# Patient Record
Sex: Female | Born: 1940 | Race: White | Hispanic: No | Smoking: Former smoker
Health system: Southern US, Community
[De-identification: ages and names within clinical notes are randomized; demographics above are authoritative.]

## PROBLEM LIST (undated history)

## (undated) DIAGNOSIS — I341 Nonrheumatic mitral (valve) prolapse: Secondary | ICD-10-CM

## (undated) DIAGNOSIS — E7800 Pure hypercholesterolemia, unspecified: Secondary | ICD-10-CM

## (undated) DIAGNOSIS — K5790 Diverticulosis of intestine, part unspecified, without perforation or abscess without bleeding: Secondary | ICD-10-CM

## (undated) DIAGNOSIS — C189 Malignant neoplasm of colon, unspecified: Secondary | ICD-10-CM

## (undated) DIAGNOSIS — Z836 Family history of other diseases of the respiratory system: Secondary | ICD-10-CM

## (undated) DIAGNOSIS — Z800 Family history of malignant neoplasm of digestive organs: Secondary | ICD-10-CM

## (undated) DIAGNOSIS — H269 Unspecified cataract: Secondary | ICD-10-CM

## (undated) DIAGNOSIS — E119 Type 2 diabetes mellitus without complications: Secondary | ICD-10-CM

## (undated) DIAGNOSIS — D649 Anemia, unspecified: Secondary | ICD-10-CM

## (undated) DIAGNOSIS — C180 Malignant neoplasm of cecum: Secondary | ICD-10-CM

## (undated) DIAGNOSIS — R011 Cardiac murmur, unspecified: Secondary | ICD-10-CM

## (undated) DIAGNOSIS — Z803 Family history of malignant neoplasm of breast: Secondary | ICD-10-CM

## (undated) DIAGNOSIS — M1990 Unspecified osteoarthritis, unspecified site: Secondary | ICD-10-CM

## (undated) DIAGNOSIS — K439 Ventral hernia without obstruction or gangrene: Secondary | ICD-10-CM

## (undated) DIAGNOSIS — I10 Essential (primary) hypertension: Secondary | ICD-10-CM

## (undated) DIAGNOSIS — K769 Liver disease, unspecified: Secondary | ICD-10-CM

## (undated) DIAGNOSIS — E559 Vitamin D deficiency, unspecified: Secondary | ICD-10-CM

## (undated) DIAGNOSIS — Z86010 Personal history of colonic polyps: Secondary | ICD-10-CM

## (undated) DIAGNOSIS — K648 Other hemorrhoids: Secondary | ICD-10-CM

## (undated) DIAGNOSIS — K219 Gastro-esophageal reflux disease without esophagitis: Secondary | ICD-10-CM

## (undated) DIAGNOSIS — G4730 Sleep apnea, unspecified: Secondary | ICD-10-CM

## (undated) HISTORY — DX: Unspecified cataract: H26.9

## (undated) HISTORY — DX: Type 2 diabetes mellitus without complications: E11.9

## (undated) HISTORY — DX: Family history of malignant neoplasm of digestive organs: Z80.0

## (undated) HISTORY — DX: Family history of other diseases of the respiratory system: Z83.6

## (undated) HISTORY — PX: MOUTH SURGERY: SHX715

## (undated) HISTORY — DX: Malignant neoplasm of colon, unspecified: C18.9

## (undated) HISTORY — DX: Sleep apnea, unspecified: G47.30

## (undated) HISTORY — DX: Other hemorrhoids: K64.8

## (undated) HISTORY — PX: VAGINAL HYSTERECTOMY: SUR661

## (undated) HISTORY — DX: Pure hypercholesterolemia, unspecified: E78.00

## (undated) HISTORY — DX: Family history of malignant neoplasm of breast: Z80.3

## (undated) HISTORY — DX: Vitamin D deficiency, unspecified: E55.9

## (undated) HISTORY — DX: Cardiac murmur, unspecified: R01.1

## (undated) HISTORY — PX: COLON SURGERY: SHX602

## (undated) HISTORY — DX: Liver disease, unspecified: K76.9

## (undated) HISTORY — DX: Anemia, unspecified: D64.9

## (undated) HISTORY — PX: COLONOSCOPY: SHX174

## (undated) HISTORY — DX: Nonrheumatic mitral (valve) prolapse: I34.1

## (undated) HISTORY — DX: Ventral hernia without obstruction or gangrene: K43.9

## (undated) HISTORY — DX: Essential (primary) hypertension: I10

## (undated) HISTORY — DX: Gastro-esophageal reflux disease without esophagitis: K21.9

---

## 1993-12-03 HISTORY — PX: DENTAL SURGERY: SHX609

## 1998-03-24 ENCOUNTER — Ambulatory Visit (HOSPITAL_COMMUNITY): Admission: RE | Admit: 1998-03-24 | Discharge: 1998-03-24 | Payer: Self-pay | Admitting: Gastroenterology

## 1998-07-25 ENCOUNTER — Other Ambulatory Visit: Admission: RE | Admit: 1998-07-25 | Discharge: 1998-07-25 | Payer: Self-pay | Admitting: *Deleted

## 1999-06-23 ENCOUNTER — Other Ambulatory Visit: Admission: RE | Admit: 1999-06-23 | Discharge: 1999-06-23 | Payer: Self-pay | Admitting: *Deleted

## 1999-09-29 ENCOUNTER — Encounter: Admission: RE | Admit: 1999-09-29 | Discharge: 1999-09-29 | Payer: Self-pay | Admitting: Family Medicine

## 1999-09-29 ENCOUNTER — Encounter: Payer: Self-pay | Admitting: Family Medicine

## 2000-08-12 ENCOUNTER — Other Ambulatory Visit: Admission: RE | Admit: 2000-08-12 | Discharge: 2000-08-12 | Payer: Self-pay | Admitting: *Deleted

## 2002-01-12 ENCOUNTER — Ambulatory Visit (HOSPITAL_COMMUNITY): Admission: RE | Admit: 2002-01-12 | Discharge: 2002-01-12 | Payer: Self-pay | Admitting: Internal Medicine

## 2002-01-12 ENCOUNTER — Encounter (INDEPENDENT_AMBULATORY_CARE_PROVIDER_SITE_OTHER): Payer: Self-pay

## 2002-06-10 ENCOUNTER — Other Ambulatory Visit: Admission: RE | Admit: 2002-06-10 | Discharge: 2002-06-10 | Payer: Self-pay | Admitting: Radiology

## 2002-06-10 ENCOUNTER — Encounter: Admission: RE | Admit: 2002-06-10 | Discharge: 2002-06-10 | Payer: Self-pay | Admitting: Family Medicine

## 2002-06-10 ENCOUNTER — Encounter: Payer: Self-pay | Admitting: Family Medicine

## 2002-06-10 ENCOUNTER — Encounter (INDEPENDENT_AMBULATORY_CARE_PROVIDER_SITE_OTHER): Payer: Self-pay | Admitting: Specialist

## 2002-06-26 ENCOUNTER — Encounter: Payer: Self-pay | Admitting: Family Medicine

## 2002-06-26 ENCOUNTER — Encounter: Admission: RE | Admit: 2002-06-26 | Discharge: 2002-06-26 | Payer: Self-pay | Admitting: Family Medicine

## 2002-12-23 ENCOUNTER — Encounter: Payer: Self-pay | Admitting: Family Medicine

## 2002-12-23 ENCOUNTER — Encounter: Admission: RE | Admit: 2002-12-23 | Discharge: 2002-12-23 | Payer: Self-pay | Admitting: Family Medicine

## 2003-08-24 ENCOUNTER — Encounter: Payer: Self-pay | Admitting: Family Medicine

## 2003-08-24 ENCOUNTER — Encounter: Admission: RE | Admit: 2003-08-24 | Discharge: 2003-08-24 | Payer: Self-pay | Admitting: Family Medicine

## 2004-10-11 ENCOUNTER — Encounter: Admission: RE | Admit: 2004-10-11 | Discharge: 2004-10-11 | Payer: Self-pay | Admitting: Family Medicine

## 2005-11-06 ENCOUNTER — Encounter: Admission: RE | Admit: 2005-11-06 | Discharge: 2005-11-06 | Payer: Self-pay | Admitting: Family Medicine

## 2006-11-07 ENCOUNTER — Encounter: Admission: RE | Admit: 2006-11-07 | Discharge: 2006-11-07 | Payer: Self-pay | Admitting: Family Medicine

## 2007-04-11 ENCOUNTER — Ambulatory Visit: Payer: Self-pay | Admitting: Internal Medicine

## 2007-04-15 ENCOUNTER — Ambulatory Visit: Payer: Self-pay | Admitting: Internal Medicine

## 2007-04-15 ENCOUNTER — Encounter: Payer: Self-pay | Admitting: Internal Medicine

## 2007-05-14 ENCOUNTER — Ambulatory Visit: Payer: Self-pay | Admitting: Internal Medicine

## 2007-05-14 LAB — CONVERTED CEMR LAB
Fecal Occult Blood: NEGATIVE
OCCULT 1: NEGATIVE
OCCULT 2: NEGATIVE
OCCULT 3: NEGATIVE
OCCULT 4: NEGATIVE
OCCULT 5: NEGATIVE

## 2007-07-25 ENCOUNTER — Ambulatory Visit: Payer: Self-pay | Admitting: Internal Medicine

## 2007-07-25 LAB — CONVERTED CEMR LAB
Basophils Absolute: 0 10*3/uL (ref 0.0–0.1)
Basophils Relative: 0.4 % (ref 0.0–1.0)
Eosinophils Absolute: 0.1 10*3/uL (ref 0.0–0.6)
Eosinophils Relative: 2 % (ref 0.0–5.0)
HCT: 40.8 % (ref 36.0–46.0)
Hemoglobin: 14.2 g/dL (ref 12.0–15.0)
Lymphocytes Relative: 35.2 % (ref 12.0–46.0)
MCHC: 34.8 g/dL (ref 30.0–36.0)
MCV: 91.2 fL (ref 78.0–100.0)
Monocytes Absolute: 0.5 10*3/uL (ref 0.2–0.7)
Monocytes Relative: 8.2 % (ref 3.0–11.0)
Neutro Abs: 3.2 10*3/uL (ref 1.4–7.7)
Neutrophils Relative %: 54.2 % (ref 43.0–77.0)
Platelets: 328 10*3/uL (ref 150–400)
RBC: 4.47 M/uL (ref 3.87–5.11)
RDW: 12.8 % (ref 11.5–14.6)
WBC: 5.9 10*3/uL (ref 4.5–10.5)

## 2007-12-09 ENCOUNTER — Ambulatory Visit (HOSPITAL_COMMUNITY): Admission: RE | Admit: 2007-12-09 | Discharge: 2007-12-09 | Payer: Self-pay | Admitting: Family Medicine

## 2008-12-28 ENCOUNTER — Encounter: Admission: RE | Admit: 2008-12-28 | Discharge: 2008-12-28 | Payer: Self-pay | Admitting: Family Medicine

## 2009-10-07 ENCOUNTER — Encounter: Admission: RE | Admit: 2009-10-07 | Discharge: 2009-10-07 | Payer: Self-pay | Admitting: Family Medicine

## 2010-01-03 ENCOUNTER — Encounter: Admission: RE | Admit: 2010-01-03 | Discharge: 2010-01-03 | Payer: Self-pay | Admitting: Family Medicine

## 2010-11-20 ENCOUNTER — Encounter
Admission: RE | Admit: 2010-11-20 | Discharge: 2010-11-20 | Payer: Self-pay | Source: Home / Self Care | Attending: Family Medicine | Admitting: Family Medicine

## 2010-12-23 ENCOUNTER — Other Ambulatory Visit: Payer: Self-pay | Admitting: Family Medicine

## 2010-12-23 DIAGNOSIS — Z9289 Personal history of other medical treatment: Secondary | ICD-10-CM

## 2010-12-23 DIAGNOSIS — Z1231 Encounter for screening mammogram for malignant neoplasm of breast: Secondary | ICD-10-CM

## 2011-01-12 ENCOUNTER — Ambulatory Visit
Admission: RE | Admit: 2011-01-12 | Discharge: 2011-01-12 | Disposition: A | Discharge status: Home / Self Care | Payer: BC Managed Care – PPO | Source: Ambulatory Visit | Attending: Family Medicine | Admitting: Family Medicine

## 2011-01-12 DIAGNOSIS — Z1231 Encounter for screening mammogram for malignant neoplasm of breast: Secondary | ICD-10-CM

## 2011-04-20 NOTE — Procedures (Signed)
[facility]  Patient:    Linda Grant, Linda Grant Visit Number: [id number] MRN: [id number]          Service Type: END Location: ENDO Attending Physician:  Mervin Hack Dictated by:   Hedwig Morton. Juanda Chance, M.D. [facility] Admit Date:  01/12/2002   CC:         Duncan Dull, M.D.   Procedure Report  PROCEDURE:  Colonoscopy.  INDICATIONS:  This 70 year old white female has a positive family history of colon cancer in her sister who has done well after resection of her cancer. The patient herself had a colonoscopy in 1992, with findings of polyp. Subsequent colonoscopy in 1999, also with polyps.  She has been treated for symptomatic gastroesophageal reflux.  She has also a history of hemorrhoids. This is her third colonoscopy for follow-up of polyps and neoplastic screening.  INSTRUMENT:  Olympus single-channel videoendoscope.  PREMEDICATION:  Versed 7 mg IV, Demerol 70 mg IV.  DESCRIPTION OF PROCEDURE:  The Olympus single-channel videoscope was passed routinely from the rectum into the sigmoid colon.  The patient was monitored by pulse oximetry.  Her oxygen saturations were satisfactory.  Her prep was excellent.  Anal canal and rectal ampulla were unremarkable.  Retroflexion of endoscope in the rectum did not reveal any significant hemorrhoids.  There were three polyps between 15-20 cm from the rectum measuring between 5-7 mm. They were all sessile and were ablated with hot biopsies.  There were numerous diverticula of the sigmoid colon with large hypertrophied haustral folds. Descending colon was normal.  Colonoscope passed easily through the splenic flexure, transverse colon, hepatic flexure, ascending colon to the cecum. Cecal pouch, ileocecal valve were normal.  Colonoscope was then retracted, colon decompressed.  Postpolypectomy sites were inspected and appeared not to be bleeding.  The patient tolerated the procedure well.  IMPRESSION: 1. Left  colon polyp, status post multiple polypectomies x3. 2. Diverticulosis of the left colon.  PLAN: 1. Postpolypectomy instructions which will include holding her aspirin for    two weeks. 2. High-fiber diet. 3. Recall colonoscopy depending on the polyp histology, probably within the    next three years. 4. Yearly Hemoccult cards. Dictated by:   Hedwig Morton. Juanda Chance, M.D. [facility] Attending Physician:  Mervin Hack DD:  01/12/02 TD:  01/12/02 Job: [id number]

## 2011-12-10 ENCOUNTER — Other Ambulatory Visit: Payer: Self-pay | Admitting: Family Medicine

## 2011-12-10 DIAGNOSIS — Z1231 Encounter for screening mammogram for malignant neoplasm of breast: Secondary | ICD-10-CM

## 2012-01-15 ENCOUNTER — Ambulatory Visit
Admission: RE | Admit: 2012-01-15 | Discharge: 2012-01-15 | Disposition: A | Discharge status: Home / Self Care | Payer: Medicare Other | Source: Ambulatory Visit | Attending: Family Medicine | Admitting: Family Medicine

## 2012-01-15 DIAGNOSIS — Z1231 Encounter for screening mammogram for malignant neoplasm of breast: Secondary | ICD-10-CM

## 2012-06-30 ENCOUNTER — Ambulatory Visit (INDEPENDENT_AMBULATORY_CARE_PROVIDER_SITE_OTHER): Payer: Medicare Other | Admitting: Gastroenterology

## 2012-06-30 ENCOUNTER — Encounter: Payer: Self-pay | Admitting: Gastroenterology

## 2012-06-30 VITALS — BP 146/76 | HR 100 | Ht 60.0 in | Wt 153.4 lb

## 2012-06-30 DIAGNOSIS — Z800 Family history of malignant neoplasm of digestive organs: Secondary | ICD-10-CM

## 2012-06-30 DIAGNOSIS — E119 Type 2 diabetes mellitus without complications: Secondary | ICD-10-CM

## 2012-06-30 DIAGNOSIS — K625 Hemorrhage of anus and rectum: Secondary | ICD-10-CM

## 2012-06-30 MED ORDER — MOVIPREP 100 G PO SOLR: 1.0000 | Freq: Once | ORAL | Status: DC

## 2012-06-30 NOTE — Assessment & Plan Note (Signed)
Continue colonoscopy surveillance every 5 years

## 2012-06-30 NOTE — Progress Notes (Signed)
History of Present Illness: Linda Grant 71 year old white female seen as an emergency work in for rectal bleeding. She has a history of diverticulosis and was last examined 5 years ago. Small non-adenomatous polyps were removed.  Yesterday she developed rectal bleeding consisting of blood mixed with her poorly formed stools. She had 2 bowel movements yesterday. Today she has solid bowel movement with a small amount of blood coating the stool. There's been no prior change in bowel habits. She has minimal lower abdominal discomfort.  She's on no gastric irritants including nonsteroidals. She denies weakness or dizziness.  Family history is pertinent for a sister who had colon cancer age 76.    Past Medical History  Diagnosis Date  . Diabetes mellitus     type 2  . Elevated cholesterol   . Hypertension   . GERD (gastroesophageal reflux disease)   . Vitamin d deficiency   . History of colon polyps     hyperplastic   Past Surgical History  Procedure Date  . Vaginal hysterectomy   . Mouth surgery     dental implants   family history includes Colon cancer (age of onset:37) in her sister; Diabetes in her brother; Diabetes (age of onset:73) in her brother; Heart disease in her brother; and Kidney disease in her brother. Current Outpatient Prescriptions  Medication Sig Dispense Refill  . aspirin 81 MG tablet Take 81 mg by mouth daily.      . Cholecalciferol (VITAMIN D) 2000 UNITS CAPS Take by mouth 1 day or 1 dose.      . ferrous sulfate 325 (65 FE) MG tablet Take 325 mg by mouth daily with breakfast.      . folic acid (FOLVITE) 1 MG tablet Take 1 mg by mouth daily.      Marland Kitchen glucosamine-chondroitin 500-400 MG tablet Take 1 tablet by mouth 3 (three) times daily.      Marland Kitchen lisinopril (PRINIVIL,ZESTRIL) 20 MG tablet Take 20 mg by mouth daily.      Marland Kitchen lovastatin (MEVACOR) 40 MG tablet Take 40 mg by mouth at bedtime.      . Multiple Vitamin (MULTIVITAMIN) tablet Take 1 tablet by mouth daily.      Marland Kitchen  omeprazole (PRILOSEC) 20 MG capsule Take 20 mg by mouth daily.      . polycarbophil (FIBERCON) 625 MG tablet Take 625 mg by mouth daily.       Allergies as of 06/30/2012 - Review Complete 06/30/2012  Allergen Reaction Noted  . Penicillins Anaphylaxis 06/30/2012    reports that she has quit smoking. She has never used smokeless tobacco. She reports that she drinks alcohol. She reports that she does not use illicit drugs.     Review of Systems: Pertinent positive and negative review of systems were noted in the above HPI section. All other review of systems were otherwise negative.  Vital signs were reviewed in today's medical record Physical Exam: General: Well developed , well nourished, no acute distress Head: Normocephalic and atraumatic Eyes:  sclerae anicteric, EOMI Ears: Normal auditory acuity Mouth: No deformity or lesions Neck: Supple, no masses or thyromegaly Lungs: Clear throughout to auscultation Heart: Regular rate and rhythm; no murmurs, rubs or bruits Abdomen: Soft, non tender and non distended. No masses, hepatosplenomegaly or hernias noted. Normal Bowel sounds Rectal: There are no masses. Stool is dark colored and Hemoccult positive Musculoskeletal: Symmetrical with no gross deformities  Skin: No lesions on visible extremities Pulses:  Normal pulses noted Extremities: No clubbing, cyanosis, edema or deformities  noted Neurological: Alert oriented x 4, grossly nonfocal Cervical Nodes:  No significant cervical adenopathy Inguinal Nodes: No significant inguinal adenopathy Psychological:  Alert and cooperative. Normal mood and affect

## 2012-06-30 NOTE — Patient Instructions (Addendum)
Colonoscopy A colonoscopy is an exam to evaluate your entire colon. In this exam, your colon is cleansed. A long fiberoptic tube is inserted through your rectum and into your colon. The fiberoptic scope (endoscope) is a long bundle of enclosed and very flexible fibers. These fibers transmit light to the area examined and send images from that area to your caregiver. Discomfort is usually minimal. You may be given a drug to help you sleep (sedative) during or prior to the procedure. This exam helps to detect lumps (tumors), polyps, inflammation, and areas of bleeding. Your caregiver may also take a small piece of tissue (biopsy) that will be examined under a microscope. LET YOUR CAREGIVER KNOW ABOUT:   Allergies to food or medicine.   Medicines taken, including vitamins, herbs, eyedrops, over-the-counter medicines, and creams.   Use of steroids (by mouth or creams).   Previous problems with anesthetics or numbing medicines.   History of bleeding problems or blood clots.   Previous surgery.   Other health problems, including diabetes and kidney problems.   Possibility of pregnancy, if this applies.  BEFORE THE PROCEDURE   A clear liquid diet may be required for 2 days before the exam.   Ask your caregiver about changing or stopping your regular medications.   Liquid injections (enemas) or laxatives may be required.   A large amount of electrolyte solution may be given to you to drink over a short period of time. This solution is used to clean out your colon.   You should be present 60 minutes prior to your procedure or as directed by your caregiver.  AFTER THE PROCEDURE   If you received a sedative or pain relieving medication, you will need to arrange for someone to drive you home.   Occasionally, there is a little blood passed with the first bowel movement. Do not be concerned.  FINDING OUT THE RESULTS OF YOUR TEST Not all test results are available during your visit. If your test  results are not back during the visit, make an appointment with your caregiver to find out the results. Do not assume everything is normal if you have not heard from your caregiver or the medical facility. It is important for you to follow up on all of your test results. HOME CARE INSTRUCTIONS   It is not unusual to pass moderate amounts of gas and experience mild abdominal cramping following the procedure. This is due to air being used to inflate your colon during the exam. Walking or a warm pack on your belly (abdomen) may help.   You may resume all normal meals and activities after sedatives and medicines have worn off.   Only take over-the-counter or prescription medicines for pain, discomfort, or fever as directed by your caregiver. Do not use aspirin or blood thinners if a biopsy was taken. Consult your caregiver for medicine usage if biopsies were taken.  SEEK IMMEDIATE MEDICAL CARE IF:   You have a fever.   You pass large blood clots or fill a toilet with blood following the procedure. This may also occur 10 to 14 days following the procedure. This is more likely if a biopsy was taken.   You develop abdominal pain that keeps getting worse and cannot be relieved with medicine.  Document Released: 11/16/2000 Document Revised: 11/08/2011 Document Reviewed: 07/01/2008 Exit[employer] Patient Information 2012 [employer]. 

## 2012-06-30 NOTE — Assessment & Plan Note (Addendum)
The patient has had what sounds like a limited lower GI bleeding which appears to be subsiding. This may represent a diverticular bleed. Bleeding from a neoplasm, polyps or hemorrhoids are also possibilities.  Recommendations #1 check CBC-this has  been drawn in Dr. Kevan Ny office #2 colonoscopy. This will be scheduled in the next 2 days.

## 2012-07-01 ENCOUNTER — Encounter: Payer: Self-pay | Admitting: Gastroenterology

## 2012-07-02 ENCOUNTER — Ambulatory Visit (AMBULATORY_SURGERY_CENTER): Payer: Medicare Other | Admitting: Internal Medicine

## 2012-07-02 ENCOUNTER — Encounter: Payer: Self-pay | Admitting: Internal Medicine

## 2012-07-02 ENCOUNTER — Other Ambulatory Visit (INDEPENDENT_AMBULATORY_CARE_PROVIDER_SITE_OTHER): Payer: Medicare Other

## 2012-07-02 ENCOUNTER — Other Ambulatory Visit: Payer: Self-pay | Admitting: *Deleted

## 2012-07-02 VITALS — BP 127/60 | HR 85 | Temp 97.9°F | Resp 12 | Ht 60.0 in | Wt 153.0 lb

## 2012-07-02 DIAGNOSIS — C189 Malignant neoplasm of colon, unspecified: Secondary | ICD-10-CM

## 2012-07-02 DIAGNOSIS — K6389 Other specified diseases of intestine: Secondary | ICD-10-CM

## 2012-07-02 DIAGNOSIS — K625 Hemorrhage of anus and rectum: Secondary | ICD-10-CM

## 2012-07-02 DIAGNOSIS — Z86010 Personal history of colonic polyps: Secondary | ICD-10-CM

## 2012-07-02 DIAGNOSIS — C801 Malignant (primary) neoplasm, unspecified: Secondary | ICD-10-CM

## 2012-07-02 DIAGNOSIS — D490 Neoplasm of unspecified behavior of digestive system: Secondary | ICD-10-CM

## 2012-07-02 DIAGNOSIS — C180 Malignant neoplasm of cecum: Secondary | ICD-10-CM

## 2012-07-02 DIAGNOSIS — Z800 Family history of malignant neoplasm of digestive organs: Secondary | ICD-10-CM

## 2012-07-02 DIAGNOSIS — Z860101 Personal history of adenomatous and serrated colon polyps: Secondary | ICD-10-CM

## 2012-07-02 DIAGNOSIS — D126 Benign neoplasm of colon, unspecified: Secondary | ICD-10-CM

## 2012-07-02 HISTORY — DX: Personal history of adenomatous and serrated colon polyps: Z86.0101

## 2012-07-02 HISTORY — DX: Malignant neoplasm of cecum: C18.0

## 2012-07-02 HISTORY — DX: Personal history of colonic polyps: Z86.010

## 2012-07-02 LAB — COMPREHENSIVE METABOLIC PANEL
AST: 30 U/L (ref 0–37)
Albumin: 4 g/dL (ref 3.5–5.2)
BUN: 8 mg/dL (ref 6–23)
CO2: 26 mEq/L (ref 19–32)
Calcium: 8.9 mg/dL (ref 8.4–10.5)
Chloride: 106 mEq/L (ref 96–112)
Creatinine, Ser: 0.6 mg/dL (ref 0.4–1.2)
GFR: 101.42 mL/min (ref >60.00)
Glucose, Bld: 113 mg/dL — ABNORMAL HIGH (ref 70–99)
Potassium: 4 mEq/L (ref 3.5–5.1)

## 2012-07-02 LAB — CBC WITH DIFFERENTIAL/PLATELET
Basophils Relative: 0.5 % (ref 0.0–3.0)
Eosinophils Relative: 2.2 % (ref 0.0–5.0)
HCT: 33.3 % — ABNORMAL LOW (ref 36.0–46.0)
Hemoglobin: 10.7 g/dL — ABNORMAL LOW (ref 12.0–15.0)
Lymphocytes Relative: 20.5 % (ref 12.0–46.0)
Lymphs Abs: 1.2 10*3/uL (ref 0.7–4.0)
Monocytes Relative: 6 % (ref 3.0–12.0)
Neutro Abs: 4.1 10*3/uL (ref 1.4–7.7)
RBC: 3.88 Mil/uL (ref 3.87–5.11)
RDW: 15.9 % — ABNORMAL HIGH (ref 11.5–14.6)
WBC: 5.9 10*3/uL (ref 4.5–10.5)

## 2012-07-02 LAB — PROTIME-INR: INR: 1.1 ratio — ABNORMAL HIGH (ref 0.8–1.0)

## 2012-07-02 MED ORDER — SODIUM CHLORIDE 0.9 % IV SOLN: 500.0000 mL | INTRAVENOUS | Status: DC

## 2012-07-02 NOTE — Op Note (Signed)
[facility] [address] [place], [place]  [address]  COLONOSCOPY PROCEDURE REPORT  PATIENT:  Linda Grant, Linda Grant  MR#:  [id number] BIRTHDATE:  [birth year], 71 yrs. old  GENDER:  female ENDOSCOPIST:  Hedwig Morton. Juanda Chance, MD REF. BY:  Shaune Pollack, M.D. PROCEDURE DATE:  07/02/2012 PROCEDURE:  Colonoscopy with biopsy ASA CLASS:  Class II INDICATIONS:  family history of colon cancer, history of polyps sister with colon cancercolon polyps [id number],, last colon 2003  MEDICATIONS:   MAC sedation, administered by CRNA, propofol (Diprivan) 100 mg  DESCRIPTION OF PROCEDURE:   After the risks and benefits and of the procedure were explained, informed consent was obtained. Digital rectal exam was performed and revealed no rectal masses. The LB [id number]-[id number] endoscope was introduced through the anus and advanced to the cecum, which was identified by both the appendix and ileocecal valve.  The quality of the prep was good, using MoviPrep.  The instrument was then slowly withdrawn as the colon was fully examined. <<PROCEDUREIMAGES>>  FINDINGS:  A mass was found in the cecum. distal to the ileocecal valve, circumferential, non obstructing mass,friable, firm, With standard forceps, biopsy was obtained and sent to pathology (see image12, image11, image10, image9, image8, image7, and image6).  A sessile polyp was found in the cecum. The polyp was removed using cold biopsy forceps (see image3).  Moderate diverticulosis was found in the sigmoid colon (see image13, image1, and image2). This was otherwise a normal examination of the colon (see image5, image4, and image14).   Retroflexed views in the rectum revealed no abnormalities.    The scope was then withdrawn from the patient and the procedure completed.  COMPLICATIONS:  None ENDOSCOPIC IMPRESSION: 1) Mass in the cecum 2) Sessile polyp in the cecum 3) Moderate diverticulosis in the sigmoid colon 4) Otherwise normal  examination cecal mass c/w carcinoma, s/p biopsies RECOMMENDATIONS: 1) Await pathology results CT scan of the abdomen and pelvis with IV and oral contrast CBC,CEA,C-met, Protime today OVisit 2 days  REPEAT EXAM:  In 1 year(s) for.  ______________________________ Hedwig Morton. Juanda Chance, MD  CC:  n. eSIGNED:   Hedwig Morton. Quantina Dershem at 07/02/2012 11:20 AM  Chinita Greenland, [phone]

## 2012-07-02 NOTE — Progress Notes (Signed)
Patient did not experience any of the following events: a burn prior to discharge; a fall within the facility; wrong site/side/patient/procedure/implant event; or a hospital transfer or hospital admission upon discharge from the facility. (G8907) Patient did not have preoperative order for IV antibiotic SSI prophylaxis. (G8918)  

## 2012-07-02 NOTE — Patient Instructions (Addendum)
YOU HAD AN ENDOSCOPIC PROCEDURE TODAY AT THE [facility]: Refer to the procedure report that was given to you for any specific questions about what was found during the examination.  If the procedure report does not answer your questions, please call your gastroenterologist to clarify.  If you requested that your care partner not be given the details of your procedure findings, then the procedure report has been included in a sealed envelope for you to review at your convenience later.  YOU SHOULD EXPECT: Some feelings of bloating in the abdomen. Passage of more gas than usual.  Walking can help get rid of the air that was put into your GI tract during the procedure and reduce the bloating. If you had a lower endoscopy (such as a colonoscopy or flexible sigmoidoscopy) you may notice spotting of blood in your stool or on the toilet paper. If you underwent a bowel prep for your procedure, then you may not have a normal bowel movement for a few days.  DIET: Your first meal following the procedure should be a light meal and then it is ok to progress to your normal diet.  A half-sandwich or bowl of soup is an example of a good first meal.  Heavy or fried foods are harder to digest and may make you feel nauseous or bloated.  Likewise meals heavy in dairy and vegetables can cause extra gas to form and this can also increase the bloating.  Drink plenty of fluids but you should avoid alcoholic beverages for 24 hours.  ACTIVITY: Your care partner should take you home directly after the procedure.  You should plan to take it easy, moving slowly for the rest of the day.  You can resume normal activity the day after the procedure however you should NOT DRIVE or use heavy machinery for 24 hours (because of the sedation medicines used during the test).    SYMPTOMS TO REPORT IMMEDIATELY: A gastroenterologist can be reached at any hour.  During normal business hours, 8:30 AM to 5:00 PM Monday through Friday,  call [phone].  After hours and on weekends, please call the GI answering service at [phone] who will take a message and have the physician on call contact you.   Following lower endoscopy (colonoscopy or flexible sigmoidoscopy):  Excessive amounts of blood in the stool  Significant tenderness or worsening of abdominal pains  Swelling of the abdomen that is new, acute  Fever of 100F or higher   FOLLOW UP: If any biopsies were taken you will be contacted by phone or by letter within the next 1-3 weeks.  Call your gastroenterologist if you have not heard about the biopsies in 3 weeks.  Our staff will call the home number listed on your records the next business day following your procedure to check on you and address any questions or concerns that you may have at that time regarding the information given to you following your procedure. This is a courtesy call and so if there is no answer at the home number and we have not heard from you through the emergency physician on call, we will assume that you have returned to your regular daily activities without incident.  SIGNATURES/CONFIDENTIALITY: You and/or your care partner have signed paperwork which will be entered into your electronic medical record.  These signatures attest to the fact that that the information above on your After Visit Summary has been reviewed and is understood.  Full responsibility of the confidentiality of   this discharge information lies with you and/or your care-partner.   Resume your normal medications 

## 2012-07-03 ENCOUNTER — Telehealth: Payer: Self-pay | Admitting: Gastroenterology

## 2012-07-03 ENCOUNTER — Telehealth: Payer: Self-pay

## 2012-07-03 ENCOUNTER — Ambulatory Visit (INDEPENDENT_AMBULATORY_CARE_PROVIDER_SITE_OTHER)
Admission: RE | Admit: 2012-07-03 | Discharge: 2012-07-03 | Disposition: A | Discharge status: Home / Self Care | Payer: Medicare Other | Source: Ambulatory Visit | Attending: Internal Medicine | Admitting: Internal Medicine

## 2012-07-03 ENCOUNTER — Encounter: Payer: Self-pay | Admitting: *Deleted

## 2012-07-03 DIAGNOSIS — K625 Hemorrhage of anus and rectum: Secondary | ICD-10-CM

## 2012-07-03 DIAGNOSIS — K6389 Other specified diseases of intestine: Secondary | ICD-10-CM

## 2012-07-03 HISTORY — DX: Malignant neoplasm of cecum: C18.0

## 2012-07-03 HISTORY — DX: Personal history of colonic polyps: Z86.010

## 2012-07-03 HISTORY — DX: Diverticulosis of intestine, part unspecified, without perforation or abscess without bleeding: K57.90

## 2012-07-03 MED ORDER — IOHEXOL 300 MG/ML  SOLN
100.0000 mL | Freq: Once | INTRAMUSCULAR | Status: AC | PRN
  Administered 2012-07-03: 100 mL via INTRAVENOUS

## 2012-07-03 NOTE — Telephone Encounter (Signed)
Forward  5 pages from [facility] to Dr. Melvia Heaps for review on 07-03-12 ym

## 2012-07-03 NOTE — Telephone Encounter (Signed)
  Follow up Call-  Call back number 07/02/2012  Post procedure Call Back phone  # [phone]  Permission to leave phone message Yes     Patient questions:  Do you have a fever, pain , or abdominal swelling? no Pain Score  0 *  Have you tolerated food without any problems? yes  Have you been able to return to your normal activities? yes  Do you have any questions about your discharge instructions: Diet   no Medications  no Follow up visit  no  Do you have questions or concerns about your Care? no  Actions: * If pain score is 4 or above: No action needed, pain <4.

## 2012-07-04 ENCOUNTER — Encounter: Payer: Self-pay | Admitting: Internal Medicine

## 2012-07-04 ENCOUNTER — Ambulatory Visit (INDEPENDENT_AMBULATORY_CARE_PROVIDER_SITE_OTHER): Payer: Medicare Other | Admitting: Internal Medicine

## 2012-07-04 ENCOUNTER — Other Ambulatory Visit: Payer: Medicare Other

## 2012-07-04 VITALS — BP 120/68 | HR 107 | Ht 60.0 in | Wt 152.4 lb

## 2012-07-04 DIAGNOSIS — D509 Iron deficiency anemia, unspecified: Secondary | ICD-10-CM

## 2012-07-04 DIAGNOSIS — C180 Malignant neoplasm of cecum: Secondary | ICD-10-CM

## 2012-07-04 NOTE — Progress Notes (Signed)
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 The lesion was circumferential but not obstructing. A CT scan of the abdomen shows thickening of the cecal wall and one 9 mm lymph node but no evidence of metastatic disease. Her CEA level is 1.0. Her hemoglobin is low at  10 g. We have arranged for surgical evaluation for her next week. She has a strong family history of colon cancer in her sister who had disease at age 37 and is alive at age 69. Patient had prior colonoscopies in 1992, 2003 and May 2008. She had iron deficiency in April 2008.   Past Medical History  Diagnosis Date  . Diabetes mellitus     type 2  . Elevated cholesterol   . Hypertension   . GERD (gastroesophageal reflux disease)   . Vitamin d deficiency   . Hyperplastic colon polyp   . Diverticulosis   . 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 Hx of adenomatous colonic polyps 07/02/12   Past Surgical History  Procedure Date  . Vaginal hysterectomy   . Mouth surgery     dental implants    reports that she has never smoked. She has never used smokeless tobacco. She reports that she drinks alcohol. She reports that she does not use illicit drugs. family history includes Colon cancer (age of onset:37) in her sister; Diabetes in her brother; Diabetes (age of onset:73) in her brother; Heart disease in her brother; and Kidney disease in her brother. Allergies  Allergen Reactions  . Penicillins Anaphylaxis        Review of Systems: Denies upper GI symptoms of heartburn chest pain  The remainder of the 10 point ROS is negative except as outlined in H&P   Physical Exam: General appearance  Well developed, in no distress.  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 Patient is anemic but is otherwise asymptomatic. She has a referral to a surgeon for surgical resection next week. It will be up to the surgeon to decide if laparoscopically assisted or open exploration is indicated. She will start taking iron supplements right away. Her appointment at [facility] is on August 8.   07/04/2012 [name]

## 2012-07-04 NOTE — Patient Instructions (Addendum)
You have been scheduled for an appointment with Dr Mignon Pine at [facility]. Your appointment is on 07/10/12 at 2:20 pm. Please arrive at 1:50 pm for registration. Make certain to bring a list of current medications, including any over the counter medications or vitamins. Also bring your co-pay if you have one as well as your insurance cards. [facility] is located at [address]. Should you need to reschedule your appointment, please contact them at [phone]. CC: Dr Mignon Pine, Dr Shaune Pollack

## 2012-07-05 ENCOUNTER — Encounter: Payer: Self-pay | Admitting: Internal Medicine

## 2012-07-10 ENCOUNTER — Ambulatory Visit (INDEPENDENT_AMBULATORY_CARE_PROVIDER_SITE_OTHER): Payer: Medicare Other | Admitting: Surgery

## 2012-07-10 ENCOUNTER — Encounter (INDEPENDENT_AMBULATORY_CARE_PROVIDER_SITE_OTHER): Payer: Self-pay | Admitting: Surgery

## 2012-07-10 VITALS — BP 132/74 | HR 70 | Temp 97.8°F | Resp 14 | Ht 60.0 in | Wt 153.1 lb

## 2012-07-10 DIAGNOSIS — C189 Malignant neoplasm of colon, unspecified: Secondary | ICD-10-CM | POA: Insufficient documentation

## 2012-07-10 NOTE — Progress Notes (Signed)
Patient ID: Linda Grant, female   DOB: 31-Dec-1940, 71 y.o.   MRN: [id number]  Chief Complaint  Patient presents with  . New Evaluation    Adenocarcinoma of cecum    HPI NAEEMA Grant is a 71 y.o. female.  Patient is sent at the request of Dr. Dickie La to to a mass in her colon located at the cecum. She underwent colonoscopy which revealed a mass in the cecum biopsy-proven to be adenocarcinoma. She is with chronic anemia with a hemoglobin of 10 and complains of fatigue. Denies any significant abdominal pain, nausea or vomiting. No blood in her stool or change in her dietary habits. She has no bloating. She has been under stress due to family issues. HPI  Past Medical History  Diagnosis Date  . Elevated cholesterol   . Hypertension   . GERD (gastroesophageal reflux disease)   . Vitamin d deficiency   . Hyperplastic colon polyp   . Diverticulosis   . Hx of adenomatous colonic polyps 07/02/12  . Anemia   . 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 Diabetes mellitus     type 2 - controlled  . Heart murmur   . Mitral valve prolapse   . Rectal bleeding   . Blood in stool     Past Surgical History  Procedure Date  . Vaginal hysterectomy   . Mouth surgery     dental implants    Family History  Problem Relation Age of Onset  . Colon cancer Sister 62  . Cancer Sister     breast  . Diabetes Brother     deceased  . Diabetes Brother 23  . Heart disease Brother   . Kidney disease Brother     deceased  . Heart disease Father   . Cancer Sister     lung    Social History History  Substance Use Topics  . Smoking status: Former Smoker    Quit date: 07/10/1982  . Smokeless tobacco: Never Used  . Alcohol Use: Yes     1 to 2 per day    Allergies  Allergen Reactions  . Penicillins Anaphylaxis    Current Outpatient Prescriptions  Medication Sig Dispense Refill  . aspirin 81 MG tablet Take 81 mg by mouth daily.      . Cholecalciferol (VITAMIN D) 2000 UNITS CAPS Take by mouth  1 day or 1 dose.      . ferrous sulfate 325 (65 FE) MG tablet Take 325 mg by mouth daily with breakfast.      . folic acid (FOLVITE) 1 MG tablet Take 1 mg by mouth daily.      Marland Kitchen lisinopril (PRINIVIL,ZESTRIL) 20 MG tablet Take 20 mg by mouth daily.      Marland Kitchen lovastatin (MEVACOR) 40 MG tablet Take 40 mg by mouth at bedtime.      Marland Kitchen omeprazole (PRILOSEC) 20 MG capsule Take 20 mg by mouth daily.        Review of Systems Review of Systems  Constitutional: Positive for fatigue. Negative for fever, chills and unexpected weight change.  HENT: Negative for hearing loss, congestion, sore throat, trouble swallowing and voice change.   Eyes: Negative for visual disturbance.  Respiratory: Negative for cough and wheezing.   Cardiovascular: Negative for chest pain, palpitations and leg swelling.  Gastrointestinal: Negative for nausea, vomiting, abdominal pain, diarrhea, constipation, blood in stool, abdominal distention and anal bleeding.  Genitourinary: Negative for hematuria, vaginal bleeding and difficulty urinating.  Musculoskeletal: Negative for arthralgias.  Skin: Negative for rash and wound.  Neurological: Negative for seizures, syncope and headaches.  Hematological: Negative for adenopathy. Does not bruise/bleed easily.  Psychiatric/Behavioral: Positive for dysphoric mood. Negative for confusion.    Blood pressure 132/74, pulse 70, temperature 97.8 F (36.6 C), temperature source Temporal, resp. rate 14, height 5' (1.524 m), weight 153 lb 2 oz (69.457 kg).  Physical Exam Physical Exam  Constitutional: She is oriented to person, place, and time. She appears well-developed and well-nourished.  HENT:  Head: Normocephalic and atraumatic.  Eyes: EOM are normal. Pupils are equal, round, and reactive to light.  Neck: Normal range of motion. Neck supple.  Cardiovascular: Normal rate and regular rhythm.   Pulmonary/Chest: Effort normal and breath sounds normal.  Abdominal: Soft. Bowel sounds are  normal. She exhibits no distension and no mass. There is no tenderness. There is no rebound and no guarding.  Musculoskeletal: Normal range of motion.  Neurological: She is alert and oriented to person, place, and time.  Skin: Skin is warm and dry.  Psychiatric: She has a normal mood and affect. Her behavior is normal. Judgment and thought content normal.    Data Reviewed Mild eccentric wall thickening in the cecum, likely corresponding  to the biopsy proven colonic adenocarcinoma.  Surrounding pericolonic stranding/inflammatory changes. Associated  9 mm short axis ileocolic lymph node.  No evidence of distant metastasis in the abdomen/pelvis. COLONOSCOPY  CECAL MASS BX PROVEN ADENOCARCINOMA  CEA NORMAL HGB 10  Assessment    Carcinoma of cecum    Plan    Laparoscopic assisted partial colectomy.The procedure was discussed with the patient.  Laparoscopic partial colectomy discussed with the patient as well as non operative treatments. The risks of operative management include bleeding,  Infection,  Leak of anastamosis,  Ostomy formation, open procedure,  Sepsis,  Abcess,  Hernia,  DVT,  Pulmonary complications,  Cardiovascular  complications,  Injury to ureter,  Bladder,kidney,and anesthesia risks,  And death. The patient understands.  Questions answered.   The success of the procedure is 50-100 % for treating the patients symptoms. They agree to proceed.       Linda Grant A. 07/10/2012, 3:21 PM

## 2012-07-10 NOTE — Patient Instructions (Signed)
Laparoscopic Colon Resection Laparoscopic colon resection is a relatively new procedure and is not performed in all centers. It may be done to remove a piece of the colon (large intestine) that may be sore and reddened (inflamed). It may be done to remove a portion of bowel that is blocked. The intestine may be blocked because of colon cancer. It is sometimes used to treat diseases of the bowel in which there are multiple small outgrowths from the bowel wall (polyps), which may predispose a person to cancer. LET YOUR CAREGIVER KNOW ABOUT:  Allergies.   Medications taken including herbs, eye drops, over the counter medications, and creams.   Use of steroids (by mouth or creams).   Previous problems with anesthetics or novocaine.   Possibility of pregnancy, if this applies.   History of blood clots (thrombophlebitis).   History of bleeding or blood problems.   Previous surgery.   Other health problems.  RISKS AND COMPLICATIONS Some problems, which occur following this procedure, include:  Infection: A germ starts growing in the wound. This can usually be treated with medicine that kills germs (antibiotics).   Bleeding following surgery may be a complication of almost all surgeries. Your surgeon takes every precaution to keep this from happening.   Damage to other organs may occur. If damage to other organs or excessive bleeding should occur it may be necessary to convert the laparoscopic procedure into an open abdominal (belly) procedure. This means the surgery is performed by opening the abdomen and performing the surgery under direct vision. Scarring from previous surgeries or disease may also be a cause to change this procedure to an open abdominal operation.   Sometimes a leak can occur in the line where the bowel was sewn together after the portion of bowel was removed.   It is possible for the bowel to become obstructed in the area where it was sewn together. When this happens, it  is sometimes necessary to operate again to repair this. This may be accomplished using the laparoscope or opening the abdomen and operating in the usual manner without the laparoscope.  BEFORE THE PROCEDURE You should be present 2 hours prior to your procedure or as instructed.  PROCEDURE  Laparoscopic means a laparoscope (a small pencil sized telescope) is used. You are made to sleep with medicine (anesthetized). Your surgeon inflates your belly (abdomen) with a needle like device (trocar and cannula). The inflation is done with a harmless gas (carbon dioxide). This makes your organs easier to see. The laparoscope is inserted into your abdomen through a small slit (incision) that allows your surgeon to see into the abdomen. Other small instruments, such as probes and operating instruments, are inserted into the abdomen through other small openings (ports). These ports allow the surgeon to perform the operation. Often surgeons attach a video camera to the laparoscope to enlarge the view. During the procedure the portion of bowel to be removed is taken out through one of the ports. A port may have to be enlarged if the bowel is too large to be removed. In this case a small incision will be made and some times the bowel is reconnected (anastamosis) outside the abdomen. After the procedure, the gas is released, and your incisions are closed with stitches (sutures). Because these incisions are small (usually less than one-half inch), there is usually minimal discomfort following the procedure. AFTER THE PROCEDURE The recovery time, if there are no problems, is shortened compared to regular surgery. You will rest in   a recovery room until you are stable and doing well. Following this, barring other problems you will be allowed to return to your room. Recovery times vary depending on what is found at surgery, the age of the patient, general health, etc. SEEK IMMEDIATE MEDICAL CARE IF:   There is redness, swelling,  or increasing pain in the wound area.   Pus is coming from the wound.   An unexplained oral temperature above 102 F (38.9 C) develops or as directed.   You notice a foul smell coming from the wound or dressing.   There is a breaking open of a wound (edges not staying together) after sutures have been removed.   You develop increasing abdominal pain.  Document Released: 02/09/2003 Document Revised: 11/08/2011 Document Reviewed: 12/19/2007 [employer] Patient Information 2012 [employer]. 

## 2012-07-11 ENCOUNTER — Encounter (HOSPITAL_COMMUNITY): Payer: Self-pay | Admitting: Pharmacy Technician

## 2012-07-21 ENCOUNTER — Encounter: Payer: Self-pay | Admitting: *Deleted

## 2012-07-22 NOTE — Pre-Procedure Instructions (Signed)
20 Linda Grant  07/22/2012   Your procedure is scheduled on:  Mon, Aug 26 @ 11:30 AM  Report to [facility] at 9:30 AM.  Call this number if you have problems the morning of surgery: [phone]   Remember:   Do not eat food:After Midnight.  Take these medicines the morning of surgery with A SIP OF WATER: Omeprazole(Prilosec)   Do not wear jewelry, make-up or nail polish.  Do not wear lotions, powders, or perfumes.   Do not shave 48 hours prior to surgery.   Do not bring valuables to the hospital.  Contacts, dentures or bridgework may not be worn into surgery.  Leave suitcase in the car. After surgery it may be brought to your room.  For patients admitted to the hospital, checkout time is 11:00 AM the day of discharge.   Patients discharged the day of surgery will not be allowed to drive home.  Special Instructions: CHG Shower Use Special Wash: 1/2 bottle night before surgery and 1/2 bottle morning of surgery.   Please read over the following fact sheets that you were given:

## 2012-07-23 ENCOUNTER — Encounter (HOSPITAL_COMMUNITY)
Admission: RE | Admit: 2012-07-23 | Discharge: 2012-07-23 | Disposition: A | Discharge status: Home / Self Care | Payer: Medicare Other | Source: Ambulatory Visit | Attending: Surgery | Admitting: Surgery

## 2012-07-23 ENCOUNTER — Encounter (HOSPITAL_COMMUNITY): Payer: Self-pay

## 2012-07-23 HISTORY — DX: Unspecified osteoarthritis, unspecified site: M19.90

## 2012-07-23 LAB — COMPREHENSIVE METABOLIC PANEL
ALT: 14 U/L (ref 0–35)
AST: 14 U/L (ref 0–37)
Albumin: 3.5 g/dL (ref 3.5–5.2)
CO2: 27 mEq/L (ref 19–32)
Calcium: 9.3 mg/dL (ref 8.4–10.5)
Chloride: 104 mEq/L (ref 96–112)
Creatinine, Ser: 0.64 mg/dL (ref 0.50–1.10)
GFR calc non Af Amer: 89 mL/min — ABNORMAL LOW (ref >90)
Sodium: 140 mEq/L (ref 135–145)

## 2012-07-23 LAB — CBC WITH DIFFERENTIAL/PLATELET
Basophils Absolute: 0 10*3/uL (ref 0.0–0.1)
Basophils Relative: 1 % (ref 0–1)
Lymphocytes Relative: 21 % (ref 12–46)
MCHC: 30.6 g/dL (ref 30.0–36.0)
Monocytes Absolute: 0.5 10*3/uL (ref 0.1–1.0)
Neutro Abs: 4.1 10*3/uL (ref 1.7–7.7)
Platelets: 397 10*3/uL (ref 150–400)
RDW: 14.4 % (ref 11.5–15.5)
WBC: 6.1 10*3/uL (ref 4.0–10.5)

## 2012-07-23 LAB — TYPE AND SCREEN: Antibody Screen: NEGATIVE

## 2012-07-23 LAB — SURGICAL PCR SCREEN
MRSA, PCR: NEGATIVE
Staphylococcus aureus: POSITIVE — AB

## 2012-07-23 NOTE — Progress Notes (Signed)
Primary Care Physician: Dr. Shaune Pollack, [facility].

## 2012-07-27 MED ORDER — METRONIDAZOLE IN NACL 5-0.79 MG/ML-% IV SOLN
500.0000 mg | Freq: Once | INTRAVENOUS | Status: AC
  Administered 2012-07-28: 500 mg via INTRAVENOUS
  Filled 2012-07-27: qty 100

## 2012-07-27 MED ORDER — CIPROFLOXACIN IN D5W 400 MG/200ML IV SOLN
400.0000 mg | INTRAVENOUS | Status: AC
  Administered 2012-07-28: 400 mg via INTRAVENOUS
  Filled 2012-07-27: qty 200

## 2012-07-28 ENCOUNTER — Encounter (HOSPITAL_COMMUNITY)
Admission: RE | Disposition: A | Discharge status: Home / Self Care | Payer: Self-pay | Source: Ambulatory Visit | Attending: Surgery

## 2012-07-28 ENCOUNTER — Inpatient Hospital Stay (HOSPITAL_COMMUNITY)
Admission: RE | Admit: 2012-07-28 | Discharge: 2012-08-08 | DRG: 330 | Disposition: A | Discharge status: Home / Self Care | Payer: Medicare Other | Source: Ambulatory Visit | Attending: Surgery | Admitting: Surgery

## 2012-07-28 ENCOUNTER — Encounter (HOSPITAL_COMMUNITY): Payer: Self-pay | Admitting: *Deleted

## 2012-07-28 ENCOUNTER — Encounter (HOSPITAL_COMMUNITY): Payer: Self-pay | Admitting: Anesthesiology

## 2012-07-28 ENCOUNTER — Inpatient Hospital Stay (HOSPITAL_COMMUNITY): Payer: Medicare Other | Admitting: Anesthesiology

## 2012-07-28 DIAGNOSIS — E441 Mild protein-calorie malnutrition: Secondary | ICD-10-CM | POA: Diagnosis not present

## 2012-07-28 DIAGNOSIS — K625 Hemorrhage of anus and rectum: Secondary | ICD-10-CM

## 2012-07-28 DIAGNOSIS — E119 Type 2 diabetes mellitus without complications: Secondary | ICD-10-CM | POA: Diagnosis present

## 2012-07-28 DIAGNOSIS — K560 Paralytic ileus: Secondary | ICD-10-CM | POA: Diagnosis not present

## 2012-07-28 DIAGNOSIS — C189 Malignant neoplasm of colon, unspecified: Secondary | ICD-10-CM

## 2012-07-28 DIAGNOSIS — D649 Anemia, unspecified: Secondary | ICD-10-CM | POA: Diagnosis present

## 2012-07-28 DIAGNOSIS — C180 Malignant neoplasm of cecum: Principal | ICD-10-CM | POA: Diagnosis present

## 2012-07-28 DIAGNOSIS — I10 Essential (primary) hypertension: Secondary | ICD-10-CM | POA: Diagnosis present

## 2012-07-28 DIAGNOSIS — Z800 Family history of malignant neoplasm of digestive organs: Secondary | ICD-10-CM

## 2012-07-28 DIAGNOSIS — K219 Gastro-esophageal reflux disease without esophagitis: Secondary | ICD-10-CM | POA: Diagnosis present

## 2012-07-28 DIAGNOSIS — Z87891 Personal history of nicotine dependence: Secondary | ICD-10-CM

## 2012-07-28 HISTORY — PX: PARTIAL COLECTOMY: SHX5273

## 2012-07-28 LAB — CREATININE, SERUM
Creatinine, Ser: 0.72 mg/dL (ref 0.50–1.10)
GFR calc non Af Amer: 86 mL/min — ABNORMAL LOW (ref >90)

## 2012-07-28 LAB — CBC
Hemoglobin: 9.8 g/dL — ABNORMAL LOW (ref 12.0–15.0)
Platelets: 298 10*3/uL (ref 150–400)
RBC: 3.73 MIL/uL — ABNORMAL LOW (ref 3.87–5.11)
WBC: 9.2 10*3/uL (ref 4.0–10.5)

## 2012-07-28 LAB — GLUCOSE, CAPILLARY
Glucose-Capillary: 103 mg/dL — ABNORMAL HIGH (ref 70–99)
Glucose-Capillary: 168 mg/dL — ABNORMAL HIGH (ref 70–99)

## 2012-07-28 SURGERY — LAPAROSCOPIC PARTIAL COLECTOMY
Anesthesia: General | Wound class: Clean Contaminated

## 2012-07-28 MED ORDER — ONDANSETRON HCL 4 MG/2ML IJ SOLN
4.0000 mg | Freq: Once | INTRAMUSCULAR | Status: AC | PRN
  Administered 2012-07-28: 4 mg via INTRAVENOUS

## 2012-07-28 MED ORDER — VECURONIUM BROMIDE 10 MG IV SOLR
INTRAVENOUS | Status: DC | PRN
  Administered 2012-07-28: 1 mg via INTRAVENOUS
  Administered 2012-07-28: 2 mg via INTRAVENOUS

## 2012-07-28 MED ORDER — KETOROLAC TROMETHAMINE 15 MG/ML IJ SOLN
15.0000 mg | Freq: Four times a day (QID) | INTRAMUSCULAR | Status: AC | PRN
  Administered 2012-07-28 – 2012-08-01 (×11): 15 mg via INTRAVENOUS
  Filled 2012-07-28 (×11): qty 1

## 2012-07-28 MED ORDER — DEXAMETHASONE SODIUM PHOSPHATE 4 MG/ML IJ SOLN
INTRAMUSCULAR | Status: DC | PRN
  Administered 2012-07-28: 4 mg via INTRAVENOUS

## 2012-07-28 MED ORDER — FENTANYL CITRATE 0.05 MG/ML IJ SOLN
INTRAMUSCULAR | Status: DC | PRN
  Administered 2012-07-28 (×2): 100 ug via INTRAVENOUS
  Administered 2012-07-28 (×3): 50 ug via INTRAVENOUS

## 2012-07-28 MED ORDER — MIDAZOLAM HCL 5 MG/5ML IJ SOLN
INTRAMUSCULAR | Status: DC | PRN
  Administered 2012-07-28: 2 mg via INTRAVENOUS

## 2012-07-28 MED ORDER — PANTOPRAZOLE SODIUM 40 MG PO TBEC
40.0000 mg | DELAYED_RELEASE_TABLET | Freq: Every day | ORAL | Status: DC
  Administered 2012-07-29 – 2012-08-01 (×4): 40 mg via ORAL
  Filled 2012-07-28 (×3): qty 1

## 2012-07-28 MED ORDER — PROPOFOL 10 MG/ML IV EMUL
INTRAVENOUS | Status: DC | PRN
  Administered 2012-07-28: 150 mg via INTRAVENOUS

## 2012-07-28 MED ORDER — ALVIMOPAN 12 MG PO CAPS
12.0000 mg | ORAL_CAPSULE | Freq: Two times a day (BID) | ORAL | Status: DC
  Administered 2012-07-29 (×2): 12 mg via ORAL
  Filled 2012-07-28 (×4): qty 1

## 2012-07-28 MED ORDER — NEOSTIGMINE METHYLSULFATE 1 MG/ML IJ SOLN
INTRAMUSCULAR | Status: DC | PRN
  Administered 2012-07-28: 3.5 mg via INTRAVENOUS

## 2012-07-28 MED ORDER — MORPHINE SULFATE 2 MG/ML IJ SOLN
2.0000 mg | INTRAMUSCULAR | Status: DC | PRN
  Administered 2012-07-28 – 2012-08-02 (×16): 2 mg via INTRAVENOUS
  Filled 2012-07-28 (×16): qty 1

## 2012-07-28 MED ORDER — ONDANSETRON HCL 4 MG/2ML IJ SOLN
4.0000 mg | Freq: Four times a day (QID) | INTRAMUSCULAR | Status: DC | PRN
  Administered 2012-07-29 – 2012-08-05 (×11): 4 mg via INTRAVENOUS
  Filled 2012-07-28 (×12): qty 2

## 2012-07-28 MED ORDER — LISINOPRIL 20 MG PO TABS
20.0000 mg | ORAL_TABLET | Freq: Every day | ORAL | Status: DC
  Administered 2012-07-28 – 2012-08-08 (×12): 20 mg via ORAL
  Filled 2012-07-28 (×12): qty 1

## 2012-07-28 MED ORDER — BUPIVACAINE-EPINEPHRINE PF 0.25-1:200000 % IJ SOLN
INTRAMUSCULAR | Status: AC
  Filled 2012-07-28: qty 30

## 2012-07-28 MED ORDER — LACTATED RINGERS IV SOLN
INTRAVENOUS | Status: DC | PRN
  Administered 2012-07-28 (×3): via INTRAVENOUS

## 2012-07-28 MED ORDER — ALVIMOPAN 12 MG PO CAPS
12.0000 mg | ORAL_CAPSULE | Freq: Once | ORAL | Status: AC
  Administered 2012-07-28: 12 mg via ORAL
  Filled 2012-07-28: qty 1

## 2012-07-28 MED ORDER — CHLORHEXIDINE GLUCONATE 0.12 % MT SOLN
15.0000 mL | Freq: Two times a day (BID) | OROMUCOSAL | Status: DC
  Administered 2012-07-28 – 2012-08-04 (×13): 15 mL via OROMUCOSAL
  Administered 2012-08-04: 30 mL via OROMUCOSAL
  Administered 2012-08-05 – 2012-08-07 (×4): 15 mL via OROMUCOSAL
  Filled 2012-07-28 (×17): qty 15

## 2012-07-28 MED ORDER — PHENYLEPHRINE HCL 10 MG/ML IJ SOLN
INTRAMUSCULAR | Status: DC | PRN
  Administered 2012-07-28: 40 ug via INTRAVENOUS

## 2012-07-28 MED ORDER — SIMVASTATIN 20 MG PO TABS
20.0000 mg | ORAL_TABLET | Freq: Every day | ORAL | Status: DC
  Administered 2012-07-28 – 2012-07-31 (×4): 20 mg via ORAL
  Filled 2012-07-28 (×6): qty 1

## 2012-07-28 MED ORDER — GLYCOPYRROLATE 0.2 MG/ML IJ SOLN
INTRAMUSCULAR | Status: DC | PRN
  Administered 2012-07-28: .4 mg via INTRAVENOUS

## 2012-07-28 MED ORDER — LACTATED RINGERS IV SOLN
INTRAVENOUS | Status: DC
  Administered 2012-07-28: 11:00:00 via INTRAVENOUS

## 2012-07-28 MED ORDER — ROCURONIUM BROMIDE 100 MG/10ML IV SOLN
INTRAVENOUS | Status: DC | PRN
  Administered 2012-07-28: 50 mg via INTRAVENOUS

## 2012-07-28 MED ORDER — CHLORHEXIDINE GLUCONATE 4 % EX LIQD: 1.0000 "application " | Freq: Once | CUTANEOUS | Status: DC

## 2012-07-28 MED ORDER — BIOTENE DRY MOUTH MT LIQD
15.0000 mL | Freq: Two times a day (BID) | OROMUCOSAL | Status: DC
  Administered 2012-07-29 – 2012-08-07 (×17): 15 mL via OROMUCOSAL

## 2012-07-28 MED ORDER — BUPIVACAINE-EPINEPHRINE (PF) 0.25% -1:200000 IJ SOLN
INTRAMUSCULAR | Status: DC | PRN
  Administered 2012-07-28: 30 mL

## 2012-07-28 MED ORDER — ONDANSETRON HCL 4 MG PO TABS: 4.0000 mg | ORAL_TABLET | Freq: Four times a day (QID) | ORAL | Status: DC | PRN

## 2012-07-28 MED ORDER — LIDOCAINE HCL 1 % IJ SOLN
INTRAMUSCULAR | Status: DC | PRN
  Administered 2012-07-28: 100 mg via INTRADERMAL

## 2012-07-28 MED ORDER — SODIUM CHLORIDE 0.9 % IR SOLN
Status: DC | PRN
  Administered 2012-07-28: 1

## 2012-07-28 MED ORDER — ONDANSETRON HCL 4 MG/2ML IJ SOLN
INTRAMUSCULAR | Status: AC
  Administered 2012-07-28: 15:00:00
  Filled 2012-07-28: qty 2

## 2012-07-28 MED ORDER — ENOXAPARIN SODIUM 40 MG/0.4ML ~~LOC~~ SOLN
40.0000 mg | SUBCUTANEOUS | Status: DC
  Administered 2012-07-29 – 2012-08-07 (×10): 40 mg via SUBCUTANEOUS
  Filled 2012-07-28 (×12): qty 0.4

## 2012-07-28 MED ORDER — HYDROMORPHONE HCL PF 1 MG/ML IJ SOLN
0.2500 mg | INTRAMUSCULAR | Status: DC | PRN
  Administered 2012-07-28 (×2): 0.5 mg via INTRAVENOUS

## 2012-07-28 MED ORDER — HYDROMORPHONE HCL PF 1 MG/ML IJ SOLN
INTRAMUSCULAR | Status: AC
  Administered 2012-07-28: 0.5 mg via INTRAVENOUS
  Filled 2012-07-28: qty 1

## 2012-07-28 MED ORDER — KCL IN DEXTROSE-NACL 20-5-0.45 MEQ/L-%-% IV SOLN
INTRAVENOUS | Status: DC
  Administered 2012-07-28 – 2012-08-03 (×14): via INTRAVENOUS
  Filled 2012-07-28 (×17): qty 1000

## 2012-07-28 SURGICAL SUPPLY — 74 items
APPLIER CLIP ROT 10 11.4 M/L (STAPLE)
APR CLP MED LRG 11.4X10 (STAPLE)
BLADE SURG 10 STRL SS (BLADE) ×2 IMPLANT
BLADE SURG ROTATE 9660 (MISCELLANEOUS) IMPLANT
CANISTER SUCTION 2500CC (MISCELLANEOUS) ×2 IMPLANT
CELLS DAT CNTRL 66122 CELL SVR (MISCELLANEOUS) IMPLANT
CHLORAPREP W/TINT 26ML (MISCELLANEOUS) ×2 IMPLANT
CLIP APPLIE ROT 10 11.4 M/L (STAPLE) IMPLANT
CLOTH BEACON ORANGE TIMEOUT ST (SAFETY) ×2 IMPLANT
COVER SURGICAL LIGHT HANDLE (MISCELLANEOUS) ×2 IMPLANT
DECANTER SPIKE VIAL GLASS SM (MISCELLANEOUS) ×2 IMPLANT
DRAPE PROXIMA HALF (DRAPES) IMPLANT
DRAPE UTILITY 15X26 W/TAPE STR (DRAPE) ×4 IMPLANT
DRAPE WARM FLUID 44X44 (DRAPE) ×4 IMPLANT
DRSG COVADERM 4X6 (GAUZE/BANDAGES/DRESSINGS) ×1 IMPLANT
ELECT CAUTERY BLADE 6.4 (BLADE) ×3 IMPLANT
ELECT REM PT RETURN 9FT ADLT (ELECTROSURGICAL) ×2
ELECTRODE REM PT RTRN 9FT ADLT (ELECTROSURGICAL) ×1 IMPLANT
GAUZE SPONGE 2X2 8PLY STRL LF (GAUZE/BANDAGES/DRESSINGS) IMPLANT
GEL ULTRASOUND 20GR AQUASONIC (MISCELLANEOUS) IMPLANT
GLOVE BIO SURGEON STRL SZ7.5 (GLOVE) ×2 IMPLANT
GLOVE BIO SURGEON STRL SZ8 (GLOVE) ×5 IMPLANT
GLOVE BIOGEL PI IND STRL 7.0 (GLOVE) IMPLANT
GLOVE BIOGEL PI IND STRL 7.5 (GLOVE) ×2 IMPLANT
GLOVE BIOGEL PI IND STRL 8 (GLOVE) ×1 IMPLANT
GLOVE BIOGEL PI INDICATOR 7.0 (GLOVE) ×2
GLOVE BIOGEL PI INDICATOR 7.5 (GLOVE) ×2
GLOVE BIOGEL PI INDICATOR 8 (GLOVE) ×1
GOWN STRL NON-REIN LRG LVL3 (GOWN DISPOSABLE) ×9 IMPLANT
KIT BASIN OR (CUSTOM PROCEDURE TRAY) ×2 IMPLANT
KIT ROOM TURNOVER OR (KITS) ×2 IMPLANT
LEGGING LITHOTOMY PAIR STRL (DRAPES) IMPLANT
LIGASURE IMPACT 36 18CM CVD LR (INSTRUMENTS) ×2 IMPLANT
NS IRRIG 1000ML POUR BTL (IV SOLUTION) ×4 IMPLANT
PAD ARMBOARD 7.5X6 YLW CONV (MISCELLANEOUS) ×4 IMPLANT
PENCIL BUTTON HOLSTER BLD 10FT (ELECTRODE) ×2 IMPLANT
RTRCTR WOUND ALEXIS 18CM MED (MISCELLANEOUS)
SCALPEL HARMONIC ACE (MISCELLANEOUS) ×1 IMPLANT
SCISSORS LAP 5X35 DISP (ENDOMECHANICALS) IMPLANT
SET IRRIG TUBING LAPAROSCOPIC (IRRIGATION / IRRIGATOR) IMPLANT
SLEEVE ENDOPATH XCEL 5M (ENDOMECHANICALS) ×2 IMPLANT
SPECIMEN JAR LARGE (MISCELLANEOUS) IMPLANT
SPONGE GAUZE 2X2 STER 10/PKG (GAUZE/BANDAGES/DRESSINGS) ×1
SPONGE GAUZE 4X4 12PLY (GAUZE/BANDAGES/DRESSINGS) ×2 IMPLANT
STAPLER VISISTAT 35W (STAPLE) ×2 IMPLANT
SURGILUBE 2OZ TUBE FLIPTOP (MISCELLANEOUS) IMPLANT
SUT PDS AB 1 CT  36 (SUTURE) ×2
SUT PDS AB 1 CT 36 (SUTURE) IMPLANT
SUT PROLENE 2 0 CT2 30 (SUTURE) IMPLANT
SUT PROLENE 2 0 KS (SUTURE) IMPLANT
SUT SILK 2 0 (SUTURE) ×2
SUT SILK 2-0 18XBRD TIE 12 (SUTURE) ×1 IMPLANT
SUT SILK 3 0 (SUTURE) ×2
SUT SILK 3-0 18XBRD TIE 12 (SUTURE) ×1 IMPLANT
SUT VIC AB 2-0 SH 18 (SUTURE) ×1 IMPLANT
SUT VIC AB 3-0 54X BRD REEL (SUTURE) IMPLANT
SUT VIC AB 3-0 BRD 54 (SUTURE) ×2
SUT VIC AB 3-0 SH 18 (SUTURE) ×2 IMPLANT
SUT VICRYL AB 2 0 TIES (SUTURE) ×2 IMPLANT
SYS LAPSCP GELPORT 120MM (MISCELLANEOUS) ×2
SYSTEM LAPSCP GELPORT 120MM (MISCELLANEOUS) IMPLANT
TAPE CLOTH SURG 4X10 WHT LF (GAUZE/BANDAGES/DRESSINGS) ×1 IMPLANT
TOWEL OR 17X24 6PK STRL BLUE (TOWEL DISPOSABLE) ×2 IMPLANT
TOWEL OR 17X26 10 PK STRL BLUE (TOWEL DISPOSABLE) ×2 IMPLANT
TRAY FOLEY CATH 14FRSI W/METER (CATHETERS) ×2 IMPLANT
TRAY LAPAROSCOPIC (CUSTOM PROCEDURE TRAY) ×2 IMPLANT
TRAY PROCTOSCOPIC FIBER OPTIC (SET/KITS/TRAYS/PACK) IMPLANT
TROCAR XCEL BLUNT TIP 100MML (ENDOMECHANICALS) IMPLANT
TROCAR XCEL NON-BLD 11X100MML (ENDOMECHANICALS) ×2 IMPLANT
TROCAR XCEL NON-BLD 5MMX100MML (ENDOMECHANICALS) ×2 IMPLANT
TUBE CONNECTING 12X1/4 (SUCTIONS) ×2 IMPLANT
TUBING FILTER THERMOFLATOR (ELECTROSURGICAL) ×2 IMPLANT
WATER STERILE IRR 1000ML POUR (IV SOLUTION) IMPLANT
YANKAUER SUCT BULB TIP NO VENT (SUCTIONS) ×2 IMPLANT

## 2012-07-28 NOTE — Op Note (Signed)
Laparoscopic Partial Colectomy Procedure Note  Indications: This patient presents for a laparoscopic partial  hemicolectomy for adenocarcinoma of the right  colon. The patient underwent a complete mechanical and antibiotic bowel prep prior to her operation.The procedure was discussed with the patient.  Laparoscopic partial colectomy discussed with the patient as well as non operative treatments. The risks of operative management include bleeding,  Infection,  Leak of anastamosis,  Ostomy formation, open procedure,  Sepsis,  Abcess,  Hernia,  DVT,  Pulmonary complications,  Cardiovascular  complications,  Injury to ureter,  Bladder,kidney,and anesthesia risks,  And death. The patient understands.  Questions answered.   The success of the procedure is 50-100 % for treating the patients symptoms. They agree to proceed.  Pre-operative Diagnosis: adenocarcinoma of right colon  Post-operative Diagnosis: adenocarcinoma of right colon  Surgeon: Yoland Scherr A.   Assistants: Dr Corliss Skains  Anesthesia: General endotracheal anesthesia  ASA Class: 2  Procedure Details  The patient was seen in the Holding Room. The risks, benefits, complications, treatment options, and expected outcomes were discussed with the patient. The possibilities of reaction to medication, pulmonary aspiration, perforation of viscus, bleeding, recurrent infection, finding a normal colon, the need for additional procedures, failure to diagnose a condition, and creating a complication requiring transfusion or operation were discussed with the patient. The patient concurred with the proposed plan, giving informed consent.   The patient was taken to Operating Room # 9, identified as Nicoya Friel and the procedure verified as partial colectomy. A Time Out was held and the above information confirmed.  The patient was brought to the operating room and placed supine. After induction of a general anesthetic, a Foley catheter was  inserted and the abdomen was prepped and draped in standard fashion. Quarter  percent Marcaine with epinephrine was used to anesthetize the skin around the umbilicus. 5 mm Optiview port was placed the left midabdomen under direct vision. All layers of the abdominal wall were visualized as the trocar was passed into the abdominal cavity without difficulty. Insufflation of the abdominal cavity to 15 mm of mercury pressure was done. No evidence of injury with insertion.   Exploration revealed a normal omentum, colon, small bowel, peritoneum, liver, and stomach. A left lower quadrant and left  lateral 5-mm trocar were then placed after anesthetizing the skin and peritoneum with Marcaine. The ascending  colon and hepatic flexure were then mobilized with gentle retraction of the colon in a medial direction with mobilization of the peritoneal reflection  the harmonic scalpel. Mobilization of this area was complete to expose the retroperitoneum. The ureter was not identified during this mobilization process but no structures were divided during this mobilization. There was no blood loss during this portion of the procedure.      The mobilization continued to include  the hepatic flexure with the harmonic scalpel in a bloodless field. 7 cm incision was made in the midline and GelPort was placed and the colon was delivered through the incision. The colon was resected with a linear stapling device proximal and distal to the area in question in regard to the specimen. The mesenteric vessels were clamped and ligated. LigaSure was used and 2 o Vicryl ties.The specimen was submitted to pathology.      An end-to-end anastomosis was performed through the small anterior incision with the linear stapling device. The mucosa was inspected and found to be hemostatic. Closure was achieved with the linear stapling device consisting of GIA 75 and TA 60. There  is no tension or twisting of the anastomosis. Hemostasis was excellent.. A  2-0 vicryl suture was used to reapproximate the angle of the anastomosis. Hemostasis was confirmed. The bowel anastomosis was returned and the incision was then closed with a running #1 PDS suture. LAPAROSCOPE REPLACED. NO SIGNS OF BLEEDING or twisting the anastomosis.Marland Kitchen PORTS REMOVED. SKIN CLOSED WITH STAPLES.PATIENT EXTUBATED TAKEN TO RECOVERY SATISFACTORY CONDITION. ALL COUNTS FOUND TO BE CORRECT. Instrument, sponge, and needle counts were correct prior to abdominal closure and at the conclusion of the case.   Findings: SEE ABOVE  Estimated Blood Loss: less than 50 mL         Drains: NONE         Total IV Fluids:         Specimens: RIGHT COLON           Complications: None; patient tolerated the procedure well.         Disposition: PACU - hemodynamically stable.         Condition: stable

## 2012-07-28 NOTE — Anesthesia Postprocedure Evaluation (Signed)
  Anesthesia Post-op Note  Patient: Linda Grant  Procedure(s) Performed: Procedure(s) (LRB): LAPAROSCOPIC PARTIAL COLECTOMY (N/A)  Patient Location: PACU  Anesthesia Type: General  Level of Consciousness: awake, alert  and oriented  Airway and Oxygen Therapy: Patient Spontanous Breathing and Patient connected to nasal cannula oxygen  Post-op Pain: mild  Post-op Assessment: Post-op Vital signs reviewed  Post-op Vital Signs: Reviewed  Complications: No apparent anesthesia complications

## 2012-07-28 NOTE — Interval H&P Note (Signed)
History and Physical Interval Note:  07/28/2012 10:38 AM  Linda Grant  has presented today for surgery, with the diagnosis of cecal cancer  The various methods of treatment have been discussed with the patient and family. After consideration of risks, benefits and other options for treatment, the patient has consented to  Procedure(s) (LRB): LAPAROSCOPIC PARTIAL COLECTOMY (N/A) as a surgical intervention .  The patient's history has been reviewed, patient examined, no change in status, stable for surgery.  I have reviewed the patient's chart and labs.  Questions were answered to the patient's satisfaction.     Talvin Christianson A.

## 2012-07-28 NOTE — H&P (View-Only) (Signed)
Patient ID: Linda Grant, female   DOB: 02/17/1941, 71 y.o.   MRN: [id number]  Chief Complaint  Patient presents with  . New Evaluation    Adenocarcinoma of cecum    HPI Linda Grant is a 71 y.o. female.  Patient is sent at the request of Dr. Brody to to a mass in her colon located at the cecum. She underwent colonoscopy which revealed a mass in the cecum biopsy-proven to be adenocarcinoma. She is with chronic anemia with a hemoglobin of 10 and complains of fatigue. Denies any significant abdominal pain, nausea or vomiting. No blood in her stool or change in her dietary habits. She has no bloating. She has been under stress due to family issues. HPI  Past Medical History  Diagnosis Date  . Elevated cholesterol   . Hypertension   . GERD (gastroesophageal reflux disease)   . Vitamin d deficiency   . Hyperplastic colon polyp   . Diverticulosis   . Hx of adenomatous colonic polyps 07/02/12  . Anemia   . 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 Diabetes mellitus     type 2 - controlled  . Heart murmur   . Mitral valve prolapse   . Rectal bleeding   . Blood in stool     Past Surgical History  Procedure Date  . Vaginal hysterectomy   . Mouth surgery     dental implants    Family History  Problem Relation Age of Onset  . Colon cancer Sister 37  . Cancer Sister     breast  . Diabetes Brother     deceased  . Diabetes Brother 73  . Heart disease Brother   . Kidney disease Brother     deceased  . Heart disease Father   . Cancer Sister     lung    Social History History  Substance Use Topics  . Smoking status: Former Smoker    Quit date: 07/10/1982  . Smokeless tobacco: Never Used  . Alcohol Use: Yes     1 to 2 per day    Allergies  Allergen Reactions  . Penicillins Anaphylaxis    Current Outpatient Prescriptions  Medication Sig Dispense Refill  . aspirin 81 MG tablet Take 81 mg by mouth daily.      . Cholecalciferol (VITAMIN D) 2000 UNITS CAPS Take by mouth  1 day or 1 dose.      . ferrous sulfate 325 (65 FE) MG tablet Take 325 mg by mouth daily with breakfast.      . folic acid (FOLVITE) 1 MG tablet Take 1 mg by mouth daily.      . lisinopril (PRINIVIL,ZESTRIL) 20 MG tablet Take 20 mg by mouth daily.      . lovastatin (MEVACOR) 40 MG tablet Take 40 mg by mouth at bedtime.      . omeprazole (PRILOSEC) 20 MG capsule Take 20 mg by mouth daily.        Review of Systems Review of Systems  Constitutional: Positive for fatigue. Negative for fever, chills and unexpected weight change.  HENT: Negative for hearing loss, congestion, sore throat, trouble swallowing and voice change.   Eyes: Negative for visual disturbance.  Respiratory: Negative for cough and wheezing.   Cardiovascular: Negative for chest pain, palpitations and leg swelling.  Gastrointestinal: Negative for nausea, vomiting, abdominal pain, diarrhea, constipation, blood in stool, abdominal distention and anal bleeding.  Genitourinary: Negative for hematuria, vaginal bleeding and difficulty urinating.  Musculoskeletal: Negative for arthralgias.    Skin: Negative for rash and wound.  Neurological: Negative for seizures, syncope and headaches.  Hematological: Negative for adenopathy. Does not bruise/bleed easily.  Psychiatric/Behavioral: Positive for dysphoric mood. Negative for confusion.    Blood pressure 132/74, pulse 70, temperature 97.8 F (36.6 C), temperature source Temporal, resp. rate 14, height 5' (1.524 m), weight 153 lb 2 oz (69.457 kg).  Physical Exam Physical Exam  Constitutional: She is oriented to person, place, and time. She appears well-developed and well-nourished.  HENT:  Head: Normocephalic and atraumatic.  Eyes: EOM are normal. Pupils are equal, round, and reactive to light.  Neck: Normal range of motion. Neck supple.  Cardiovascular: Normal rate and regular rhythm.   Pulmonary/Chest: Effort normal and breath sounds normal.  Abdominal: Soft. Bowel sounds are  normal. She exhibits no distension and no mass. There is no tenderness. There is no rebound and no guarding.  Musculoskeletal: Normal range of motion.  Neurological: She is alert and oriented to person, place, and time.  Skin: Skin is warm and dry.  Psychiatric: She has a normal mood and affect. Her behavior is normal. Judgment and thought content normal.    Data Reviewed Mild eccentric wall thickening in the cecum, likely corresponding  to the biopsy proven colonic adenocarcinoma.  Surrounding pericolonic stranding/inflammatory changes. Associated  9 mm short axis ileocolic lymph node.  No evidence of distant metastasis in the abdomen/pelvis. COLONOSCOPY  CECAL MASS BX PROVEN ADENOCARCINOMA  CEA NORMAL HGB 10  Assessment    Carcinoma of cecum    Plan    Laparoscopic assisted partial colectomy.The procedure was discussed with the patient.  Laparoscopic partial colectomy discussed with the patient as well as non operative treatments. The risks of operative management include bleeding,  Infection,  Leak of anastamosis,  Ostomy formation, open procedure,  Sepsis,  Abcess,  Hernia,  DVT,  Pulmonary complications,  Cardiovascular  complications,  Injury to ureter,  Bladder,kidney,and anesthesia risks,  And death. The patient understands.  Questions answered.   The success of the procedure is 50-100 % for treating the patients symptoms. They agree to proceed.       Kendle Erker A. 07/10/2012, 3:21 PM    

## 2012-07-28 NOTE — Anesthesia Preprocedure Evaluation (Addendum)
Anesthesia Evaluation  Patient identified by MRN, date of birth, ID band Patient awake    Reviewed: Allergy & Precautions, H&P , NPO status , Patient's Chart, lab work & pertinent test results  History of Anesthesia Complications Negative for: history of anesthetic complications  Airway Mallampati: I TM Distance: >3 FB Neck ROM: Full    Dental  (+) Teeth Intact and Dental Advisory Given   Pulmonary former smoker,  breath sounds clear to auscultation        Cardiovascular hypertension, Pt. on medications + Valvular Problems/Murmurs MVP Rhythm:Regular Rate:Normal     Neuro/Psych negative neurological ROS  negative psych ROS   GI/Hepatic Neg liver ROS, Bowel prep,GERD-  Medicated and Controlled,(+)     substance abuse  alcohol use, Adenocarcinoma of cecum    Endo/Other  Well Controlled, Type 2On no meds for "pre-diabetes".  Renal/GU negative Renal ROS  negative genitourinary   Musculoskeletal negative musculoskeletal ROS (+)   Abdominal   Peds  Hematology  (+) Blood dyscrasia, anemia ,   Anesthesia Other Findings   Reproductive/Obstetrics negative OB ROS                        Anesthesia Physical Anesthesia Plan  ASA: II  Anesthesia Plan: General   Post-op Pain Management:    Induction: Intravenous  Airway Management Planned: Oral ETT  Additional Equipment:   Intra-op Plan:   Post-operative Plan: Extubation in OR  Informed Consent: I have reviewed the patients History and Physical, chart, labs and discussed the procedure including the risks, benefits and alternatives for the proposed anesthesia with the patient or authorized representative who has indicated his/her understanding and acceptance.   Dental advisory given  Plan Discussed with: CRNA, Surgeon and Anesthesiologist  Anesthesia Plan Comments:         Anesthesia Quick Evaluation

## 2012-07-28 NOTE — Preoperative (Signed)
Beta Blockers   Reason not to administer Beta Blockers:Not Applicable 

## 2012-07-28 NOTE — Transfer of Care (Signed)
Immediate Anesthesia Transfer of Care Note  Patient: Linda Grant  Procedure(s) Performed: Procedure(s) (LRB): LAPAROSCOPIC PARTIAL COLECTOMY (N/A)  Patient Location: PACU  Anesthesia Type: General  Level of Consciousness: awake, alert  and patient cooperative  Airway & Oxygen Therapy: Patient Spontanous Breathing and Patient connected to face mask oxygen  Post-op Assessment: Report given to PACU RN, Post -op Vital signs reviewed and stable and Patient moving all extremities  Post vital signs: Reviewed and stable  Complications: No apparent anesthesia complications

## 2012-07-29 LAB — CBC
Hemoglobin: 9.1 g/dL — ABNORMAL LOW (ref 12.0–15.0)
MCH: 25.9 pg — ABNORMAL LOW (ref 26.0–34.0)
MCHC: 30.3 g/dL (ref 30.0–36.0)
Platelets: 329 10*3/uL (ref 150–400)
RBC: 3.52 MIL/uL — ABNORMAL LOW (ref 3.87–5.11)

## 2012-07-29 LAB — BASIC METABOLIC PANEL
Calcium: 8.6 mg/dL (ref 8.4–10.5)
GFR calc Af Amer: >90 mL/min (ref >90)
GFR calc non Af Amer: 90 mL/min — ABNORMAL LOW (ref >90)
Glucose, Bld: 198 mg/dL — ABNORMAL HIGH (ref 70–99)
Potassium: 4 mEq/L (ref 3.5–5.1)
Sodium: 141 mEq/L (ref 135–145)

## 2012-07-29 NOTE — Progress Notes (Signed)
1 Day Post-Op  Subjective: Looks well.    Objective: Vital signs in last 24 hours: Temp:  [97 F (36.1 C)-98.2 F (36.8 C)] 98.2 F (36.8 C) (08/27 0537) Pulse Rate:  [80-92] 82  (08/27 0537) Resp:  [14-22] 18  (08/27 0537) BP: (107-152)/(50-87) 119/57 mmHg (08/27 0537) SpO2:  [98 %-100 %] 98 % (08/27 0537) Last BM Date: 07/28/12  Intake/Output from previous day: 08/26 0701 - 08/27 0700 In: 4399.7 [I.V.:4399.7] Out: 1285 [Urine:1285] Intake/Output this shift:    Resp: clear to auscultation bilaterally Cardio: regular rate and rhythm, S1, S2 normal, no murmur, click, rub or gallop Incision/Wound:dry dressing.  Min distension   BS present  Lab Results:   Basename 07/29/12 0540 07/28/12 1548  WBC 7.7 9.2  HGB 9.1* 9.8*  HCT 30.0* 31.9*  PLT 329 298   BMET  Basename 07/29/12 0540 07/28/12 1548  NA 141 --  K 4.0 --  CL 108 --  CO2 24 --  GLUCOSE 198* --  BUN 6 --  CREATININE 0.62 0.72  CALCIUM 8.6 --   PT/INR No results found for this basename: LABPROT:2,INR:2 in the last 72 hours ABG No results found for this basename: PHART:2,PCO2:2,PO2:2,HCO3:2 in the last 72 hours  Studies/Results: No results found.  Anti-infectives: Anti-infectives     Start     Dose/Rate Route Frequency Ordered Stop   07/27/12 1315   metroNIDAZOLE (FLAGYL) IVPB 500 mg     Comments: For OR      500 mg 100 mL/hr over 60 Minutes Intravenous  Once 07/27/12 1306 07/28/12 1054   07/27/12 1306   ciprofloxacin (CIPRO) IVPB 400 mg        400 mg 200 mL/hr over 60 Minutes Intravenous 120 min pre-op 07/27/12 1306 07/28/12 1054          Assessment/Plan: s/p Procedure(s) (LRB): LAPAROSCOPIC PARTIAL COLECTOMY (N/A) d/c foley Advance diet Anemia follow for now  LOS: 1 day    Haile Toppins A. 07/29/2012

## 2012-07-29 NOTE — Progress Notes (Signed)
Pt refused to have foley cath D/c until they had talked to MD this morning.

## 2012-07-29 NOTE — Progress Notes (Signed)
UR complete 

## 2012-07-30 ENCOUNTER — Inpatient Hospital Stay (HOSPITAL_COMMUNITY): Payer: Medicare Other

## 2012-07-30 LAB — CBC
HCT: 33.7 % — ABNORMAL LOW (ref 36.0–46.0)
MCV: 87.8 fL (ref 78.0–100.0)
Platelets: 360 10*3/uL (ref 150–400)
RBC: 3.84 MIL/uL — ABNORMAL LOW (ref 3.87–5.11)
RDW: 14.5 % (ref 11.5–15.5)
WBC: 9.2 10*3/uL (ref 4.0–10.5)

## 2012-07-30 MED ORDER — ALUM & MAG HYDROXIDE-SIMETH 200-200-20 MG/5ML PO SUSP
15.0000 mL | ORAL | Status: DC | PRN
  Administered 2012-07-30 – 2012-08-02 (×4): 15 mL via ORAL
  Filled 2012-07-30 (×4): qty 30

## 2012-07-30 MED ORDER — IOHEXOL 300 MG/ML  SOLN
100.0000 mL | Freq: Once | INTRAMUSCULAR | Status: AC | PRN
  Administered 2012-07-30: 100 mL via INTRAVENOUS

## 2012-07-30 MED ORDER — ONDANSETRON HCL 4 MG/2ML IJ SOLN
4.0000 mg | Freq: Once | INTRAMUSCULAR | Status: AC
  Administered 2012-07-30: 4 mg via INTRAVENOUS

## 2012-07-30 NOTE — Progress Notes (Signed)
2 Days Post-Op  Subjective: Bowels moving 4 times.  crampy abdominal pain with nausea and a little vomiting Objective: Vital signs in last 24 hours: Temp:  [97.3 F (36.3 C)-98 F (36.7 C)] 97.3 F (36.3 C) (08/28 0512) Pulse Rate:  [80-89] 80  (08/28 0512) Resp:  [17-18] 18  (08/28 0512) BP: (103-134)/(52-64) 134/55 mmHg (08/28 0512) SpO2:  [93 %-97 %] 93 % (08/28 0512) Weight:  [152 lb 1.9 oz (69 kg)] 152 lb 1.9 oz (69 kg) (08/28 0300) Last BM Date: 07/28/12  Intake/Output from previous day: 08/27 0701 - 08/28 0700 In: 1465.8 [P.O.:720; I.V.:745.8] Out: 2400 [Urine:2400] Intake/Output this shift:    Incision/Wound:clean dressing silght distension to abdomen but soft  Lab Results:   Basename 07/30/12 0500 07/29/12 0540  WBC 9.2 7.7  HGB 10.0* 9.1*  HCT 33.7* 30.0*  PLT 360 329   BMET  Basename 07/29/12 0540 07/28/12 1548  NA 141 --  K 4.0 --  CL 108 --  CO2 24 --  GLUCOSE 198* --  BUN 6 --  CREATININE 0.62 0.72  CALCIUM 8.6 --   PT/INR No results found for this basename: LABPROT:2,INR:2 in the last 72 hours ABG No results found for this basename: PHART:2,PCO2:2,PO2:2,HCO3:2 in the last 72 hours  Studies/Results: No results found.  Anti-infectives: Anti-infectives     Start     Dose/Rate Route Frequency Ordered Stop   07/27/12 1315   metroNIDAZOLE (FLAGYL) IVPB 500 mg     Comments: For OR      500 mg 100 mL/hr over 60 Minutes Intravenous  Once 07/27/12 1306 07/28/12 1054   07/27/12 1306   ciprofloxacin (CIPRO) IVPB 400 mg        400 mg 200 mL/hr over 60 Minutes Intravenous 120 min pre-op 07/27/12 1306 07/28/12 1054          Assessment/Plan: s/p Procedure(s) (LRB): LAPAROSCOPIC PARTIAL COLECTOMY (N/A) Check KUB KEEP ON CLEARS FOR NOW STOP ENTEREG LABS IN AM  LOS: 2 days    Donnika Kucher A. 07/30/2012

## 2012-07-30 NOTE — Progress Notes (Signed)
KUB shows ileus pattern but with crampy pain and nausea out of proportion.  Will check CT scan today as well.

## 2012-07-30 NOTE — Progress Notes (Signed)
Md made aware patient vomited while drinking oral contrast. CT made aware.

## 2012-07-31 LAB — COMPREHENSIVE METABOLIC PANEL
ALT: 12 U/L (ref 0–35)
AST: 11 U/L (ref 0–37)
Alkaline Phosphatase: 73 U/L (ref 39–117)
CO2: 20 mEq/L (ref 19–32)
GFR calc Af Amer: >90 mL/min (ref >90)
GFR calc non Af Amer: 85 mL/min — ABNORMAL LOW (ref >90)
Glucose, Bld: 156 mg/dL — ABNORMAL HIGH (ref 70–99)
Potassium: 4.6 mEq/L (ref 3.5–5.1)
Sodium: 134 mEq/L — ABNORMAL LOW (ref 135–145)

## 2012-07-31 LAB — CBC
Hemoglobin: 10.8 g/dL — ABNORMAL LOW (ref 12.0–15.0)
Platelets: 358 10*3/uL (ref 150–400)
RBC: 4.1 MIL/uL (ref 3.87–5.11)
WBC: 8.5 10*3/uL (ref 4.0–10.5)

## 2012-07-31 MED ORDER — METOCLOPRAMIDE HCL 5 MG/ML IJ SOLN
10.0000 mg | Freq: Four times a day (QID) | INTRAMUSCULAR | Status: DC | PRN
  Administered 2012-07-31 – 2012-08-03 (×7): 10 mg via INTRAVENOUS
  Filled 2012-07-31 (×4): qty 2

## 2012-07-31 NOTE — Progress Notes (Signed)
3 Days Post-Op  Subjective: Good BM but still with nausea.  CT shows ileus but no obstruction.  Objective: Vital signs in last 24 hours: Temp:  [98 F (36.7 C)-98.3 F (36.8 C)] 98 F (36.7 C) (08/29 0511) Pulse Rate:  [87-90] 87  (08/29 0511) Resp:  [18] 18  (08/29 0511) BP: (106-111)/(55-60) 106/55 mmHg (08/29 0511) SpO2:  [94 %] 94 % (08/29 0511) Last BM Date: 07/28/12  Intake/Output from previous day: 08/28 0701 - 08/29 0700 In: 1200 [I.V.:1200] Out: -  Intake/Output this shift:    Incision/Wound:clean dry intact.  Soft min tender abdomen less distension  Lab Results:   Basename 07/30/12 0500 07/29/12 0540  WBC 9.2 7.7  HGB 10.0* 9.1*  HCT 33.7* 30.0*  PLT 360 329   BMET  Basename 07/29/12 0540 07/28/12 1548  NA 141 --  K 4.0 --  CL 108 --  CO2 24 --  GLUCOSE 198* --  BUN 6 --  CREATININE 0.62 0.72  CALCIUM 8.6 --   PT/INR No results found for this basename: LABPROT:2,INR:2 in the last 72 hours ABG No results found for this basename: PHART:2,PCO2:2,PO2:2,HCO3:2 in the last 72 hours  Studies/Results: Dg Abd 1 View  07/30/2012  *RADIOLOGY REPORT*  Clinical Data: Abdominal pain.  Recent surgery.  ABDOMEN - 1 VIEW  Comparison: CT scan 07/03/2012.  Findings: There are air distended small bowel loops and scattered air in the colon.  Findings are likely due to a postoperative ileus.  No free air.  Surgical changes noted from a recent right hemicolectomy.  IMPRESSION: There are distended small bowel loops and to a lesser extent colon suggesting a diffuse postoperative ileus.   Original Report Authenticated By: P. Loralie Champagne, M.D.    Ct Abdomen Pelvis W Contrast  07/30/2012  *RADIOLOGY REPORT*  Clinical Data: Postoperative abdominal pain and vomiting.  Partial colectomy on 08/26.  CT ABDOMEN AND PELVIS WITH CONTRAST  Technique:  Multidetector CT imaging of the abdomen and pelvis was performed following the standard protocol during bolus administration of  intravenous contrast.  Contrast: OMNIPAQUE IOHEXOL 300 MG/ML  SOLN  Comparison: CT scan 07/03/2012.  Findings: The lung bases demonstrate small bilateral pleural effusions and bibasilar atelectasis.  There is a small hiatal hernia noted.  The solid abdominal organs are unremarkable and stable.  There is a small cyst noted near the gallbladder.  No findings to suggest metastatic disease.  The gallbladder is normal.  The common bile duct is normal in caliber.  There are expected postoperative changes with a small amount of free fluid in the abdomen and pelvis along with free air.  The small bowel is slightly dilated and contains scattered air fluid levels.  Normal mucosal enhancement is demonstrated.  No transition point is identified.  The transverse colon is slightly dilated and contains air and fluid.  The left colon and sigmoid colon are decompressed.  Findings most likely due to a postoperative ileus.  No obvious obstruction or compromised bowel.  The aorta is normal in caliber.  The major branch vessels are patent.  Mild to moderate atherosclerotic calcifications.  IMPRESSION:  1.  Expected postoperative changes with free abdominal and pelvic fluid and free air. 2.  Slightly dilated fluid-filled small bowel loops with air-fluid levels but no definite obstruction or transition point.  The transverse colon is also slightly dilated with fluid and air. Findings most likely due to a postoperative ileus.   Original Report Authenticated By: P. Loralie Champagne, M.D.  Anti-infectives: Anti-infectives     Start     Dose/Rate Route Frequency Ordered Stop   07/27/12 1315   metroNIDAZOLE (FLAGYL) IVPB 500 mg     Comments: For OR      500 mg 100 mL/hr over 60 Minutes Intravenous  Once 07/27/12 1306 07/28/12 1054   07/27/12 1306   ciprofloxacin (CIPRO) IVPB 400 mg        400 mg 200 mL/hr over 60 Minutes Intravenous 120 min pre-op 07/27/12 1306 07/28/12 1054          Assessment/Plan: s/p  Procedure(s) (LRB): LAPAROSCOPIC PARTIAL COLECTOMY (N/A) Clear liquid diet Try reglan for nausea  LOS: 3 days    Linda Grant A. 07/31/2012

## 2012-08-01 NOTE — Progress Notes (Signed)
4 Days Post-Op  Subjective: Looks better. BM last night.  Nausea better.  Objective: Vital signs in last 24 hours: Temp:  [97.3 F (36.3 C)-98.6 F (37 C)] 98.6 F (37 C) (08/30 0627) Pulse Rate:  [85-91] 91  (08/30 0627) Resp:  [18-20] 18  (08/30 0627) BP: (119-132)/(50-62) 132/50 mmHg (08/30 0627) SpO2:  [95 %-98 %] 98 % (08/30 0627) Last BM Date: 07/31/12  Intake/Output from previous day: 08/29 0701 - 08/30 0700 In: 2640 [P.O.:840; I.V.:1800] Out: -  Intake/Output this shift:    Incision/Wound:clean dry intact slight distension  Lab Results:   Basename 07/31/12 0700 07/30/12 0500  WBC 8.5 9.2  HGB 10.8* 10.0*  HCT 35.7* 33.7*  PLT 358 360   BMET  Basename 07/31/12 0700  NA 134*  K 4.6  CL 105  CO2 20  GLUCOSE 156*  BUN 11  CREATININE 0.74  CALCIUM 8.5   PT/INR No results found for this basename: LABPROT:2,INR:2 in the last 72 hours ABG No results found for this basename: PHART:2,PCO2:2,PO2:2,HCO3:2 in the last 72 hours  Studies/Results: Dg Abd 1 View  07/30/2012  *RADIOLOGY REPORT*  Clinical Data: Abdominal pain.  Recent surgery.  ABDOMEN - 1 VIEW  Comparison: CT scan 07/03/2012.  Findings: There are air distended small bowel loops and scattered air in the colon.  Findings are likely due to a postoperative ileus.  No free air.  Surgical changes noted from a recent right hemicolectomy.  IMPRESSION: There are distended small bowel loops and to a lesser extent colon suggesting a diffuse postoperative ileus.   Original Report Authenticated By: P. Loralie Champagne, M.D.    Ct Abdomen Pelvis W Contrast  07/30/2012  *RADIOLOGY REPORT*  Clinical Data: Postoperative abdominal pain and vomiting.  Partial colectomy on 08/26.  CT ABDOMEN AND PELVIS WITH CONTRAST  Technique:  Multidetector CT imaging of the abdomen and pelvis was performed following the standard protocol during bolus administration of intravenous contrast.  Contrast: OMNIPAQUE IOHEXOL 300 MG/ML  SOLN   Comparison: CT scan 07/03/2012.  Findings: The lung bases demonstrate small bilateral pleural effusions and bibasilar atelectasis.  There is a small hiatal hernia noted.  The solid abdominal organs are unremarkable and stable.  There is a small cyst noted near the gallbladder.  No findings to suggest metastatic disease.  The gallbladder is normal.  The common bile duct is normal in caliber.  There are expected postoperative changes with a small amount of free fluid in the abdomen and pelvis along with free air.  The small bowel is slightly dilated and contains scattered air fluid levels.  Normal mucosal enhancement is demonstrated.  No transition point is identified.  The transverse colon is slightly dilated and contains air and fluid.  The left colon and sigmoid colon are decompressed.  Findings most likely due to a postoperative ileus.  No obvious obstruction or compromised bowel.  The aorta is normal in caliber.  The major branch vessels are patent.  Mild to moderate atherosclerotic calcifications.  IMPRESSION:  1.  Expected postoperative changes with free abdominal and pelvic fluid and free air. 2.  Slightly dilated fluid-filled small bowel loops with air-fluid levels but no definite obstruction or transition point.  The transverse colon is also slightly dilated with fluid and air. Findings most likely due to a postoperative ileus.   Original Report Authenticated By: P. Loralie Champagne, M.D.     Anti-infectives: Anti-infectives     Start     Dose/Rate Route Frequency Ordered Stop  07/27/12 1315   metroNIDAZOLE (FLAGYL) IVPB 500 mg     Comments: For OR      500 mg 100 mL/hr over 60 Minutes Intravenous  Once 07/27/12 1306 07/28/12 1054   07/27/12 1306   ciprofloxacin (CIPRO) IVPB 400 mg        400 mg 200 mL/hr over 60 Minutes Intravenous 120 min pre-op 07/27/12 1306 07/28/12 1054          Assessment/Plan: s/p Procedure(s) (LRB): LAPAROSCOPIC PARTIAL COLECTOMY (N/A) Advance diet Path  reviewed [id number].   Hopefully home over weekend  LOS: 4 days    Rayden Dock A. 08/01/2012

## 2012-08-02 ENCOUNTER — Inpatient Hospital Stay (HOSPITAL_COMMUNITY): Payer: Medicare Other

## 2012-08-02 LAB — CLOSTRIDIUM DIFFICILE BY PCR: Toxigenic C. Difficile by PCR: NEGATIVE

## 2012-08-02 LAB — BASIC METABOLIC PANEL
BUN: 11 mg/dL (ref 6–23)
Chloride: 101 mEq/L (ref 96–112)
Creatinine, Ser: 0.67 mg/dL (ref 0.50–1.10)
GFR calc Af Amer: >90 mL/min (ref >90)
GFR calc non Af Amer: 88 mL/min — ABNORMAL LOW (ref >90)

## 2012-08-02 LAB — CBC WITH DIFFERENTIAL/PLATELET
Basophils Absolute: 0 10*3/uL (ref 0.0–0.1)
Basophils Relative: 0 % (ref 0–1)
Eosinophils Absolute: 0.1 10*3/uL (ref 0.0–0.7)
HCT: 34.8 % — ABNORMAL LOW (ref 36.0–46.0)
MCH: 26.5 pg (ref 26.0–34.0)
MCHC: 31.6 g/dL (ref 30.0–36.0)
Monocytes Absolute: 0.4 10*3/uL (ref 0.1–1.0)
Monocytes Relative: 8 % (ref 3–12)
Neutro Abs: 3.3 10*3/uL (ref 1.7–7.7)
Neutrophils Relative %: 73 % (ref 43–77)
RDW: 14.2 % (ref 11.5–15.5)

## 2012-08-02 MED ORDER — PANTOPRAZOLE SODIUM 40 MG IV SOLR
40.0000 mg | INTRAVENOUS | Status: DC
  Administered 2012-08-02 – 2012-08-06 (×5): 40 mg via INTRAVENOUS
  Filled 2012-08-02 (×6): qty 40

## 2012-08-02 MED ORDER — PROMETHAZINE HCL 25 MG/ML IJ SOLN
12.5000 mg | Freq: Four times a day (QID) | INTRAMUSCULAR | Status: DC | PRN
  Administered 2012-08-02 – 2012-08-04 (×2): 12.5 mg via INTRAVENOUS
  Filled 2012-08-02 (×3): qty 1

## 2012-08-02 NOTE — Progress Notes (Signed)
5 Days Post-Op  Subjective: Started vomiting again, liquid diet and bilious. Continues to have loose stools and flatus. Not much pain. No respiratory difficulties.  Objective: Vital signs in last 24 hours: Temp:  [97.3 F (36.3 C)-97.9 F (36.6 C)] 97.3 F (36.3 C) (08/31 0604) Pulse Rate:  [93-99] 93  (08/31 0604) Resp:  [20] 20  (08/31 0604) BP: (126-129)/(60-70) 129/60 mmHg (08/31 0604) SpO2:  [95 %-97 %] 95 % (08/31 0604) Last BM Date: 08/01/12  Intake/Output from previous day: 08/30 0701 - 08/31 0700 In: 1602.9 [P.O.:360; I.V.:1242.9] Out: 102 [Urine:1; Stool:1; Blood:100] Intake/Output this shift:    General appearance: alert. Mental status normal. In no severe distress. GI: abdomen soft. Borderline distended. Minimal tenderness. Hypoactive bowel sounds. Wounds looked fine, intact.  Lab Results:  No results found for this or any previous visit (from the past 24 hour(s)).   Studies/Results: @RISRSLT24 @     . antiseptic oral rinse  15 mL Mouth Rinse q12n4p  . chlorhexidine  15 mL Mouth Rinse BID  . enoxaparin (LOVENOX) injection  40 mg Subcutaneous Q24H  . lisinopril  20 mg Oral Daily  . pantoprazole  40 mg Oral Q1200  . simvastatin  20 mg Oral q1800     Assessment/Plan: s/p Procedure(s): LAPAROSCOPIC PARTIAL COLECTOMY  POD #5. Laparoscopic-assisted right colectomy. Postop ileus with recurrent vomiting. Discontinue narcotics Check abdominal films Recheck labs  Increase IV fluids NPO Check C.Diff(doubt)  Patient Active Hospital Problem List: No active hospital problems.   LOS: 5 days    Linda Grant M 08/02/2012  . .prob

## 2012-08-03 MED ORDER — KETOROLAC TROMETHAMINE 15 MG/ML IJ SOLN: 15.0000 mg | Freq: Four times a day (QID) | INTRAMUSCULAR | Status: AC | PRN

## 2012-08-03 NOTE — Progress Notes (Signed)
6 Days Post-Op  Subjective: Feeling better. Continues to pass good amount of flatus and cervical loose stools. C. Difficile negative.  Nausea occurs, but is infrequent.   No emesis in 24 hours. Minimal pain. We have stopped narcotics.  Abdominal x-rays, however, yesterday looked like a worsening ileus with some stairstepping of small bowel.  Labs OK with WBC 4600, hemoglobin 11.0. Potassium 4.1, BUN 11, glucose 139.  Objective: Vital signs in last 24 hours: Temp:  [98.2 F (36.8 C)-98.7 F (37.1 C)] 98.2 F (36.8 C) (09/01 0539) Pulse Rate:  [84-90] 90  (09/01 0539) Resp:  [18-20] 18  (09/01 0539) BP: (124-149)/(60-73) 149/73 mmHg (09/01 0539) SpO2:  [94 %-96 %] 96 % (09/01 0539) Last BM Date: 08/02/12  Intake/Output from previous day: 08/31 0701 - 09/01 0700 In: 2791.6 [I.V.:2791.6] Out: 300 [Urine:300] Intake/Output this shift: Total I/O In: 0  Out: 250 [Urine:250]  General appearance: alert. Pleasant. In no distress. Mental status normal. GI: abdomen soft. Minimal, appropriate incisional tenderness. Minimal bowel sounds. Not distended. Wounds looked fine.  Lab Results:  Results for orders placed during the hospital encounter of 07/28/12 (from the past 24 hour(s))  CLOSTRIDIUM DIFFICILE BY PCR     Status: Normal   Collection Time   08/02/12 10:32 AM      Component Value Range   C difficile by pcr NEGATIVE  NEGATIVE     Studies/Results: @RISRSLT24 @     . antiseptic oral rinse  15 mL Mouth Rinse q12n4p  . chlorhexidine  15 mL Mouth Rinse BID  . enoxaparin (LOVENOX) injection  40 mg Subcutaneous Q24H  . lisinopril  20 mg Oral Daily  . pantoprazole (PROTONIX) IV  40 mg Intravenous Q24H  . DISCONTD: pantoprazole  40 mg Oral Q1200  . DISCONTD: simvastatin  20 mg Oral q1800     Assessment/Plan: s/p Procedure(s): LAPAROSCOPIC PARTIAL COLECTOMY   POD #6. Laparoscopic-assisted right colectomy for T3, N0 adenocarcinoma. Postop ileus seems more likely than partial  small bowel obstruction clinically. Improving. Allow clear liquids again Decrease IV rate Discontinue Reglan Toradol for pain.    LOS: 6 days    Brihany Butch M 08/03/2012  . .prob

## 2012-08-04 ENCOUNTER — Inpatient Hospital Stay (HOSPITAL_COMMUNITY): Payer: Medicare Other

## 2012-08-04 LAB — CBC WITH DIFFERENTIAL/PLATELET
Basophils Relative: 0 % (ref 0–1)
Eosinophils Absolute: 0.2 10*3/uL (ref 0.0–0.7)
MCH: 26 pg (ref 26.0–34.0)
MCHC: 30.7 g/dL (ref 30.0–36.0)
Neutrophils Relative %: 72 % (ref 43–77)
Platelets: 428 10*3/uL — ABNORMAL HIGH (ref 150–400)
RDW: 14.2 % (ref 11.5–15.5)

## 2012-08-04 LAB — GLUCOSE, CAPILLARY: Glucose-Capillary: 173 mg/dL — ABNORMAL HIGH (ref 70–99)

## 2012-08-04 LAB — COMPREHENSIVE METABOLIC PANEL
ALT: 35 U/L (ref 0–35)
Albumin: 3.6 g/dL (ref 3.5–5.2)
Alkaline Phosphatase: 76 U/L (ref 39–117)
Calcium: 9.5 mg/dL (ref 8.4–10.5)
Potassium: 4 mEq/L (ref 3.5–5.1)
Sodium: 134 mEq/L — ABNORMAL LOW (ref 135–145)
Total Protein: 7.1 g/dL (ref 6.0–8.3)

## 2012-08-04 LAB — CREATININE, SERUM: GFR calc Af Amer: >90 mL/min (ref >90)

## 2012-08-04 LAB — LIPASE, BLOOD: Lipase: 19 U/L (ref 11–59)

## 2012-08-04 MED ORDER — INSULIN ASPART 100 UNIT/ML ~~LOC~~ SOLN
0.0000 [IU] | SUBCUTANEOUS | Status: DC
  Administered 2012-08-05 (×3): 1 [IU] via SUBCUTANEOUS
  Administered 2012-08-05 – 2012-08-06 (×2): 2 [IU] via SUBCUTANEOUS
  Administered 2012-08-06: 17:00:00 via SUBCUTANEOUS
  Administered 2012-08-06 (×2): 1 [IU] via SUBCUTANEOUS
  Administered 2012-08-06: 2 [IU] via SUBCUTANEOUS
  Administered 2012-08-06: 1 [IU] via SUBCUTANEOUS
  Administered 2012-08-07 (×3): 2 [IU] via SUBCUTANEOUS

## 2012-08-04 MED ORDER — IOHEXOL 300 MG/ML  SOLN
20.0000 mL | INTRAMUSCULAR | Status: AC
  Administered 2012-08-04 (×2): 20 mL via ORAL

## 2012-08-04 MED ORDER — KCL IN DEXTROSE-NACL 20-5-0.45 MEQ/L-%-% IV SOLN
INTRAVENOUS | Status: DC
  Administered 2012-08-04: 16:00:00 via INTRAVENOUS
  Filled 2012-08-04 (×4): qty 1000

## 2012-08-04 MED ORDER — KCL IN DEXTROSE-NACL 20-5-0.45 MEQ/L-%-% IV SOLN
INTRAVENOUS | Status: AC
  Administered 2012-08-05: 08:00:00 via INTRAVENOUS
  Filled 2012-08-04 (×2): qty 1000

## 2012-08-04 MED ORDER — IOHEXOL 300 MG/ML  SOLN: 20.0000 mL | INTRAMUSCULAR | Status: AC

## 2012-08-04 MED ORDER — SODIUM CHLORIDE 0.9 % IJ SOLN
10.0000 mL | INTRAMUSCULAR | Status: DC | PRN
  Administered 2012-08-05 – 2012-08-07 (×3): 10 mL

## 2012-08-04 MED ORDER — IOHEXOL 300 MG/ML  SOLN
80.0000 mL | Freq: Once | INTRAMUSCULAR | Status: AC | PRN
  Administered 2012-08-04: 80 mL via INTRAVENOUS

## 2012-08-04 MED ORDER — FAT EMULSION 20 % IV EMUL
250.0000 mL | INTRAVENOUS | Status: AC
  Administered 2012-08-04: 250 mL via INTRAVENOUS
  Filled 2012-08-04: qty 250

## 2012-08-04 MED ORDER — ZINC TRACE METAL 1 MG/ML IV SOLN
INTRAVENOUS | Status: AC
  Administered 2012-08-04: 18:00:00 via INTRAVENOUS
  Filled 2012-08-04: qty 2000

## 2012-08-04 NOTE — Progress Notes (Signed)
7 Days Post-Op  Subjective: Has had recurrent vomiting. 2 episodes of emesis, low volume less than one cup. 2 episodes of dry heaves. Continues to have liquid stools which are C. Difficile negative. Not really having any pain or cramps. Ambulating normally.  Objective: Vital signs in last 24 hours: Temp:  [98 F (36.7 C)-99 F (37.2 C)] 98 F (36.7 C) (09/02 0616) Pulse Rate:  [80-92] 80  (09/02 0616) Resp:  [18] 18  (09/02 0616) BP: (131-146)/(62-66) 131/65 mmHg (09/02 0616) SpO2:  [93 %-97 %] 93 % (09/02 0616) Last BM Date: 08/04/12  Intake/Output from previous day: 09/01 0701 - 09/02 0700 In: 932.3 [I.V.:932.3] Out: 250 [Urine:250] Intake/Output this shift:    General appearance: she appears well. In no distress. Alert. Friendly. No distress. Vital signs stable. GI: abdomen is soft. Wounds look fine. No mass. Not really distended. Hypoactive bowel sounds.  Lab Results:  Results for orders placed during the hospital encounter of 07/28/12 (from the past 24 hour(s))  CREATININE, SERUM     Status: Abnormal   Collection Time   08/04/12  5:15 AM      Component Value Range   Creatinine, Ser 0.66  0.50 - 1.10 mg/dL   GFR calc non Af Amer 88 (*) >90 mL/min   GFR calc Af Amer >90  >90 mL/min     Studies/Results: @RISRSLT24 @     . antiseptic oral rinse  15 mL Mouth Rinse q12n4p  . chlorhexidine  15 mL Mouth Rinse BID  . enoxaparin (LOVENOX) injection  40 mg Subcutaneous Q24H  . lisinopril  20 mg Oral Daily  . pantoprazole (PROTONIX) IV  40 mg Intravenous Q24H     Assessment/Plan: s/p Procedure(s): LAPAROSCOPIC PARTIAL COLECTOMY  POD #7. Laparoscopic-assisted right colectomy for T3, N0 adenocarcinoma.  Recurrent postop vomiting. Ileus is more likely than partial small bowel obstruction. The cause for this is not clear.  We will repeat lab work and CT scan with contrast now. Will insert double-lumen picc and began TNA.  Because of her diarrhea we have discontinued  her Reglan. Continue Toradol for pain and avoid narcotics.  Patient Active Hospital Problem List: No active hospital problems.   LOS: 7 days    Linda Grant M 08/04/2012  . .prob

## 2012-08-04 NOTE — Progress Notes (Signed)
PARENTERAL NUTRITION CONSULT NOTE - INITIAL  Pharmacy Consult for TPN Indication: Prolonged ileus  Allergies  Allergen Reactions  . Penicillins Anaphylaxis    Patient Measurements: Height: 5' (152.4 cm) Weight: 152 lb 1.9 oz (69 kg) IBW/kg (Calculated) : 45.5  Adjusted Body Weight: 55 Usual Weight: 69 kg  Vital Signs: Temp: 98 F (36.7 C) (09/02 0616) Temp src: Oral (09/02 0616) BP: 131/65 mmHg (09/02 0616) Pulse Rate: 80  (09/02 0616) Intake/Output from previous day: 09/01 0701 - 09/02 0700 In: 932.3 [I.V.:932.3] Out: 250 [Urine:250] Intake/Output from this shift:    Labs:  [facility] 08/04/12 1004 08/02/12 0852  WBC 7.2 4.6  HGB 11.2* 11.0*  HCT 36.5 34.8*  PLT 428* 338  APTT -- --  INR -- --     Basename 08/04/12 0515 08/02/12 0852  NA -- 133*  K -- 4.1  CL -- 101  CO2 -- 22  GLUCOSE -- 139*  BUN -- 11  CREATININE 0.66 0.67  LABCREA -- --  [id number] -- --  CALCIUM -- 9.1  MG -- --  PHOS -- --  PROT -- --  ALBUMIN -- --  AST -- --  ALT -- --  ALKPHOS -- --  BILITOT -- --  BILIDIR -- --  IBILI -- --  PREALBUMIN -- --  TRIG -- --  CHOLHDL -- --  CHOL -- --   Estimated Creatinine Clearance: 57.5 ml/min (by C-G formula based on Cr of 0.66).   No results found for this basename: GLUCAP:3 in the last 72 hours  Medical History: Past Medical History  Diagnosis Date  . Elevated cholesterol   . Hypertension   . GERD (gastroesophageal reflux disease)   . Vitamin d deficiency   . Hyperplastic colon polyp   . Diverticulosis   . Hx of adenomatous colonic polyps 07/02/12  . Anemia   . 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 Diabetes mellitus     type 2 - controlled  . Mitral valve prolapse   . Rectal bleeding   . Blood in stool   . Heart murmur   . Arthritis     Medications:  Prescriptions prior to admission  Medication Sig Dispense Refill  . aspirin 81 MG tablet Take 81 mg by mouth daily.      . Cholecalciferol (VITAMIN D) 2000 UNITS CAPS  Take 1 capsule by mouth daily.       . ferrous sulfate 325 (65 FE) MG tablet Take 325 mg by mouth daily with breakfast.      . folic acid (FOLVITE) 1 MG tablet Take 1 mg by mouth daily.      Marland Kitchen lisinopril (PRINIVIL,ZESTRIL) 20 MG tablet Take 20 mg by mouth daily.      Marland Kitchen lovastatin (MEVACOR) 40 MG tablet Take 40 mg by mouth at bedtime.      Marland Kitchen omeprazole (PRILOSEC) 20 MG capsule Take 20 mg by mouth daily.        Insulin Requirements in the past 24 hours:  None  CurrentNutrition:  Clear liquid diet ordered 9/1. No intake noted.  Assessment: Mass found in colon at cecum 8/13 = adenocarcinoma with laparoscopic assisted partial colectomy 8/26. Patient with post-op N/V/D not tolerating diet. Cdiff negative. No substantial nutritional intake in 7 days. Could be prolonged ileus vs pSBO. Reglan d/c'd. Patient remains on IV PPI.  Nutritional Goals:  1650 kCal, 55-75 grams of protein per day (await RD goals)  Plan:  Add CBG checks and SSI Decrease IV to 75ml/hr when  TPN starts. Start Clinimix E 5/15 at 24ml/hr and titrate up to RD established goals. Lipids at 10 ml/hr + MVI/select TE on MWF only due to national shortage Baseline labs in am.  Kristopher Delk S. Merilynn Finland, PharmD, BCPS Clinical Staff Pharmacist Pager [phone]  Misty Stanley Stillinger 08/04/2012,11:18 AM

## 2012-08-04 NOTE — Progress Notes (Signed)
Peripherally Inserted Central Catheter/Midline Placement  The IV Nurse has discussed with the patient and/or persons authorized to consent for the patient, the purpose of this procedure and the potential benefits and risks involved with this procedure.  The benefits include less needle sticks, lab draws from the catheter and patient may be discharged home with the catheter.  Risks include, but not limited to, infection, bleeding, blood clot (thrombus formation), and puncture of an artery; nerve damage and irregular heat beat.  Alternatives to this procedure were also discussed.  Placed by Stacie Glaze, RN.  PICC/Midline Placement Documentation  PICC / Midline Double Lumen 08/04/12 PICC Right Basilic (Active)       Linda Grant, [name] 08/04/2012, 5:52 PM

## 2012-08-04 NOTE — Progress Notes (Signed)
INITIAL ADULT NUTRITION ASSESSMENT Date: 08/04/2012   Time: 12:10 PM Reason for Assessment: TPN Consult  INTERVENTION: Recommend adjustments to TPN as needed.    ASSESSMENT: Female 71 y.o.  Dx: Adenocarcinoma of cecum  Hx:  Past Medical History  Diagnosis Date  . Elevated cholesterol   . Hypertension   . GERD (gastroesophageal reflux disease)   . Vitamin d deficiency   . Hyperplastic colon polyp   . Diverticulosis   . Hx of adenomatous colonic polyps 07/02/12  . Anemia   . 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 Diabetes mellitus     type 2 - controlled  . Mitral valve prolapse   . Rectal bleeding   . Blood in stool   . Heart murmur   . Arthritis    Past Surgical History  Procedure Date  . Vaginal hysterectomy   . Mouth surgery     dental implants  . Dental surgery 1995    Dental Implants  . Partial colectomy 07/28/2012   Related Meds:     . antiseptic oral rinse  15 mL Mouth Rinse q12n4p  . chlorhexidine  15 mL Mouth Rinse BID  . enoxaparin (LOVENOX) injection  40 mg Subcutaneous Q24H  . insulin aspart  0-9 Units Subcutaneous Q4H  . iohexol  20 mL Oral Q1 Hr x 2  . iohexol  20 mL Oral Q1 Hr x 2  . lisinopril  20 mg Oral Daily  . pantoprazole (PROTONIX) IV  40 mg Intravenous Q24H   Ht: 5' (152.4 cm)  Wt: 152 lb 1.9 oz (69 kg)  Ideal Wt: 45.4 kg % Ideal Wt: 152%  Usual Wt: 158 lbs  Wt Readings from Last 10 Encounters:  07/30/12 152 lb 1.9 oz (69 kg)  07/30/12 152 lb 1.9 oz (69 kg)  07/23/12 152 lb 8 oz (69.174 kg)  07/10/12 153 lb 2 oz (69.457 kg)  07/04/12 152 lb 6.4 oz (69.128 kg)  07/02/12 153 lb (69.4 kg)  06/30/12 153 lb 6.4 oz (69.582 kg)   % Usual Wt: 96%  Body mass index is 29.71 kg/(m^2). Overweight  Food/Nutrition Related Hx: Pt reports 4% weight loss in 2 months after the passing of her brother for which she was his primary cg.   Labs:  CMP     Component Value Date/Time   NA 134* 08/04/2012 1004   K 4.0 08/04/2012 1004   CL 103 08/04/2012  1004   CO2 20 08/04/2012 1004   GLUCOSE 135* 08/04/2012 1004   BUN 7 08/04/2012 1004   CREATININE 0.66 08/04/2012 1004   CALCIUM 9.5 08/04/2012 1004   PROT 7.1 08/04/2012 1004   ALBUMIN 3.6 08/04/2012 1004   AST 25 08/04/2012 1004   ALT 35 08/04/2012 1004   ALKPHOS 76 08/04/2012 1004   BILITOT 0.3 08/04/2012 1004   GFRNONAA 88* 08/04/2012 1004   GFRAA >90 08/04/2012 1004  CBG (last 3)  No results found for this basename: GLUCAP:3 in the last 72 hours Sodium  Date/Time Value Range Status  08/04/2012 10:04 AM 134* 135 - 145 mEq/L Final  08/02/2012  8:52 AM 133* 135 - 145 mEq/L Final  07/31/2012  7:00 AM 134* 135 - 145 mEq/L Final    Potassium  Date/Time Value Range Status  08/04/2012 10:04 AM 4.0  3.5 - 5.1 mEq/L Final  08/02/2012  8:52 AM 4.1  3.5 - 5.1 mEq/L Final  07/31/2012  7:00 AM 4.6  3.5 - 5.1 mEq/L Final    No results found for  this basename: phos    No results found for this basename: mg    Intake/Output Summary (Last 24 hours) at 08/04/12 1212 Last data filed at 08/03/12 1558  Gross per 24 hour  Intake 932.33 ml  Output      0 ml  Net 932.33 ml    Last BM: 08/04/12  Diet Order: Clear Liquid  Supplements/Tube Feeding: TPN and lipids  IVF:    dextrose 5 % and 0.45 % NaCl with KCl 20 mEq/L   dextrose 5 % and 0.45 % NaCl with KCl 20 mEq/L Last Rate: 100 mL/hr at 08/04/12 1201  TPN (CLINIMIX) +/- additives   And   fat emulsion   DISCONTD: dextrose 5 % and 0.45 % NaCl with KCl 20 mEq/L Last Rate: 100 mL/hr at 08/04/12 1059   She underwent colonoscopy which revealed a mass in the cecum biopsy-proven to be adenocarcinoma Pt POD #6 s/p Laparoscopic assisted partial colectomy. Pt with postop ileus with recurrent vomiting. TPN ordered.   Patient to start TPN with Clinimix E 5/15 @ 40 ml/hr.  Lipids (20% IVFE @ 10 ml/hr), multivitamins, and trace elements are provided 3 times weekly (MWF) due to national backorder.  Provides 886 kcal and 48 grams protein daily (based on weekly average).     Estimated Nutritional Needs:   Kcal: 1600-1800 Protein:  70-80 grams Fluid:  >1.6 L/day  NUTRITION DIAGNOSIS: -Altered GI function (NI-1.4).  Status: Ongoing  RELATED TO: post op ileus  AS EVIDENCE BY: N/V  MONITORING/EVALUATION(Goals): Goal: Pt to meet >/= 90% of their estimated nutrition needs. Monitor: TPN advancement, ability to advance and tolerate diet, weight  EDUCATION NEEDS: -Education needs addressed. Reviewed high protein sources with pt.   DOCUMENTATION CODES Per approved criteria  -Not Applicable    Kendell Bane RD, LDN, CNSC [phone] Pager [phone] After Hours Pager  08/04/2012, 12:10 PM

## 2012-08-05 ENCOUNTER — Inpatient Hospital Stay (HOSPITAL_COMMUNITY): Payer: Medicare Other

## 2012-08-05 LAB — GLUCOSE, CAPILLARY
Glucose-Capillary: 135 mg/dL — ABNORMAL HIGH (ref 70–99)
Glucose-Capillary: 119 mg/dL — ABNORMAL HIGH (ref 70–99)
Glucose-Capillary: 140 mg/dL — ABNORMAL HIGH (ref 70–99)

## 2012-08-05 LAB — DIFFERENTIAL
Eosinophils Absolute: 0.2 10*3/uL (ref 0.0–0.7)
Lymphocytes Relative: 20 % (ref 12–46)
Lymphs Abs: 1 10*3/uL (ref 0.7–4.0)
Neutrophils Relative %: 65 % (ref 43–77)

## 2012-08-05 LAB — COMPREHENSIVE METABOLIC PANEL
Albumin: 3 g/dL — ABNORMAL LOW (ref 3.5–5.2)
BUN: 9 mg/dL (ref 6–23)
Calcium: 9.1 mg/dL (ref 8.4–10.5)
Creatinine, Ser: 0.63 mg/dL (ref 0.50–1.10)
Total Protein: 6.1 g/dL (ref 6.0–8.3)

## 2012-08-05 LAB — MAGNESIUM: Magnesium: 2 mg/dL (ref 1.5–2.5)

## 2012-08-05 LAB — CBC
MCH: 26.4 pg (ref 26.0–34.0)
Platelets: 330 10*3/uL (ref 150–400)
RBC: 3.9 MIL/uL (ref 3.87–5.11)
WBC: 5.3 10*3/uL (ref 4.0–10.5)

## 2012-08-05 LAB — CHOLESTEROL, TOTAL: Cholesterol: 128 mg/dL (ref 0–200)

## 2012-08-05 LAB — PREALBUMIN: Prealbumin: 24.2 mg/dL (ref 17.0–34.0)

## 2012-08-05 LAB — PHOSPHORUS: Phosphorus: 3.9 mg/dL (ref 2.3–4.6)

## 2012-08-05 MED ORDER — CLINIMIX E/DEXTROSE (5/15) 5 % IV SOLN
INTRAVENOUS | Status: AC
  Administered 2012-08-05: 17:00:00 via INTRAVENOUS
  Filled 2012-08-05: qty 2000

## 2012-08-05 MED ORDER — ACETAMINOPHEN 325 MG PO TABS: 650.0000 mg | ORAL_TABLET | Freq: Four times a day (QID) | ORAL | Status: DC | PRN

## 2012-08-05 MED ORDER — KCL IN DEXTROSE-NACL 20-5-0.45 MEQ/L-%-% IV SOLN
INTRAVENOUS | Status: DC
  Filled 2012-08-05: qty 1000

## 2012-08-05 MED ORDER — POTASSIUM CHLORIDE 10 MEQ/50ML IV SOLN
10.0000 meq | INTRAVENOUS | Status: AC
  Administered 2012-08-05 (×4): 10 meq via INTRAVENOUS
  Filled 2012-08-05 (×4): qty 50

## 2012-08-05 NOTE — Progress Notes (Signed)
PARENTERAL NUTRITION CONSULT NOTE - Follow-up  Pharmacy Consult for TPN Indication: Prolonged ileus  Allergies  Allergen Reactions  . Penicillins Anaphylaxis    Patient Measurements: Height: 5' (152.4 cm) Weight: 152 lb 1.9 oz (69 kg) IBW/kg (Calculated) : 45.5  Adjusted Body Weight: 55 Usual Weight: 69 kg  Vital Signs: Temp: 98.3 F (36.8 C) (09/03 0513) Temp src: Oral (09/03 0513) BP: 124/73 mmHg (09/03 0513) Pulse Rate: 83  (09/03 0513) Intake/Output from previous day: 09/02 0701 - 09/03 0700 In: 1450 [P.O.:770; I.V.:680] Out: -  Intake/Output from this shift:    Labs:  Basename 08/05/12 0532 08/04/12 1004  WBC 5.3 7.2  HGB 10.3* 11.2*  HCT 33.1* 36.5  PLT 330 428*  APTT -- --  INR -- --     Basename 08/05/12 0532 08/04/12 1004 08/04/12 0515  NA 137 134* --  K 3.5 4.0 --  CL 106 103 --  CO2 21 20 --  GLUCOSE 136* 135* --  BUN 9 7 --  CREATININE 0.63 0.66 0.66  LABCREA -- -- --  CREAT24HRUR -- -- --  CALCIUM 9.1 9.5 --  MG 2.0 -- --  PHOS 3.9 -- --  PROT 6.1 7.1 --  ALBUMIN 3.0* 3.6 --  AST 18 25 --  ALT 29 35 --  ALKPHOS 67 76 --  BILITOT 0.1* 0.3 --  BILIDIR -- -- --  IBILI -- -- --  PREALBUMIN -- -- --  TRIG 213* -- --  CHOLHDL -- -- --  CHOL 128 -- --   Estimated Creatinine Clearance: 57.5 ml/min (by C-G formula based on Cr of 0.63).    Basename 08/05/12 1149 08/05/12 0739 08/05/12 0340  GLUCAP 110* 144* 135*    Insulin Requirements in the past 24 hours:  4 units SSI since TPN started  CurrentNutrition:  Clear liquid diet ordered 9/1. No intake documented. 48gm Protein and 1180 Kcal on day #1 TPN  Nutritional Goals:  1600-1800 kCal, 70-80 grams of protein per day per RD assessment  Assessment: Mass found in colon at cecum 8/13 = adenocarcinoma with laparoscopic assisted partial colectomy 8/26. Patient with post-op N/V/D not tolerating diet. Cdiff negative. No substantial nutritional intake in 7 days. Could be prolonged ileus  vs pSBO. Reglan d/c'd. Patient remains on IV PPI.  GI: Clears ordered, but patient not taking any d/t nausea. Noted +BM (loose stools) Endo: CBG controlled- minimal SSI usage, no hx DM. TPN does not contain insulin. LytesJudieth Keens are WNL- would target K ~4 with ileus. Ca corrects to 9.9. Noted MIVF with D51/2NS with 20 K at 150ml/hr Renal: Scr WNL, UOP not accurately documented, looks adequate Pulm: RA Cards: VSS Hepatobil: LFTs WNL, prealbumin pending. Neuro: GCS 15, pain 0. ID:  Afeb, WBC WNL, no abx on board  Best Practices: LMWH, IV PPI  Plan:  - Continue SSI for now, decrease MIVF to KVO at 1800 today - Increase TPN to goal rate of 65cc/hr with bag tonight (provides 78gm protein and avg of 1607 Kcal). - Supplement lipids at 10 ml/hr + MVI/select TE on MWF only due to national shortage - Will f/up prealbumin in AM, BMET, Mg and Phos in AM as well. - Will give 4 runs of KCL now  Thanks, Lazer Wollard K. Allena Katz, PharmD, BCPS.  Clinical Pharmacist Pager [phone]. 08/05/2012 1:01 PM

## 2012-08-05 NOTE — Progress Notes (Signed)
8 Days Post-Op  Subjective: Occasional nausea.  Loose stools.  Unable to tolerate diet.  Objective: Vital signs in last 24 hours: Temp:  [98.3 F (36.8 C)-98.7 F (37.1 C)] 98.3 F (36.8 C) (09/03 0513) Pulse Rate:  [83] 83  (09/03 0513) Resp:  [16-18] 18  (09/03 0513) BP: (115-134)/(51-73) 124/73 mmHg (09/03 0513) SpO2:  [94 %-97 %] 97 % (09/03 0513) Last BM Date: 08/04/12  Intake/Output from previous day: 09/02 0701 - 09/03 0700 In: 1450 [P.O.:770; I.V.:680] Out: -  Intake/Output this shift:    Incision/Wound:quiet abdomen.  Soft not tender. Wound clean.  Lab Results:   Basename 08/05/12 0532 08/04/12 1004  WBC 5.3 7.2  HGB 10.3* 11.2*  HCT 33.1* 36.5  PLT 330 428*   BMET  Basename 08/05/12 0532 08/04/12 1004  NA 137 134*  K 3.5 4.0  CL 106 103  CO2 21 20  GLUCOSE 136* 135*  BUN 9 7  CREATININE 0.63 0.66  CALCIUM 9.1 9.5   PT/INR No results found for this basename: LABPROT:2,INR:2 in the last 72 hours ABG No results found for this basename: PHART:2,PCO2:2,PO2:2,HCO3:2 in the last 72 hours  Studies/Results: Ct Abdomen Pelvis W Contrast  08/04/2012  *RADIOLOGY REPORT*  Clinical Data: 1 week postop colonic resection for colon cancer. Worsening postoperative ileus versus bowel obstruction.  CT ABDOMEN AND PELVIS WITH CONTRAST  Technique:  Multidetector CT imaging of the abdomen and pelvis was performed following the standard protocol during bolus administration of intravenous contrast.  Contrast: 80mL OMNIPAQUE IOHEXOL 300 MG/ML. Oral contrast was also administered.  Comparison: CT abdomen and pelvis 07/30/2012, 07/03/2012, 10/07/2009.  Findings: Moderately distended small bowel loops with fluid and air throughout the abdomen.  Liquid stool throughout normal caliber colon; distal descending and sigmoid colon diverticulosis without evidence of acute diverticulitis.  No evidence of leak at the ileocolic anastomosis in the right mid abdomen.  No free intraperitoneal air.   Grant mildly dilated loop of jejunum is present in the lesser sac, consistent with an internal hernia, unchanged from the examination 5 days ago.  Small hiatal hernia again noted. Stomach decompressed otherwise unremarkable.  Interval resolution of ascites since the most recent examination.  Stable cyst in the anterior segment right lobe of liver; no significant focal hepatic parenchymal abnormalities.  Normal appearing spleen, pancreas, adrenal glands, and kidneys. Gallbladder unremarkable by CT.  No biliary ductal dilation. Moderate aorto-iliac atherosclerosis without aneurysm.  No significant lymphadenopathy.  Uterus surgically absent.  No adnexal masses or free pelvic fluid. Urinary bladder decompressed and unremarkable.  Bone window images demonstrate degenerative changes involving the lower thoracic and lumbar spine.  Small right pleural effusion, decreased in size since the most recent prior examination. Interval resolution of the left pleural effusion.  Minimal passive atelectasis in the right lower lobe.  Lung bases otherwise clear. Heart size normal.  IMPRESSION:  1.  Mild dilation of the entire small bowel to the level of the ileocolic anastomosis without Grant transition point.  Liquid stool throughout normal caliber colon from cecum to rectum. No evidence of an anastomotic leak.  No free intraperitoneal air. 2.  Internal hernia, as there is Grant loop of jejunum in the lesser sac.  This was present on the prior examination. 3.  Descending and sigmoid colon diverticulosis without evidence of acute diverticulitis.  4.  Interval resolution of ascites since the examination 5 days ago. 6.  Stable small hiatal hernia. 7.  Small right pleural effusion, decreased in size since the examination 5  days ago.  Interval resolution of the left pleural effusion.   Original Report Authenticated By: Arnell Sieving, M.D.     Anti-infectives: Anti-infectives     Start     Dose/Rate Route Frequency Ordered Stop   07/27/12  1315   metroNIDAZOLE (FLAGYL) IVPB 500 mg     Comments: For OR      500 mg 100 mL/hr over 60 Minutes Intravenous  Once 07/27/12 1306 07/28/12 1054   07/27/12 1306   ciprofloxacin (CIPRO) IVPB 400 mg        400 mg 200 mL/hr over 60 Minutes Intravenous 120 min pre-op 07/27/12 1306 07/28/12 1054          Assessment/Plan: s/p Procedure(s) (LRB): LAPAROSCOPIC PARTIAL COLECTOMY (N/Grant) Postoperative ileus. Cont TNA  ALLOW clears ambulate  LOS: 8 days    Linda Grant. 08/05/2012

## 2012-08-06 DIAGNOSIS — E46 Unspecified protein-calorie malnutrition: Secondary | ICD-10-CM

## 2012-08-06 LAB — BASIC METABOLIC PANEL
BUN: 12 mg/dL (ref 6–23)
CO2: 21 mEq/L (ref 19–32)
Chloride: 105 mEq/L (ref 96–112)
Creatinine, Ser: 0.55 mg/dL (ref 0.50–1.10)
Glucose, Bld: 140 mg/dL — ABNORMAL HIGH (ref 70–99)
Potassium: 3.9 mEq/L (ref 3.5–5.1)

## 2012-08-06 LAB — GLUCOSE, CAPILLARY
Glucose-Capillary: 135 mg/dL — ABNORMAL HIGH (ref 70–99)
Glucose-Capillary: 149 mg/dL — ABNORMAL HIGH (ref 70–99)
Glucose-Capillary: 192 mg/dL — ABNORMAL HIGH (ref 70–99)

## 2012-08-06 MED ORDER — SODIUM CHLORIDE 0.9 % IV SOLN
INTRAVENOUS | Status: DC
  Administered 2012-08-06: 13:00:00 via INTRAVENOUS

## 2012-08-06 MED ORDER — POTASSIUM CHLORIDE CRYS ER 10 MEQ PO TBCR
20.0000 meq | EXTENDED_RELEASE_TABLET | Freq: Once | ORAL | Status: AC
  Administered 2012-08-06: 20 meq via ORAL
  Filled 2012-08-06: qty 2

## 2012-08-06 MED ORDER — FAT EMULSION 20 % IV EMUL
250.0000 mL | INTRAVENOUS | Status: AC
  Administered 2012-08-06: 250 mL via INTRAVENOUS
  Filled 2012-08-06: qty 250

## 2012-08-06 MED ORDER — ZINC TRACE METAL 1 MG/ML IV SOLN
INTRAVENOUS | Status: AC
  Administered 2012-08-06: 19:00:00 via INTRAVENOUS
  Filled 2012-08-06: qty 2000

## 2012-08-06 NOTE — Progress Notes (Signed)
9 Days Post-Op  Subjective: Some crampy pain. Not severe. MULTIPLE bm.  Objective: Vital signs in last 24 hours: Temp:  [97.5 F (36.4 C)-98.4 F (36.9 C)] 97.5 F (36.4 C) (09/04 0524) Pulse Rate:  [78-87] 78  (09/04 0524) Resp:  [18] 18  (09/04 0524) BP: (116-124)/(50-62) 122/56 mmHg (09/04 0524) SpO2:  [97 %-98 %] 97 % (09/04 0524) Last BM Date: 08/05/12  Intake/Output from previous day: 09/03 0701 - 09/04 0700 In: 2505.5 [I.V.:1363; TPN:1142.5] Out: -  Intake/Output this shift:    Incision/Wound:clean soft abdomen.  BS more active today.  Lab Results:   Basename 08/05/12 0532 08/04/12 1004  WBC 5.3 7.2  HGB 10.3* 11.2*  HCT 33.1* 36.5  PLT 330 428*   BMET  Basename 08/06/12 0500 08/05/12 0532  NA 138 137  K 3.9 3.5  CL 105 106  CO2 21 21  GLUCOSE 140* 136*  BUN 12 9  CREATININE 0.55 0.63  CALCIUM 9.3 9.1   PT/INR No results found for this basename: LABPROT:2,INR:2 in the last 72 hours ABG No results found for this basename: PHART:2,PCO2:2,PO2:2,HCO3:2 in the last 72 hours  Studies/Results: Dg Abd 1 View  08/05/2012  *RADIOLOGY REPORT*  Clinical Data: Ileus.  Upper abdominal pain.  Colon resection surgery 8 days ago.  ABDOMEN - 1 VIEW  Comparison: CT abdomen pelvis 08/04/2012  Findings: There are multiple dilated small bowel loops, measuring up to 3.6 cm in caliber.  Anastomotic suture is seen in the right lower quadrant.  Small amount of gas is seen in the rectum. Vertical line of staples projects over the mid abdomen.  IMPRESSION: Persistently dilated small bowel loops.  Findings could reflect postoperative ileus or bowel obstruction.   Original Report Authenticated By: Britta Mccreedy, M.D.    Ct Abdomen Pelvis W Contrast  08/04/2012  *RADIOLOGY REPORT*  Clinical Data: 1 week postop colonic resection for colon cancer. Worsening postoperative ileus versus bowel obstruction.  CT ABDOMEN AND PELVIS WITH CONTRAST  Technique:  Multidetector CT imaging of the abdomen  and pelvis was performed following the standard protocol during bolus administration of intravenous contrast.  Contrast: 80mL OMNIPAQUE IOHEXOL 300 MG/ML. Oral contrast was also administered.  Comparison: CT abdomen and pelvis 07/30/2012, 07/03/2012, 10/07/2009.  Findings: Moderately distended small bowel loops with fluid and air throughout the abdomen.  Liquid stool throughout normal caliber colon; distal descending and sigmoid colon diverticulosis without evidence of acute diverticulitis.  No evidence of leak at the ileocolic anastomosis in the right mid abdomen.  No free intraperitoneal air.  A mildly dilated loop of jejunum is present in the lesser sac, consistent with an internal hernia, unchanged from the examination 5 days ago.  Small hiatal hernia again noted. Stomach decompressed otherwise unremarkable.  Interval resolution of ascites since the most recent examination.  Stable cyst in the anterior segment right lobe of liver; no significant focal hepatic parenchymal abnormalities.  Normal appearing spleen, pancreas, adrenal glands, and kidneys. Gallbladder unremarkable by CT.  No biliary ductal dilation. Moderate aorto-iliac atherosclerosis without aneurysm.  No significant lymphadenopathy.  Uterus surgically absent.  No adnexal masses or free pelvic fluid. Urinary bladder decompressed and unremarkable.  Bone window images demonstrate degenerative changes involving the lower thoracic and lumbar spine.  Small right pleural effusion, decreased in size since the most recent prior examination. Interval resolution of the left pleural effusion.  Minimal passive atelectasis in the right lower lobe.  Lung bases otherwise clear. Heart size normal.  IMPRESSION:  1.  Mild dilation  of the entire small bowel to the level of the ileocolic anastomosis without a transition point.  Liquid stool throughout normal caliber colon from cecum to rectum. No evidence of an anastomotic leak.  No free intraperitoneal air. 2.   Internal hernia, as there is a loop of jejunum in the lesser sac.  This was present on the prior examination. 3.  Descending and sigmoid colon diverticulosis without evidence of acute diverticulitis.  4.  Interval resolution of ascites since the examination 5 days ago. 6.  Stable small hiatal hernia. 7.  Small right pleural effusion, decreased in size since the examination 5 days ago.  Interval resolution of the left pleural effusion.   Original Report Authenticated By: Arnell Sieving, M.D.     Anti-infectives: Anti-infectives     Start     Dose/Rate Route Frequency Ordered Stop   07/27/12 1315   metroNIDAZOLE (FLAGYL) IVPB 500 mg     Comments: For OR      500 mg 100 mL/hr over 60 Minutes Intravenous  Once 07/27/12 1306 07/28/12 1054   07/27/12 1306   ciprofloxacin (CIPRO) IVPB 400 mg        400 mg 200 mL/hr over 60 Minutes Intravenous 120 min pre-op 07/27/12 1306 07/28/12 1054          Assessment/Plan: s/p Procedure(s) (LRB) with comments: LAPAROSCOPIC PARTIAL COLECTOMY (N/A) - laparoscopic assisted partial colectomy Severe post op ileus. Protein calorie malnutrition  Mild  Cont TNA Keep on clears.    LOS: 9 days    Linda Grant A. 08/06/2012

## 2012-08-06 NOTE — Progress Notes (Signed)
DRESSING CHANGE NO NOTED DRAINAGE, SITE WNL. PT TOLERATED AN ICEE. PT ADMITTED TO PAIN, BUT REFUSED PAIN MEDS DURING SHIFT.

## 2012-08-06 NOTE — Progress Notes (Signed)
PARENTERAL NUTRITION CONSULT NOTE - Follow-up  Pharmacy Consult for TPN Indication: Prolonged ileus  Allergies  Allergen Reactions  . Penicillins Anaphylaxis    Patient Measurements: Height: 5' (152.4 cm) Weight: 152 lb 1.9 oz (69 kg) IBW/kg (Calculated) : 45.5  Adjusted Body Weight: 55 Usual Weight: 69 kg  Vital Signs: Temp: 97.5 F (36.4 C) (09/04 0524) Temp src: Oral (09/04 0524) BP: 122/56 mmHg (09/04 0524) Pulse Rate: 78  (09/04 0524) Intake/Output from previous day: 09/03 0701 - 09/04 0700 In: 2505.5 [I.V.:1363; TPN:1142.5] Out: -  Intake/Output from this shift: Total I/O In: 240 [P.O.:240] Out: -   Labs:  Basename 08/05/12 0532 08/04/12 1004  WBC 5.3 7.2  HGB 10.3* 11.2*  HCT 33.1* 36.5  PLT 330 428*  APTT -- --  INR -- --     Basename 08/06/12 0500 08/05/12 0532 08/04/12 1004  NA 138 137 134*  K 3.9 3.5 4.0  CL 105 106 103  CO2 21 21 20   GLUCOSE 140* 136* 135*  BUN 12 9 7   CREATININE 0.55 0.63 0.66  LABCREA -- -- --  CREAT24HRUR -- -- --  CALCIUM 9.3 9.1 9.5  MG 2.1 2.0 --  PHOS 3.8 3.9 --  PROT -- 6.1 7.1  ALBUMIN -- 3.0* 3.6  AST -- 18 25  ALT -- 29 35  ALKPHOS -- 67 76  BILITOT -- 0.1* 0.3  BILIDIR -- -- --  IBILI -- -- --  PREALBUMIN -- 24.2 --  TRIG -- 213* --  CHOLHDL -- -- --  CHOL -- 128 --   Estimated Creatinine Clearance: 57.5 ml/min (by C-G formula based on Cr of 0.55).    Basename 08/06/12 0800 08/06/12 0315 08/05/12 2337  GLUCAP 143* 192* 149*    Insulin Requirements in the past 24 hours:  5 units SSI and no insulin in TPN  CurrentNutrition:  Clear liquid diet ordered 9/1. on water/juice intake 9/4 AM, pt ready to advance diet, MD wants to wait Clinimix E 5/15 at 65 mL/hr provides 78gm protein/d and 1321 avg daily Kcal from TPN  Nutritional Goals:  1600-1800 kCal, 70-80 grams of protein per day per RD assessment  Assessment: Mass found in colon at cecum 8/13 = adenocarcinoma with laparoscopic assisted  partial colectomy 8/26. Patient with post-op N/V/D not tolerating diet. Cdiff negative. No substantial nutritional intake in 7 days. Could be prolonged ileus vs pSBO. Reglan d/c'd. Patient remains on IV PPI.  GI: Clears ordered, but patient refuses, would rather her diet be advanced. Noted multiple BM 9/3 -9/4 (loose stools). 9/3 abd X-ray shows persistently dilated small bowel loops, postoperative ileus or bowel obstruction. Endo: CBG controlled- minimal SSI usage, no hx DM. TPN does not contain insulin. LytesJudieth Keens are WNL- would target K ~4 with ileus. Ca corrects to 10.1. Noted MIVF with D51/2NS with 20 K at 56ml/hr Renal: Scr WNL, UOP not accurately documented, looks adequate Pulm: RA Cards: VSS Hepatobil: LFTs WNL, prealb 24.2. TG elevated at 213 Neuro: GCS 15, pain 0. ID:  Afeb, WBC WNL, no abx on board  Heme: H/H low stable, plts ok Best Practices: LMWH, IV PPI  Plan:  1. Continue SSI for now, change MIVF to NS at [facility] 2. F/up CBGs for insulin requirements 3. Change TPN to Clinimix 5/20 at 65cc/hr with bag tonight (provides 78gm protein and avg of 1587 Kcal). 4. Supplement lipids at 10 ml/hr + MVI/select TE on MWF only due to national shortage 5. Will f/up CMET, Mg and Phos  in AM 6. Give potassium 20 mEq PO today 7. F/up triglycerides on Monday 9/9  Thank you for the consult.  Tomi Bamberger, PharmD Clinical Pharmacist Pager: [phone] Pharmacy: [phone] 08/06/2012 11:58 AM

## 2012-08-07 ENCOUNTER — Other Ambulatory Visit (INDEPENDENT_AMBULATORY_CARE_PROVIDER_SITE_OTHER): Payer: Self-pay

## 2012-08-07 DIAGNOSIS — C189 Malignant neoplasm of colon, unspecified: Secondary | ICD-10-CM

## 2012-08-07 LAB — GLUCOSE, CAPILLARY
Glucose-Capillary: 162 mg/dL — ABNORMAL HIGH (ref 70–99)
Glucose-Capillary: 138 mg/dL — ABNORMAL HIGH (ref 70–99)
Glucose-Capillary: 121 mg/dL — ABNORMAL HIGH (ref 70–99)

## 2012-08-07 LAB — COMPREHENSIVE METABOLIC PANEL
AST: 15 U/L (ref 0–37)
Albumin: 3.2 g/dL — ABNORMAL LOW (ref 3.5–5.2)
Alkaline Phosphatase: 73 U/L (ref 39–117)
BUN: 14 mg/dL (ref 6–23)
CO2: 23 mEq/L (ref 19–32)
Chloride: 103 mEq/L (ref 96–112)
Creatinine, Ser: 0.55 mg/dL (ref 0.50–1.10)
GFR calc non Af Amer: >90 mL/min (ref >90)
Potassium: 3.9 mEq/L (ref 3.5–5.1)
Total Bilirubin: 0.1 mg/dL — ABNORMAL LOW (ref 0.3–1.2)

## 2012-08-07 LAB — PHOSPHORUS: Phosphorus: 3.6 mg/dL (ref 2.3–4.6)

## 2012-08-07 LAB — MAGNESIUM: Magnesium: 2.2 mg/dL (ref 1.5–2.5)

## 2012-08-07 MED ORDER — PANTOPRAZOLE SODIUM 40 MG PO TBEC
40.0000 mg | DELAYED_RELEASE_TABLET | Freq: Every day | ORAL | Status: DC
  Administered 2012-08-07: 40 mg via ORAL
  Filled 2012-08-07: qty 1

## 2012-08-07 NOTE — Progress Notes (Signed)
PHARMACIST - PHYSICIAN COMMUNICATION  CONCERNING: Protonix IV to Oral Route Change Policy  RECOMMENDATION: This patient is receiving Protonix by the intravenous route.  Based on criteria approved by the Pharmacy and Therapeutics Committee, this is being converted to the equivalent oral dose form(s).   DESCRIPTION: These criteria include:  The patient is not neutropenic and does not exhibit a GI malabsorption state. Did not have GIB this admit.  The patient is eating (either orally or via tube) and/or has been taking other orally administered medications for a least 24 hours  If you have questions about this conversion, please contact the Pharmacy Department  Keshayla Schrum K. Allena Katz, PharmD, BCPS.  Clinical Pharmacist Pager [phone]. 08/07/2012 9:02 AM

## 2012-08-07 NOTE — Progress Notes (Signed)
PARENTERAL NUTRITION CONSULT NOTE - Follow-up  Pharmacy Consult for TPN Indication: Prolonged ileus  Allergies  Allergen Reactions  . Penicillins Anaphylaxis    Patient Measurements: Height: 5' (152.4 cm) Weight: 152 lb 1.9 oz (69 kg) IBW/kg (Calculated) : 45.5  Adjusted Body Weight: 55 Usual Weight: 69 kg  Vital Signs: Temp: 98.6 F (37 C) (09/05 0625) Temp src: Oral (09/05 0625) BP: 120/53 mmHg (09/05 0625) Pulse Rate: 82  (09/05 0625) Intake/Output from previous day: 09/04 0701 - 09/05 0700 In: 4331 [P.O.:600; I.V.:548; TPN:3183] Out: -  Intake/Output from this shift:    Labs:  Basename 08/05/12 0532 08/04/12 1004  WBC 5.3 7.2  HGB 10.3* 11.2*  HCT 33.1* 36.5  PLT 330 428*  APTT -- --  INR -- --     Basename 08/07/12 0500 08/06/12 0500 08/05/12 0532 08/04/12 1004  NA 136 138 137 --  K 3.9 3.9 3.5 --  CL 103 105 106 --  CO2 23 21 21  --  GLUCOSE 126* 140* 136* --  BUN 14 12 9  --  CREATININE 0.55 0.55 0.63 --  LABCREA -- -- -- --  CREAT24HRUR -- -- -- --  CALCIUM 9.3 9.3 9.1 --  MG 2.2 2.1 2.0 --  PHOS 3.6 3.8 3.9 --  PROT 6.4 -- 6.1 7.1  ALBUMIN 3.2* -- 3.0* 3.6  AST 15 -- 18 25  ALT 25 -- 29 35  ALKPHOS 73 -- 67 76  BILITOT 0.1* -- 0.1* 0.3  BILIDIR -- -- -- --  IBILI -- -- -- --  PREALBUMIN -- -- 24.2 --  TRIG -- -- 213* --  CHOLHDL -- -- -- --  CHOL -- -- 128 --   Estimated Creatinine Clearance: 57.5 ml/min (by C-G formula based on Cr of 0.55).    Basename 08/07/12 0731 08/07/12 0416 08/06/12 2355  GLUCAP 151* 162* 187*    Insulin Requirements in the past 24 hours:  6 units SSI and no insulin in TPN  CurrentNutrition:  Full liquid diet Clinimix E 5/15 at 65 mL/hr provides 78gm protein/d and 1321 avg daily Kcal from TPN  Nutritional Goals:  1600-1800 kCal, 70-80 grams of protein per day per RD assessment  Assessment: Mass found in colon at cecum 8/13 = adenocarcinoma with laparoscopic assisted partial colectomy 8/26. Patient  with post-op N/V/D not tolerating diet. Cdiff negative. No substantial nutritional intake in 7 days. Could be prolonged ileus vs pSBO. Reglan d/c'd. Patient remains on IV PPI.  GI: Patient doing well with POs, without nausea, noted plans to advance diet and d/c TPN after current bag is out. Noted multiple BM. 9/3 abd X-ray shows persistently dilated small bowel loops, postoperative ileus or bowel obstruction. Endo: CBG controlled- minimal SSI usage, no hx DM. TPN does not contain insulin. LytesJudieth Keens are WNL- would target K ~4 with ileus. Ca corrects to 10.1. Renal: Scr WNL, UOP not accurately documented, looks adequate Pulm: RA Cards: VSS Hepatobil: LFTs WNL, prealb 24.2. TG elevated at 213 Neuro: GCS 15, pain 0. ID:  Afeb, WBC WNL, no abx on board  Heme: H/H low stable, plts ok Best Practices: LMWH, IV PPI  Plan:  - d/c SSI and pending TPN labs - Please reconsult [place] if needed.  Thanks, Kees Idrovo K. Allena Katz, PharmD, BCPS.  Clinical Pharmacist Pager [phone]. 08/07/2012 9:06 AM

## 2012-08-07 NOTE — Progress Notes (Signed)
10 Days Post-Op  Subjective: No pain.  Moving bowels.  No nausea  Objective: Vital signs in last 24 hours: Temp:  [98.3 F (36.8 C)-98.6 F (37 C)] 98.6 F (37 C) (09/05 0625) Pulse Rate:  [81-85] 82  (09/05 0625) Resp:  [18-20] 20  (09/05 0625) BP: (119-125)/(53-63) 120/53 mmHg (09/05 0625) SpO2:  [97 %-99 %] 97 % (09/05 0625) Last BM Date: 08/06/12  Intake/Output from previous day: 09/04 0701 - 09/05 0700 In: 4331 [P.O.:600; I.V.:548; TPN:3183] Out: -  Intake/Output this shift:    Incision/Wound:soft NT  Incision C/D/I  ND  Lab Results:   Basename 08/05/12 0532 08/04/12 1004  WBC 5.3 7.2  HGB 10.3* 11.2*  HCT 33.1* 36.5  PLT 330 428*   BMET  Basename 08/07/12 0500 08/06/12 0500  NA 136 138  K 3.9 3.9  CL 103 105  CO2 23 21  GLUCOSE 126* 140*  BUN 14 12  CREATININE 0.55 0.55  CALCIUM 9.3 9.3   PT/INR No results found for this basename: LABPROT:2,INR:2 in the last 72 hours ABG No results found for this basename: PHART:2,PCO2:2,PO2:2,HCO3:2 in the last 72 hours  Studies/Results: Dg Abd 1 View  08/05/2012  *RADIOLOGY REPORT*  Clinical Data: Ileus.  Upper abdominal pain.  Colon resection surgery 8 days ago.  ABDOMEN - 1 VIEW  Comparison: CT abdomen pelvis 08/04/2012  Findings: There are multiple dilated small bowel loops, measuring up to 3.6 cm in caliber.  Anastomotic suture is seen in the right lower quadrant.  Small amount of gas is seen in the rectum. Vertical line of staples projects over the mid abdomen.  IMPRESSION: Persistently dilated small bowel loops.  Findings could reflect postoperative ileus or bowel obstruction.   Original Report Authenticated By: Britta Mccreedy, M.D.     Anti-infectives: Anti-infectives     Start     Dose/Rate Route Frequency Ordered Stop   07/27/12 1315   metroNIDAZOLE (FLAGYL) IVPB 500 mg     Comments: For OR      500 mg 100 mL/hr over 60 Minutes Intravenous  Once 07/27/12 1306 07/28/12 1054   07/27/12 1306   ciprofloxacin  (CIPRO) IVPB 400 mg        400 mg 200 mL/hr over 60 Minutes Intravenous 120 min pre-op 07/27/12 1306 07/28/12 1054          Assessment/Plan: s/p Procedure(s) (LRB) with comments: LAPAROSCOPIC PARTIAL COLECTOMY (N/A) - laparoscopic assisted partial colectomy Ileus seems better PC malnutrition   Stop TNA after today's  bag. Advance diet  LOS: 10 days    Linda Grant A. 08/07/2012

## 2012-08-08 ENCOUNTER — Telehealth: Payer: Self-pay | Admitting: *Deleted

## 2012-08-08 LAB — GLUCOSE, CAPILLARY: Glucose-Capillary: 91 mg/dL (ref 70–99)

## 2012-08-08 MED ORDER — ONDANSETRON HCL 4 MG PO TABS: 4.0000 mg | ORAL_TABLET | Freq: Four times a day (QID) | ORAL | Status: AC | PRN

## 2012-08-08 NOTE — Discharge Summary (Signed)
Physician Discharge Summary  Patient ID: Linda Grant MRN: [id number] DOB/AGE: Jul 16, 1941 71 y.o.  Admit date: 07/28/2012 Discharge date: 08/08/2012  Admission Diagnoses:  COLON CANCER STAGE 2 Patient Active Problem List  Diagnosis  . Hemorrhage of rectum and anus  . Family history of malignant neoplasm of gastrointestinal tract  . Diabetes mellitus  . Colon cancer    Discharge Diagnoses: SAME Active Problems:  * No active hospital problems. *    Discharged Condition: good  Hospital Course: Pt had an ileus post op that required TNA and resolved by POD 11.  Tolerating full liquid diet.  Wounds clean dry intact.  Having BM. Nausea resolved.  Consults: None  Significant Diagnostic Studies: CT X2    SHOWED ILEUS  Treatments: surgery: LAP RIGHT HEMICOLECTOMY  Discharge Exam: Blood pressure 128/62, pulse 79, temperature 98.4 F (36.9 C), temperature source Oral, resp. rate 18, height 5' (1.524 m), weight 152 lb 1.9 oz (69 kg), SpO2 100.00%. Incision/Wound:clean dry intact.  Soft NT  ND abdomen  Disposition: Final discharge disposition not confirmed  Discharge Orders    Future Appointments: Provider: Department: Dept Phone: Center:   08/25/2012 1:30 PM [facility] [phone] None   08/25/2012 2:00 PM Ladene Artist, MD [facility] [phone] None     Future Orders Please Complete By Expires   Increase activity slowly      Driving Restrictions      Comments:   DRIVE MONDAY   Lifting restrictions      Comments:   15 LBS OR LESS FOR 2 WEEKS   Discharge instructions      Comments:   FULL LIQUID DIET.  BOOST OR ENSURE SUPPLEMENT 2 TIMES A DAY.  ADVANCE DIET AS TOLERATED     Medication List  As of 08/08/2012  9:02 AM   STOP taking these medications         ferrous sulfate 325 (65 FE) MG tablet      folic acid 1 MG tablet      Vitamin D 2000 UNITS Caps         TAKE these medications         aspirin 81 MG tablet   Take 81 mg by mouth daily.      lisinopril 20 MG tablet   Commonly known as: PRINIVIL,ZESTRIL   Take 20 mg by mouth daily.      lovastatin 40 MG tablet   Commonly known as: MEVACOR   Take 40 mg by mouth at bedtime.      omeprazole 20 MG capsule   Commonly known as: PRILOSEC   Take 20 mg by mouth daily.      ondansetron 4 MG tablet   Commonly known as: ZOFRAN   Take 1 tablet (4 mg total) by mouth every 6 (six) hours as needed for nausea.             Signed: Mattilyn Crites A. 08/08/2012, 9:02 AM

## 2012-08-08 NOTE — Telephone Encounter (Signed)
Spoke with patient by phone.  Explained role of navigator.  Gave appointment date and time for Dr. Truett Perna on 08/25/12.  This RN also gave contact name and number and encouraged patient to call for any questions or assistance.  Patient verbalized comprehension.  Will continue to follow.

## 2012-08-08 NOTE — Progress Notes (Signed)
Discharge home.

## 2012-08-19 ENCOUNTER — Ambulatory Visit (INDEPENDENT_AMBULATORY_CARE_PROVIDER_SITE_OTHER): Payer: Medicare Other | Admitting: Surgery

## 2012-08-19 ENCOUNTER — Encounter (INDEPENDENT_AMBULATORY_CARE_PROVIDER_SITE_OTHER): Payer: Self-pay | Admitting: Surgery

## 2012-08-19 VITALS — BP 122/76 | HR 84 | Temp 98.2°F | Resp 16 | Ht 60.0 in | Wt 143.6 lb

## 2012-08-19 DIAGNOSIS — Z9889 Other specified postprocedural states: Secondary | ICD-10-CM

## 2012-08-19 NOTE — Progress Notes (Signed)
Patient returns after right hemicolectomy for stage II adenocarcinoma. Of note she had a sister in her 48s her right hemicolectomy. She had a postoperative ileus I kept her in the hospital but overall she looks great as to allow better. Bowels are moving. She is experiencing with her diet and overall doing well.  Exam: Abdomen soft nontender. Nondistended. Incision is clean dry and intact. Bowel sounds normal.  Impression: T2 N0 MX adenocarcinoma right colon status post laparoscopic-assisted right hemicolectomy  Plan: She is to see Dr Truett Perna  next week. Return in 3 months.

## 2012-08-19 NOTE — Patient Instructions (Signed)
Return 3 months

## 2012-08-25 ENCOUNTER — Other Ambulatory Visit: Payer: Self-pay | Admitting: *Deleted

## 2012-08-25 ENCOUNTER — Encounter: Payer: Self-pay | Admitting: Oncology

## 2012-08-25 ENCOUNTER — Telehealth: Payer: Self-pay | Admitting: Oncology

## 2012-08-25 ENCOUNTER — Ambulatory Visit: Payer: Medicare Other

## 2012-08-25 ENCOUNTER — Ambulatory Visit (HOSPITAL_BASED_OUTPATIENT_CLINIC_OR_DEPARTMENT_OTHER): Payer: Medicare Other | Admitting: Oncology

## 2012-08-25 VITALS — BP 122/75 | HR 110 | Temp 97.6°F | Resp 20 | Ht 60.5 in | Wt 144.8 lb

## 2012-08-25 DIAGNOSIS — Z800 Family history of malignant neoplasm of digestive organs: Secondary | ICD-10-CM

## 2012-08-25 DIAGNOSIS — Z1509 Genetic susceptibility to other malignant neoplasm: Secondary | ICD-10-CM

## 2012-08-25 DIAGNOSIS — C189 Malignant neoplasm of colon, unspecified: Secondary | ICD-10-CM

## 2012-08-25 DIAGNOSIS — C180 Malignant neoplasm of cecum: Secondary | ICD-10-CM

## 2012-08-25 NOTE — Progress Notes (Signed)
Checked in new pt with no financial concerns. °

## 2012-08-25 NOTE — Progress Notes (Signed)
[facility] New Patient Consult   Referring MD: Linda Grant   Linda Grant 71 y.o.  08-20-1941    Reason for Referral: Colon cancer     HPI: She had an episode of rectal bleeding and saw Dr. Kevan Grant. She reports the stool was Hemoccult positive. She was referred to Dr. Juanda Grant and underwent a colonoscopy on 07/02/2012. A mass was found in the cecum. A sessile polyp was found in the cecum. Moderate diverticulosis was noted in the sigmoid colon. The biopsy from the cecal mass confirmed adenocarcinoma. The right colon polyp was a tubular adenoma without high-grade dysplasia or malignancy.  A CT the abdomen and pelvis on 07/03/2012 found the lung bases to be clear. Mild concentric wall thickening was noted at the cecum with mild pericolonic stranding/inflammatory changes. A 9 mm short axis ileocolic node. No evidence of distant metastatic disease in the abdomen or pelvis.  She was referred to Dr. Luisa Grant and was taken to a laparoscopic partial colectomy procedure on 07/28/2012. Exploration revealed a normal omentum,:, Small bowel, peritoneum, liver, and stomach. The pathology ([id number]) confirmed an invasive well-differentiated adenocarcinoma measuring 4 cm. The tumor was located 1.5 cm distal to the ileocecal valve. Carcinoma extended through the muscular propria into the pericolonic soft tissue. The surgical resection margins were negative. No evidence of carcinoma in 14 lymph nodes. Lymphovascular and perineural invasion were not identified. Additional polyps were not identified. A mild to moderate Crohn's like lymphoid response was noted. Macroscopic tumor perforation was not identified. The tumor was sent for microsatellite instability testing and returned MSI-high. Immunohistochemical stains found loss of expression of PMS2.  Surgery was complicated by a postoperative ileus that required a prolonged hospitalization. She has now recovered from surgery.  Past Medical  History  Diagnosis Date  . Elevated cholesterol   . Hypertension   . GERD (gastroesophageal reflux disease)   . Vitamin d deficiency   . Hyperplastic colon polyp   . Diverticulosis   . Hx of adenomatous colonic polyps 07/02/12  . Anemia   . 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 Diabetes mellitus     type 2 - controlled  . Mitral valve prolapse   .  G0 P0    .    . Heart murmur   . Arthritis     Past Surgical History  Procedure Date  . Vaginal hysterectomy   . Mouth surgery     dental implants  . Dental surgery 1995    Dental Implants  . Partial colectomy 07/28/2012    Family History  Problem Relation Age of Onset  . Colon cancer/pancreas cancer  Sister 37/40   .         Marland Kitchen Diabetes Brother     deceased  . Diabetes Brother 51  . Heart disease Brother   . Kidney disease Brother     deceased  . Heart disease Father   . Cancer Sister  58     lung   No other family history of cancer Current outpatient prescriptions:aspirin 81 MG tablet, Take 81 mg by mouth daily., Disp: , Rfl: ;  Cholecalciferol (VITAMIN D3) 2000 UNITS TABS, Take 2,000 Units by mouth daily., Disp: , Rfl: ;  Glucosamine-Chondroit-Vit C-Mn (GLUCOSAMINE CHONDR 1500 COMPLX PO), Take 1 tablet by mouth 2 (two) times daily. 1500/1200, Disp: , Rfl: ;  lisinopril (PRINIVIL,ZESTRIL) 20 MG tablet, Take 20 mg by mouth daily., Disp: , Rfl:  lovastatin (MEVACOR) 40 MG tablet, Take 40 mg by mouth at  bedtime., Disp: , Rfl: ;  omeprazole (PRILOSEC) 20 MG capsule, Take 20 mg by mouth daily., Disp: , Rfl:   Allergies:  Allergies  Allergen Reactions  . Penicillins Anaphylaxis    Social History: She works an Programme researcher, broadcasting/film/video. She lives with her husband and Linda Grant. She does not use tobacco. She drinks approximately one glass of wine per day. No risk factor for HIV or hepatitis. She has been a blood donor.    ROS:   Positives include: Dark stool for several months prior to surgery, anorexia since surgery  A complete  ROS was otherwise negative.  Physical Exam:  Blood pressure 122/75, pulse 110, temperature 97.6 F (36.4 C), temperature source Oral, resp. rate 20, height 5' 0.5" (1.537 m), weight 144 lb 12.8 oz (65.681 kg).  HEENT: Oropharynx without visible mass, neck without mass Lungs: Clear bilaterally Cardiac: Regular rate and rhythm Abdomen: Healed surgical incisions, no hepatomegaly, nontender, no mass  Vascular: No leg edema Lymph nodes: No cervical, supraclavicular, axillary, or inguinal nodes Neurologic: Alert and oriented, the motor exam appears intact in the upper and lower Jevity's Skin: No rash   LAB:  CBC  07/23/2012-hemoglobin 10.8, MCV 87.2  CMP      Component Value Date/Time   NA 136 08/07/2012 0500   K 3.9 08/07/2012 0500   CL 103 08/07/2012 0500   CO2 23 08/07/2012 0500   GLUCOSE 126* 08/07/2012 0500   BUN 14 08/07/2012 0500   CREATININE 0.55 08/07/2012 0500   CALCIUM 9.3 08/07/2012 0500   PROT 6.4 08/07/2012 0500   ALBUMIN 3.2* 08/07/2012 0500   AST 15 08/07/2012 0500   ALT 25 08/07/2012 0500   ALKPHOS 73 08/07/2012 0500   BILITOT 0.1* 08/07/2012 0500   GFRNONAA >90 08/07/2012 0500   GFRAA >90 08/07/2012 0500   CEA on 07/02/2012-1.0  Radiology: As per history of present illness, chest x-ray 07/23/2012-no acute cardiopulmonary abnormality   Assessment/Plan:   1. 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 Preoperative anemia, July 2013  4. Family history of colon/pancreas and lung cancer   Disposition:   She has been diagnosed with stage II colon cancer. I discussed the diagnosis and prognosis with the patient and her husband. We reviewed the surgical pathology report in detail. She has an excellent prognosis for a long-term disease-free survival. I do not recommend adjuvant  chemotherapy in her case. The tumor does not have "high-risk "features which predict for a higher relapse rate compared to the average stage II patient.  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 This is based on her family history, the right colon tumor histology, and the high microsatellite instability/loss of PMS2 expression.   I explained the improved prognosis associated with colon cancer in patients with HNPCC. We discussed the inheritance pattern of this genetic syndrome. She will be referred to the genetics counselor for confirmatory gene testing.  Ms. Meroney will return for an office visit and a CEA in 6 months. She will followup with Dr. Juanda Grant for a surveillance colonoscopy in one year. Future colonoscopy and other surveillance measures will be recommended based on the final genetics testing.  Oriah Leinweber 08/25/2012, 6:09 PM

## 2012-08-25 NOTE — Telephone Encounter (Signed)
gv and printed appt for pt....Marland Kitchensed

## 2012-08-25 NOTE — Progress Notes (Signed)
Met with patient and spouse.  Introduced self and explained Heritage manager role.  Information given to patient on colon cancer.  Referral made to genetics counseling due to path results and family history of colon caner,  Patient declined referral to SW or dietician at this time.  Patient verbalized comprehension of instructions for repeat colonoscopy in year.  Request given to Tobie Poet in pathology for [phone].   Additional testing requested per Dr. Truett Perna for additional BRAF mutational analysis to confirm MLH1 promotor methylation.

## 2012-08-26 ENCOUNTER — Telehealth: Payer: Self-pay | Admitting: Oncology

## 2012-08-26 ENCOUNTER — Inpatient Hospital Stay (HOSPITAL_COMMUNITY): Admission: RE | Admit: 2012-08-26 | Payer: Self-pay | Source: Ambulatory Visit

## 2012-08-26 ENCOUNTER — Encounter (INDEPENDENT_AMBULATORY_CARE_PROVIDER_SITE_OTHER): Payer: Self-pay

## 2012-08-26 ENCOUNTER — Other Ambulatory Visit (HOSPITAL_COMMUNITY)
Admission: RE | Admit: 2012-08-26 | Discharge: 2012-08-26 | Disposition: A | Discharge status: Home / Self Care | Payer: Medicare Other | Source: Ambulatory Visit | Attending: Oncology | Admitting: Oncology

## 2012-08-26 DIAGNOSIS — C801 Malignant (primary) neoplasm, unspecified: Secondary | ICD-10-CM | POA: Insufficient documentation

## 2012-08-26 NOTE — Telephone Encounter (Signed)
called pt and lmfor her that thereis no cx list for genetics and that i put a note to call her if we see any cx or she may call  to see if any cx appts  aom

## 2012-09-04 ENCOUNTER — Encounter: Payer: Self-pay | Admitting: Dietician

## 2012-09-04 NOTE — Progress Notes (Signed)
Brief Out-patient Oncology Nutrition Note  Reason: Patient Screened Positive For Nutrition Risk For Unintentional Weight Loss and Decreased Appetite.   Linda Grant is a 71 year old female patient of Dr. Truett Perna, diagnosed with colon cancer. Contacted patient via telephone for positive nutrition risk. She reported she is eating well. She reported she eats a little smaller portions than normal and since her surgery 5 weeks ago she has not yet been eating raw vegetables and high fiber foods. She asked when she would be able to incorporate raw fruits and vegetables back into her diet and I encouraged her to ask her surgeon. She reported she is also consuming 1 to 2 high protein Ensure nutrition supplements daily.   Wt Readings from Last 10 Encounters:  08/25/12 144 lb 12.8 oz (65.681 kg)  08/19/12 143 lb 9.6 oz (65.137 kg)  07/30/12 152 lb 1.9 oz (69 kg)  07/30/12 152 lb 1.9 oz (69 kg)  07/23/12 152 lb 8 oz (69.174 kg)  07/10/12 153 lb 2 oz (69.457 kg)  07/04/12 152 lb 6.4 oz (69.128 kg)  07/02/12 153 lb (69.4 kg)  06/30/12 153 lb 6.4 oz (69.582 kg)    I have encouraged the patient to eat 4 to 6 small meals daily. We discussed strategies to increase caloric intake. The patient was without any further nutrition related questions. Provided RD contact information and instructed patient to contact RD for future questions.   RD available for nutrition needs.   Iven Finn, MS, RD, LDN [phone]

## 2012-09-10 ENCOUNTER — Telehealth (INDEPENDENT_AMBULATORY_CARE_PROVIDER_SITE_OTHER): Payer: Self-pay

## 2012-09-10 NOTE — Telephone Encounter (Signed)
Patient called wanting to know if she could start to work out. She is 6 weeks out from surgery. I told her yes but she needs to start out slow to let her body get use to that kind of activity; especially abdominal workout. Patient agreed and understood.

## 2012-09-15 ENCOUNTER — Telehealth: Payer: Self-pay | Admitting: *Deleted

## 2012-09-16 ENCOUNTER — Telehealth: Payer: Self-pay | Admitting: Genetic Counselor

## 2012-09-16 NOTE — Telephone Encounter (Signed)
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  We discussed that we will perform genetic testing the day that she comes in to determine if we can find the mutation that is causing Lynch in her family.  If we find a mutation we can offer testing to other family members.

## 2012-09-16 NOTE — Telephone Encounter (Signed)
Left message on cell phone and at home with husband that I was trying to contact her to answer questions prior to her appointment with [place] in November.  Left my phone number.

## 2012-10-06 ENCOUNTER — Encounter: Payer: Self-pay | Admitting: Genetic Counselor

## 2012-10-06 ENCOUNTER — Ambulatory Visit: Payer: Medicare Other | Admitting: Lab

## 2012-10-06 ENCOUNTER — Ambulatory Visit (HOSPITAL_BASED_OUTPATIENT_CLINIC_OR_DEPARTMENT_OTHER): Payer: Medicare Other | Admitting: Genetic Counselor

## 2012-10-06 DIAGNOSIS — C189 Malignant neoplasm of colon, unspecified: Secondary | ICD-10-CM

## 2012-10-06 DIAGNOSIS — Z1509 Genetic susceptibility to other malignant neoplasm: Secondary | ICD-10-CM

## 2012-10-06 DIAGNOSIS — IMO0002 Reserved for concepts with insufficient information to code with codable children: Secondary | ICD-10-CM

## 2012-10-06 DIAGNOSIS — Z800 Family history of malignant neoplasm of digestive organs: Secondary | ICD-10-CM

## 2012-10-06 DIAGNOSIS — C180 Malignant neoplasm of cecum: Secondary | ICD-10-CM

## 2012-10-06 NOTE — Progress Notes (Signed)
Dr.  Truett Perna requested a consultation for genetic counseling and risk assessment for Linda Grant, a 71 y.o. 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 She presents to clinic today to discuss the possibility of a genetic predisposition to cancer, and to further clarify her risks, as well as her family members' risks for cancer.   HISTORY OF PRESENT ILLNESS: In 2013, at the age of 70, Linda Grant was diagnosed with colon cancer.  Tumor testing found that she had microsatteline instability-high and loss of PMS2 expression on IHC.  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    Past Medical History  Diagnosis Date  . Elevated cholesterol   . Hypertension   . GERD (gastroesophageal reflux disease)   . Vitamin D deficiency   . Hyperplastic colon polyp   . Diverticulosis   . Hx of adenomatous colonic polyps 07/02/12  . Anemia   . 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 Diabetes mellitus     type 2 - controlled  . Mitral valve prolapse   . Rectal bleeding   . Blood in stool   . Heart murmur   . Arthritis   . Colon cancer     Past Surgical History  Procedure Date  . Vaginal hysterectomy   . Mouth surgery     dental implants  . Dental surgery 1995    Dental Implants  . Partial colectomy 07/28/2012    History  Substance Use Topics  . Smoking status: Former Smoker    Quit date: 07/10/1982  . Smokeless tobacco: Never Used  . Alcohol Use: 1.8 oz/week    3 Glasses of wine per week     Comment: 1 to 2 per day    REPRODUCTIVE HISTORY AND PERSONAL RISK ASSESSMENT FACTORS: Menarche was at age 13.   Menopause in her late 3s Uterus Intact: No Ovaries Intact: Yes [id number] , first live birth at age N/A  She has not previously undergone treatment for infertility.   OCP use for 15-20 years   She has not used HRT in the  past.   MSI:  High IHC: PMS2 loss of expression  FAMILY HISTORY:  We obtained a detailed, 4-generation family history.  Significant diagnoses are listed below: Family History  Problem Relation Age of Onset  . Lung cancer Sister 22  . Diabetes Brother     deceased  . Diabetes Brother 3  . Heart disease Father   . Colon cancer Sister 58  . Pancreatic cancer Sister 103  . Breast cancer Maternal Aunt   . Melanoma Paternal Uncle   . Uterine cancer Cousin     dx in teens  . Breast cancer Cousin   . Prostate cancer Cousin     one maternal cousin and one paternal cousin with prostate cancer  . Bladder Cancer Cousin     dx in his 20s  the patient had colon cancer at age 88.  She has two sisters and one brother.  One sister had colon cancer at age 17-69 and pancreatic cancer at 56-41, her other sister had lung cancer at age 13 and died at 16.  A brother had COPD and died of aspiration pneumonia, and the remaining brother is alive and healthy.  The patients mother is 55.  She has nine brothers and sisters, one sister was diagnosed  with breast cancer over the age of 15.  The patient has a maternal cousin with breast cancer over the age of 55 and a cousin with prostate cancer.  The patient's paternal uncle had melanoma.  There were 7 other siblings, none of whom had cancer.  The patient's paternal cousin had bladder cancer in his 37s, another cousin had prostate cancer, and another cousin had a daughter with lymphoma.  There is no other reported cancer history on either side of the family.  Patient's maternal ancestors are of [place] descent, and paternal ancestors are of [place] and [place] descent. There is no reported Ashkenazi Jewish ancestry. There is no known consanguinity.  GENETIC COUNSELING RISK ASSESSMENT, DISCUSSION, AND SUGGESTED FOLLOW UP: We reviewed the natural history and genetic etiology of sporadic, familial and hereditary cancer syndromes.  About 10% of colon cancer is  hereditary.  Almost 3% of colon cancer is the result of Lynch syndrome.  We reviewed the cancers associated with Lynch syndrome, including colon, endometrial, ovarian, gastric, bladder, bile duct, pancreatic and brain cancer.  Based on her tumor testing, it appears that she has a PMS2 mutation.  Individuals with a PMS2 mutation have an increased risk for colon and endometrial cancer, but the remaining cancers are less commonly seen.  We reviewed the dominant inheritance pattern of Lynch syndrome and who in the family should be tested if a mutation is found in the patient.  The patient's personal history of colon cancer  is suggestive of the following possible diagnosis: Lynch syndrome  We discussed that identification of a hereditary cancer syndrome may help her care providers tailor the patients medical management. If a mutation indicating Lynch syndrome is detected in this case, the [employer] recommendations would include increased cancer surveillance. If a mutation is detected, the patient will be referred back to the referring provider and to any additional appropriate care providers to discuss the relevant options.   If a mutation is not found in the patient, we would still follow her as if she had Lynch syndrome. Cancer surveillance options would be discussed for the patient according to the appropriate standard National Comprehensive Cancer Network and American Cancer Society guidelines, with consideration of their personal and family history risk factors. In this case, the patient will be referred back to their care providers for discussions of management.   After considering the risks, benefits, and limitations, the patient provided informed consent for  the following  testing: PMS2 gene testing, reflexing to MLH1 through [facility].   Per the patient's request, we will contact her by telephone to discuss these results. A follow up genetic counseling visit will be  scheduled if indicated.  The patient was seen for a total of 60 minutes, greater than 50% of which was spent face-to-face counseling.  This plan is being carried out per Dr. Kalman Drape recommendations.  This note will also be sent to the referring provider via the electronic medical record. The patient will be supplied with a summary of this genetic counseling discussion as well as educational information on the discussed hereditary cancer syndromes following the conclusion of their visit.   Patient was discussed with Dr. Drue Second.   _______________________________________________________________________ For Office Staff:  Number of people involved in session: 3 Was an Intern/ student involved with case: yes

## 2012-10-15 ENCOUNTER — Telehealth (INDEPENDENT_AMBULATORY_CARE_PROVIDER_SITE_OTHER): Payer: Self-pay

## 2012-10-15 NOTE — Telephone Encounter (Signed)
I called patient back per her request. She states she called in on Monday and spoke with a triage nurse complaining of abdominal pain where incision is after a more vigorous workout than what she usually does. She was worried she may have injured herself and questioned if she should be working out. She was told by the nurse she spoke with that she would address this with me and Dr. Luisa Hart and I would call her back. I explained I never got a message she called in Monday and apologized that no one got back to her or addressed her concerns. Patient then told me she slowed down her workout routine since Monday and is feeling a lot better. She is now only a little tender in the area. I told her it was okay to work out and it sounded like she just pushed herself a little more than she should have. I told her that her body will tell her when she has done to much and when to back off physical activity. She agreed to lighten up her workouts and to call if pain worsens or fails to improve. She then asked when she could start her iron supplement back; states Dr. Luisa Hart told her to stop it. I told her I would get message to him and call her back.

## 2012-10-15 NOTE — Telephone Encounter (Signed)
Message copied by Brennan Bailey on Wed Oct 15, 2012  2:30 PM ------      Message from: Marin Shutter      Created: Wed Oct 15, 2012  9:17 AM      Regarding: Dr. Luisa Hart      Contact: [phone]       Pt would like you to call her back. Thx

## 2012-10-16 ENCOUNTER — Telehealth (INDEPENDENT_AMBULATORY_CARE_PROVIDER_SITE_OTHER): Payer: Self-pay

## 2012-10-16 NOTE — Telephone Encounter (Signed)
I was told about a patient of Dr Donell Beers with this problem but given no name and was not ask to speak to patient or call patient.  If this behavior continues from your coworkers,  I will behave like Dr Magnus Ivan and tell people to get out of my POD. You should not be in the middle of this and this happens far too often. She can take iron if she wants.

## 2012-10-16 NOTE — Telephone Encounter (Signed)
Called patient back to let her know she can start taking her iron supplement. She was not home but her husband said he would give her the message and have her call me back if she had any questions.

## 2012-10-17 ENCOUNTER — Telehealth (INDEPENDENT_AMBULATORY_CARE_PROVIDER_SITE_OTHER): Payer: Self-pay

## 2012-10-17 NOTE — Telephone Encounter (Signed)
Returned patients call regarding iron supplement. She made sure it was ok to start and I told her yes. I asked how pain at incision site was doing. She states tenderness is still there but getting better. She will watch over next week or 2 and call to make an appoitnemnt if it does not go away or gets worse.

## 2012-11-07 ENCOUNTER — Telehealth: Payer: Self-pay | Admitting: Genetic Counselor

## 2012-11-07 NOTE — Telephone Encounter (Signed)
Revealed negative PMS2 and MLH1 testing.  Discussed implications of this finding, along with the BRAF mutation found.  Suggested considering methylation, vs. Testing her sister for colon cancer genes.  She will talk with her sister to see if she could come in.

## 2012-11-18 ENCOUNTER — Encounter (INDEPENDENT_AMBULATORY_CARE_PROVIDER_SITE_OTHER): Payer: Self-pay

## 2012-11-19 ENCOUNTER — Telehealth: Payer: Self-pay | Admitting: Genetic Counselor

## 2012-11-19 NOTE — Telephone Encounter (Signed)
Called patient to discuss findings from [facility] lab.  Dr. Frederica Kuster called [facility] and requested an re-evaluation of the tumor result on this patient.  They indicated that they now feel that both MLH1 and PMS2 had loss of expression, therefore indicating that this is a sporadic tumor.  The patient also has a BRAF mutation, which also points in the direction of a sporadic tumor.  [facility] will reissue the report and amend the original finding.  Discussed with the patient that we do not feel that she has Lynch syndrome based on this new result.  However, her sister still needs to consider testing as she had colon cancer at 79 and pancreatic cancer at 45.  She will talk with her sister and see what she decides to do.

## 2012-12-08 ENCOUNTER — Encounter (INDEPENDENT_AMBULATORY_CARE_PROVIDER_SITE_OTHER): Payer: Self-pay | Admitting: Surgery

## 2012-12-08 ENCOUNTER — Ambulatory Visit (INDEPENDENT_AMBULATORY_CARE_PROVIDER_SITE_OTHER): Payer: Medicare PPO | Admitting: Surgery

## 2012-12-08 VITALS — BP 132/78 | HR 91 | Temp 97.9°F | Resp 18 | Ht 60.0 in | Wt 146.0 lb

## 2012-12-08 DIAGNOSIS — Z85038 Personal history of other malignant neoplasm of large intestine: Secondary | ICD-10-CM

## 2012-12-08 NOTE — Patient Instructions (Addendum)
Return 6 months

## 2012-12-08 NOTE — Progress Notes (Signed)
Subjective:     Patient ID: Linda Grant, female   DOB: November 03, 1941, 72 y.o.   MRN: [id number]  HPI   Linda Grant     Returns to clinic after laparoscopic right hemicolectomy in August of 2013. She has been tested for Lynch syndrome insofar as negative. She has some soreness along her incision especially with exercise.      Review of Systems  HENT: Negative.   Cardiovascular: Negative.   Gastrointestinal: Negative.        Objective:   Physical Exam  Constitutional: She appears well-developed.  Eyes: EOM are normal. Pupils are equal, round, and reactive to light.  Abdominal: There is no tenderness.    Skin: Skin is warm and dry.  Psychiatric: She has a normal mood and affect. Her behavior is normal. Judgment and thought content normal.       Assessment:     Stage II colon cancer stable history of    Plan:     Return 6 months.start exercise program slowly.

## 2013-01-05 ENCOUNTER — Other Ambulatory Visit: Payer: Self-pay | Admitting: Family Medicine

## 2013-01-05 DIAGNOSIS — Z1231 Encounter for screening mammogram for malignant neoplasm of breast: Secondary | ICD-10-CM

## 2013-01-17 ENCOUNTER — Other Ambulatory Visit: Payer: Self-pay

## 2013-01-27 ENCOUNTER — Ambulatory Visit
Admission: RE | Admit: 2013-01-27 | Discharge: 2013-01-27 | Disposition: A | Discharge status: Home / Self Care | Payer: Medicare PPO | Source: Ambulatory Visit | Attending: Family Medicine | Admitting: Family Medicine

## 2013-01-27 DIAGNOSIS — Z1231 Encounter for screening mammogram for malignant neoplasm of breast: Secondary | ICD-10-CM

## 2013-02-23 ENCOUNTER — Telehealth: Payer: Self-pay | Admitting: Oncology

## 2013-02-23 ENCOUNTER — Other Ambulatory Visit (HOSPITAL_BASED_OUTPATIENT_CLINIC_OR_DEPARTMENT_OTHER): Payer: Medicare PPO

## 2013-02-23 ENCOUNTER — Ambulatory Visit (HOSPITAL_BASED_OUTPATIENT_CLINIC_OR_DEPARTMENT_OTHER): Payer: Medicare PPO | Admitting: Oncology

## 2013-02-23 VITALS — BP 144/81 | HR 92 | Temp 96.9°F | Resp 18 | Ht 60.0 in | Wt 153.6 lb

## 2013-02-23 DIAGNOSIS — C180 Malignant neoplasm of cecum: Secondary | ICD-10-CM

## 2013-02-23 DIAGNOSIS — Z801 Family history of malignant neoplasm of trachea, bronchus and lung: Secondary | ICD-10-CM

## 2013-02-23 DIAGNOSIS — C189 Malignant neoplasm of colon, unspecified: Secondary | ICD-10-CM

## 2013-02-23 DIAGNOSIS — Z800 Family history of malignant neoplasm of digestive organs: Secondary | ICD-10-CM

## 2013-02-23 NOTE — Telephone Encounter (Signed)
gv and printed appt schedule for pt for Sept....sent pt back to lab

## 2013-02-23 NOTE — Progress Notes (Signed)
   [facility]    OFFICE PROGRESS NOTE   INTERVAL HISTORY:   She returns as scheduled. She feels well. No complaint. Additional genetic testing was negative for HNPCC. She reports her sister also underwent negative genetic testing.  Objective:  Vital signs in last 24 hours:  Blood pressure 144/81, pulse 92, temperature 96.9 F (36.1 C), temperature source Oral, resp. rate 18, height 5' (1.524 m), weight 153 lb 9.6 oz (69.673 kg).    HEENT: neck withoutmass Lymphatics: and cervical, supra-clavicular, axillary, or inguinal nodes Resp: lungs clear bilaterally Cardio: regular rate and rhythm GI: no hepatosplenomegaly, no mass, nontender Vascular: no leg edema  Lab Results: CEA pending   Medications: I have reviewed the patient's current medications.  Assessment/Plan: 1. 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 Preoperative anemia, July 2013  4. Family history of colon/pancreas and lung cancer   Disposition:  She remains in clinical remission from colon cancer. We will followup on the CEA from today. Ms. Hitch will return for an office visit and CEA in 6 months. She will schedule a surveillance colonoscopy with Dr. Juanda Chance for July of 2014.  Final genetic testing did not confirm a diagnosis of hereditary non-polyposis colon cancer. She remains at increased risk of developing a new colon cancer based on her personal and family history. She plans to continue colonoscopy followup with Dr. Juanda Chance.   Thornton Papas, MD  02/23/2013  8:42 PM

## 2013-02-24 ENCOUNTER — Telehealth: Payer: Self-pay | Admitting: *Deleted

## 2013-02-24 NOTE — Telephone Encounter (Signed)
Pt notified of CEA result.  Pt verbalized understanding.

## 2013-02-24 NOTE — Telephone Encounter (Signed)
Message copied by Gala Romney on Tue Feb 24, 2013  4:46 PM ------      Message from: Thornton Papas B      Created: Mon Feb 23, 2013  9:36 PM       Please call patient, cea is normal ------

## 2013-04-10 ENCOUNTER — Encounter: Payer: Self-pay | Admitting: Internal Medicine

## 2013-05-12 ENCOUNTER — Telehealth (INDEPENDENT_AMBULATORY_CARE_PROVIDER_SITE_OTHER): Payer: Self-pay

## 2013-05-12 NOTE — Telephone Encounter (Signed)
Patient called in stating she was lifting heavy objects and now has pain in her RLQ and wants Dr Luisa Hart to see her Friday. She is in [place] until Thursday and thinks she messed something up from her colon surgery. I explained it sounds like she may have strained or pulled a muscle since it happened after she was lifting heavy objects. She stressed she really wants Dr Luisa Hart to see her Friday. I explained he is already overbooked at this point on Friday but that I would send him a message to let him know what is going on. I will call her back if he is okay with seeing her or anything he suggests she do.

## 2013-05-12 NOTE — Telephone Encounter (Signed)
Will need CT before I can tell anything. She can come if she wants but will need scan prior to visit to get the most out of it.

## 2013-05-13 ENCOUNTER — Other Ambulatory Visit (INDEPENDENT_AMBULATORY_CARE_PROVIDER_SITE_OTHER): Payer: Self-pay

## 2013-05-13 DIAGNOSIS — R1031 Right lower quadrant pain: Secondary | ICD-10-CM

## 2013-05-13 NOTE — Telephone Encounter (Signed)
I called patient back and went over what Dr Luisa Hart suggested in his note below. She wants to proceed with the CT on Friday and be seen in our office following imaging. I set her up to be at [facility] on 6/13 @ 9:30. I gave her instructions to drink contrast @ 7:30 and 8:30 that morning with no solid food after 5:30. She will come over to our office after her scan. Patients husband will come pick up contrast for patient.

## 2013-05-15 ENCOUNTER — Ambulatory Visit
Admission: RE | Admit: 2013-05-15 | Discharge: 2013-05-15 | Disposition: A | Discharge status: Home / Self Care | Payer: Medicare PPO | Source: Ambulatory Visit | Attending: Surgery | Admitting: Surgery

## 2013-05-15 ENCOUNTER — Ambulatory Visit (INDEPENDENT_AMBULATORY_CARE_PROVIDER_SITE_OTHER): Payer: Medicare PPO | Admitting: Surgery

## 2013-05-15 ENCOUNTER — Encounter (INDEPENDENT_AMBULATORY_CARE_PROVIDER_SITE_OTHER): Payer: Self-pay | Admitting: Surgery

## 2013-05-15 VITALS — BP 134/80 | HR 88 | Temp 97.0°F | Ht 60.0 in | Wt 153.0 lb

## 2013-05-15 DIAGNOSIS — G8929 Other chronic pain: Secondary | ICD-10-CM

## 2013-05-15 DIAGNOSIS — R1031 Right lower quadrant pain: Secondary | ICD-10-CM

## 2013-05-15 MED ORDER — CIPROFLOXACIN HCL 500 MG PO TABS: 500.0000 mg | ORAL_TABLET | Freq: Two times a day (BID) | ORAL | Status: AC

## 2013-05-15 MED ORDER — METRONIDAZOLE 500 MG PO TABS: 500.0000 mg | ORAL_TABLET | Freq: Three times a day (TID) | ORAL | Status: AC

## 2013-05-15 MED ORDER — IOHEXOL 300 MG/ML  SOLN
100.0000 mL | Freq: Once | INTRAMUSCULAR | Status: AC | PRN
  Administered 2013-05-15: 100 mL via INTRAVENOUS

## 2013-05-15 MED ORDER — HYDROCODONE-ACETAMINOPHEN 10-325 MG PO TABS: 1.0000 | ORAL_TABLET | Freq: Four times a day (QID) | ORAL | Status: DC | PRN

## 2013-05-15 NOTE — Progress Notes (Signed)
Subjective:     Patient ID: Linda Grant, female   DOB: [birth year], 72 y.o.   MRN: [id number]  HPI Patient returns to clinic today do to right lower quadrant abdominal pain. 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 She had some pain last Saturday that she thought was from some excessive lifting. She felt better Sunday and traveled to [place] after that. The pain recurred and she flew back and obtain him to be seen. She thought was a muscle strain but wished to be checked out today. The pain is improved over the week. She still has discomfort when she rolls on her right side though. Pain is made worse with movement. No vomiting but some occasional nausea. She is moving her bowels no blood in her stool. She did eat  some ground beef last week and her symptoms started after that.  Review of Systems  Constitutional: Positive for activity change. Negative for fever and chills.  Gastrointestinal: Positive for nausea and abdominal pain. Negative for vomiting, diarrhea, constipation and blood in stool.  Musculoskeletal: Negative.        Objective:   Physical Exam  Constitutional: She appears well-developed.  Neck: Normal range of motion. Neck supple.  Cardiovascular: Normal rate and regular rhythm.   Abdominal: Soft. There is tenderness.     Clinical Data: Right lower quadrant abdominal pain, history of  colon cancer, prior hysterectomy, appendectomy, and colon  resection, history of renal stones  CT ABDOMEN AND PELVIS WITH CONTRAST  Technique: Multidetector CT imaging of the abdomen and pelvis was  performed following the standard protocol during bolus  administration of intravenous contrast.  BUN and creatinine were obtained on site at [facility] at  [address] Results: BUN 6 mg/dL, Creatinine 0.7 mg/dL.  Contrast: OMNIPAQUE IOHEXOL 300 MG/ML SOLN  Comparison: 08/04/2012  Findings: Lung bases are clear.  Stable 9  mm hypoenhancing lesion in the gallbladder fossa (series  2/image 28), likely benign.  Spleen, pancreas, and adrenal glands are within normal limits.  Gallbladder is unremarkable. Mild stranding along the gallbladder  fundus is likely secondary to surrounding inflammatory changes. No  intrahepatic or extrahepatic ductal dilatation.  Kidneys are within normal limits. No hydronephrosis.  Prior right hemicolectomy. Wall thickening/inflammatory changes  along the ileocolic anastomosis ( series 2/image 38) and involving  the distal ileum (series 2/image 43). Trace fluid/stranding  beneath the right mid abdominal wall (series 2/image 32).  No evidence of bowel obstruction. Mild colonic diverticulosis,  without associated inflammatory changes.  Mild atherosclerotic calcifications of the abdominal aorta and  branch vessels.  No abdominopelvic ascites.  No suspicious abdominopelvic lymphadenopathy.  Status post hysterectomy. Bilateral ovaries are unremarkable.  Bladder is within normal limits.  Small fat-containing midline ventral hernia (series 2/image 36).  Mild degenerative changes at L2-3. Grade 1 anterolisthesis of L4  on L5.  IMPRESSION:  Prior right hemicolectomy. No evidence of bowel obstruction.  Wall thickening/inflammatory changes at the ileocolic anastomosis  and involving the distal ileum, favored to reflect  infectious/inflammatory enterocolitis. Given the clinical history,  follow-up colonoscopy is suggested.  No evidence of metastatic disease in the abdomen/pelvis.  Original Report Authenticated By: Charline Bills, M.D.     Assessment:     Inflammation at previous anastomosis    Plan:     Place patient on Cipro 500 mg by mouth twice a day and Flagyl  by mouth 3 times a day for 10 days.  clinically, her pain is less than it was last week and she seems to be improving. No fever or chills and vital signs are normal. She will need a colonoscopy next month and then start a  schedule. If condition worsens she needs to report to the emergency room. Return in 3-4 weeks for recheck or sooner as her symptoms do not continue to improve.

## 2013-05-15 NOTE — Patient Instructions (Signed)
Enteritis noted on CT.  Take antibiotics as directed.  Eat well cooked foods.  No lifting.  Need colonoscopy.  If pain worsens go to ED.

## 2013-06-04 ENCOUNTER — Ambulatory Visit (AMBULATORY_SURGERY_CENTER): Payer: Medicare PPO | Admitting: *Deleted

## 2013-06-04 ENCOUNTER — Encounter: Payer: Self-pay | Admitting: Internal Medicine

## 2013-06-04 VITALS — Ht 60.0 in | Wt 151.6 lb

## 2013-06-04 DIAGNOSIS — Z1211 Encounter for screening for malignant neoplasm of colon: Secondary | ICD-10-CM

## 2013-06-04 MED ORDER — NA SULFATE-K SULFATE-MG SULF 17.5-3.13-1.6 GM/177ML PO SOLN: 1.0000 | Freq: Once | ORAL | Status: DC

## 2013-06-04 NOTE — Progress Notes (Signed)
Pt states that last time she had moviprep she became so dehydrated they had to stick her 5 times for a successful IV. She requests to use suprep as she had done for her last colonoscopy that she states was very successful.

## 2013-06-04 NOTE — Progress Notes (Signed)
Denies allergies to eggs or soy products. Denies complications with anesthesia or sedation. 

## 2013-06-12 ENCOUNTER — Ambulatory Visit (INDEPENDENT_AMBULATORY_CARE_PROVIDER_SITE_OTHER): Payer: Medicare PPO | Admitting: Surgery

## 2013-06-12 ENCOUNTER — Encounter (INDEPENDENT_AMBULATORY_CARE_PROVIDER_SITE_OTHER): Payer: Self-pay | Admitting: Surgery

## 2013-06-12 VITALS — BP 132/76 | HR 84 | Resp 14 | Ht 60.0 in | Wt 151.4 lb

## 2013-06-12 DIAGNOSIS — Z85038 Personal history of other malignant neoplasm of large intestine: Secondary | ICD-10-CM

## 2013-06-12 NOTE — Patient Instructions (Signed)
Will schedule for PET to evaluate liver lesion.  Return next month.

## 2013-06-12 NOTE — Progress Notes (Signed)
Subjective:     Patient ID: Linda Grant, female   DOB: 10-03-1941, 72 y.o.   MRN: [id number]  HPI Patient returns to clinic today do to right lower quadrant abdominal pain. 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 She had some pain last Saturday that she thought was from some excessive lifting. She felt better Sunday and traveled to [place] after that. The pain recurred and she flew back and obtain him to be seen. She thought was a muscle strain but wished to be checked out today. The pain is improved over the week. She still has discomfort when she rolls on her right side though. Pain is made worse with movement. No vomiting but some occasional nausea. She is moving her bowels no blood in her stool. She did eat  some ground beef last week and her symptoms started after that. She returns in followup and is doing much better after being started on Cipro. Abdominal pain resolved.  Review of Systems  Constitutional: Positive for activity change. Negative for fever and chills.  Gastrointestinal: Positive for nausea and abdominal pain. Negative for vomiting, diarrhea, constipation and blood in stool.  Musculoskeletal: Negative.        Objective:   Physical Exam  Constitutional: She appears well-developed.  Neck: Normal range of motion. Neck supple.  Cardiovascular: Normal rate and regular rhythm.   Abdominal: Soft. There is no tenderness     Clinical Data: Right lower quadrant abdominal pain, history of  colon cancer, prior hysterectomy, appendectomy, and colon  resection, history of renal stones  CT ABDOMEN AND PELVIS WITH CONTRAST  Technique: Multidetector CT imaging of the abdomen and pelvis was  performed following the standard protocol during bolus  administration of intravenous contrast.  BUN and creatinine were obtained on site at [facility] at  [address] Results: BUN 6 mg/dL, Creatinine 0.7 mg/dL.  Contrast:  OMNIPAQUE IOHEXOL 300 MG/ML SOLN  Comparison: 08/04/2012  Findings: Lung bases are clear.  Stable 9 mm hypoenhancing lesion in the gallbladder fossa (series  2/image 28), likely benign.  Spleen, pancreas, and adrenal glands are within normal limits.  Gallbladder is unremarkable. Mild stranding along the gallbladder  fundus is likely secondary to surrounding inflammatory changes. No  intrahepatic or extrahepatic ductal dilatation.  Kidneys are within normal limits. No hydronephrosis.  Prior right hemicolectomy. Wall thickening/inflammatory changes  along the ileocolic anastomosis ( series 2/image 38) and involving  the distal ileum (series 2/image 43). Trace fluid/stranding  beneath the right mid abdominal wall (series 2/image 32).  No evidence of bowel obstruction. Mild colonic diverticulosis,  without associated inflammatory changes.  Mild atherosclerotic calcifications of the abdominal aorta and  branch vessels.  No abdominopelvic ascites.  No suspicious abdominopelvic lymphadenopathy.  Status post hysterectomy. Bilateral ovaries are unremarkable.  Bladder is within normal limits.  Small fat-containing midline ventral hernia (series 2/image 36).  Mild degenerative changes at L2-3. Grade 1 anterolisthesis of L4  on L5.  IMPRESSION:  Prior right hemicolectomy. No evidence of bowel obstruction.  Wall thickening/inflammatory changes at the ileocolic anastomosis  and involving the distal ileum, favored to reflect  infectious/inflammatory enterocolitis. Given the clinical history,  follow-up colonoscopy is suggested.  No evidence of metastatic disease in the abdomen/pelvis.  Original Report Authenticated By: Charline Bills, M.D.     Assessment:     Inflammation at previous anastomosis 9 mm nodule in GB fossa within liver    Plan:  Feels much better.  CT scan reviewed which shows inflammatory changes of previous anastomosis. There is a small incisional hernia containing  fat only. There is a 9 mm nodule in the gallbladder fossa felt to be benign by radiology. Her last CEA is 1.5 which is greater than at at the time of surgery. I discussed the findings of the CT scan with the patient. She is scheduled for colonoscopy within 2 weeks. She is concerned about the nodule even though the radiologist feels this is benign. I discussed options of observation with repeat imaging in 3 months, PET scanning given that the size of this lesion may not be detectable yet, and biopsy but this would be difficult given its small size. She would like to proceed with PET scanning which I think is reasonable but told her that close followup is still necessary. Return to clinic in one month. I think most of these changes are inflammatory or from infection given that the antibiotics have cleared up quite nicely.

## 2013-06-18 ENCOUNTER — Encounter (HOSPITAL_COMMUNITY)
Admission: RE | Admit: 2013-06-18 | Discharge: 2013-06-18 | Disposition: A | Discharge status: Home / Self Care | Payer: Medicare PPO | Source: Ambulatory Visit | Attending: Surgery | Admitting: Surgery

## 2013-06-18 ENCOUNTER — Telehealth (INDEPENDENT_AMBULATORY_CARE_PROVIDER_SITE_OTHER): Payer: Self-pay

## 2013-06-18 DIAGNOSIS — Z85038 Personal history of other malignant neoplasm of large intestine: Secondary | ICD-10-CM | POA: Insufficient documentation

## 2013-06-18 MED ORDER — FLUDEOXYGLUCOSE F - 18 (FDG) INJECTION
18.8000 | Freq: Once | INTRAVENOUS | Status: AC | PRN
  Administered 2013-06-18: 18.8 via INTRAVENOUS

## 2013-06-18 NOTE — Telephone Encounter (Signed)
Called patient and gave her negative PET scan results. She will follow up in few weeks.

## 2013-06-18 NOTE — Telephone Encounter (Signed)
Message copied by Brennan Bailey on Thu Jun 18, 2013  3:47 PM ------      Message from: Harriette Bouillon A      Created: Thu Jun 18, 2013  3:43 PM       No cancer ------

## 2013-06-19 ENCOUNTER — Encounter: Payer: Medicare PPO | Admitting: Internal Medicine

## 2013-06-24 ENCOUNTER — Other Ambulatory Visit: Payer: Self-pay | Admitting: Internal Medicine

## 2013-06-24 ENCOUNTER — Ambulatory Visit (AMBULATORY_SURGERY_CENTER): Payer: Medicare PPO | Admitting: Internal Medicine

## 2013-06-24 ENCOUNTER — Encounter: Payer: Self-pay | Admitting: Internal Medicine

## 2013-06-24 VITALS — BP 129/70 | HR 74 | Temp 99.0°F | Resp 12 | Ht 60.0 in | Wt 151.0 lb

## 2013-06-24 DIAGNOSIS — D126 Benign neoplasm of colon, unspecified: Secondary | ICD-10-CM

## 2013-06-24 DIAGNOSIS — Z85038 Personal history of other malignant neoplasm of large intestine: Secondary | ICD-10-CM

## 2013-06-24 MED ORDER — SODIUM CHLORIDE 0.9 % IV SOLN: 500.0000 mL | INTRAVENOUS | Status: DC

## 2013-06-24 NOTE — Progress Notes (Signed)
Called to room to assist during endoscopic procedure.  Patient ID and intended procedure confirmed with present staff. Received instructions for my participation in the procedure from the performing physician.  

## 2013-06-24 NOTE — Patient Instructions (Addendum)
Discharge instructions given with verbal understanding. Handouts on polyps,diverticulosis and hemorrhoids given. Resume previous medications. YOU HAD AN ENDOSCOPIC PROCEDURE TODAY AT THE [facility]: Refer to the procedure report that was given to you for any specific questions about what was found during the examination.  If the procedure report does not answer your questions, please call your gastroenterologist to clarify.  If you requested that your care partner not be given the details of your procedure findings, then the procedure report has been included in a sealed envelope for you to review at your convenience later.  YOU SHOULD EXPECT: Some feelings of bloating in the abdomen. Passage of more gas than usual.  Walking can help get rid of the air that was put into your GI tract during the procedure and reduce the bloating. If you had a lower endoscopy (such as a colonoscopy or flexible sigmoidoscopy) you may notice spotting of blood in your stool or on the toilet paper. If you underwent a bowel prep for your procedure, then you may not have a normal bowel movement for a few days.  DIET: Your first meal following the procedure should be a light meal and then it is ok to progress to your normal diet.  A half-sandwich or bowl of soup is an example of a good first meal.  Heavy or fried foods are harder to digest and may make you feel nauseous or bloated.  Likewise meals heavy in dairy and vegetables can cause extra gas to form and this can also increase the bloating.  Drink plenty of fluids but you should avoid alcoholic beverages for 24 hours.  ACTIVITY: Your care partner should take you home directly after the procedure.  You should plan to take it easy, moving slowly for the rest of the day.  You can resume normal activity the day after the procedure however you should NOT DRIVE or use heavy machinery for 24 hours (because of the sedation medicines used during the test).    SYMPTOMS TO  REPORT IMMEDIATELY: A gastroenterologist can be reached at any hour.  During normal business hours, 8:30 AM to 5:00 PM Monday through Friday, call [phone].  After hours and on weekends, please call the GI answering service at [phone] who will take a message and have the physician on call contact you.   Following lower endoscopy (colonoscopy or flexible sigmoidoscopy):  Excessive amounts of blood in the stool  Significant tenderness or worsening of abdominal pains  Swelling of the abdomen that is new, acute  Fever of 100F or higher  FOLLOW UP: If any biopsies were taken you will be contacted by phone or by letter within the next 1-3 weeks.  Call your gastroenterologist if you have not heard about the biopsies in 3 weeks.  Our staff will call the home number listed on your records the next business day following your procedure to check on you and address any questions or concerns that you may have at that time regarding the information given to you following your procedure. This is a courtesy call and so if there is no answer at the home number and we have not heard from you through the emergency physician on call, we will assume that you have returned to your regular daily activities without incident.  SIGNATURES/CONFIDENTIALITY: You and/or your care partner have signed paperwork which will be entered into your electronic medical record.  These signatures attest to the fact that that the information above on your After Visit   Summary has been reviewed and is understood.  Full responsibility of the confidentiality of this discharge information lies with you and/or your care-partner.  

## 2013-06-24 NOTE — Op Note (Addendum)
[facility] [address] [place], [address]   COLONOSCOPY PROCEDURE REPORT  PATIENT: Linda, Grant  MR#: #[id number] BIRTHDATE: 21-Mar-1941 , 72  yrs. old GENDER: Female ENDOSCOPIST: Hart Carwin, MD REFERRED BY:  Shaune Pollack, M.D. 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 MEDICATIONS: MAC sedation, administered by CRNA and propofol (Diprivan) 200mg  IV  DESCRIPTION OF PROCEDURE:   After the risks and benefits and of the procedure were explained, informed consent was obtained.  A digital rectal exam revealed hemorrhoids.    The LB [id number]-H190 [id number] endoscope was introduced through the anus and advanced to the terminal ileum which was intubated for a short distance .  The quality of the prep was excellent, using MoviPrep .  The instrument was then slowly withdrawn as the colon was fully examined.     COLON FINDINGS: There was evidence of a prior end-to-end ileocolonic surgical anastomosis.   There was moderate diverticulosis noted in the sigmoid colon with associated tortuosity, muscular hypertrophy and angulation.   Small internal hemorrhoids were found.   Three diminutive sessile polyps were found in the ascending colon.at the anastomosis which was widely open, terminal ileum was entered and appeared normal  A polypectomy was performed with cold forceps.polyps may be lymphoid follicles  The resection was complete and the polyp tissue was completely retrieved. Retroflexed views revealed no abnormalities.     The scope was then withdrawn from the patient and the procedure completed.  COMPLICATIONS: There were no complications. ENDOSCOPIC IMPRESSION: 1.   There was evidence of a prior ileocolonic surgical  anastomosis which was widely patent 2.   There was moderate diverticulosis noted in the sigmoid colon 3.   Small internal hemorrhoids 4.   Three diminutive sessile polyps were found in the ascending colon; polypectomy was performed with cold forceps , polyps may represent lymphoid follicles 5. no evidence of recurrent carcinoma  RECOMMENDATIONS: 1.  Await biopsy results 2.  High fiber diet   REPEAT EXAM: In 2 year(s)  for Colonoscopy.  cc:  _______________________________ eSigned:  Hart Carwin, MD 06/24/2013 10:51 AM Revised: 06/24/2013 10:51 AM    PATIENT NAME:  Linda, Grant MR#: #[id number]

## 2013-06-24 NOTE — Progress Notes (Signed)
Patient did not experience any of the following events: a burn prior to discharge; a fall within the facility; wrong site/side/patient/procedure/implant event; or a hospital transfer or hospital admission upon discharge from the facility. (G8907) Patient did not have preoperative order for IV antibiotic SSI prophylaxis. (G8918)  

## 2013-06-26 ENCOUNTER — Telehealth: Payer: Self-pay

## 2013-06-26 NOTE — Telephone Encounter (Signed)
  Follow up Call-  Call back number 06/24/2013 07/02/2012  Post procedure Call Back phone  # [phone]  Permission to leave phone message Yes Yes     Patient questions:  Do you have a fever, pain , or abdominal swelling? no Pain Score  0 *  Have you tolerated food without any problems? yes  Have you been able to return to your normal activities? yes  Do you have any questions about your discharge instructions: Diet   no Medications  no Follow up visit  no  Do you have questions or concerns about your Care? no  Actions: * If pain score is 4 or above: No action needed, pain <4.

## 2013-06-29 ENCOUNTER — Encounter: Payer: Self-pay | Admitting: Internal Medicine

## 2013-06-29 ENCOUNTER — Ambulatory Visit (INDEPENDENT_AMBULATORY_CARE_PROVIDER_SITE_OTHER): Payer: Medicare PPO | Admitting: Surgery

## 2013-07-08 ENCOUNTER — Other Ambulatory Visit: Payer: Self-pay

## 2013-07-09 ENCOUNTER — Encounter (INDEPENDENT_AMBULATORY_CARE_PROVIDER_SITE_OTHER): Payer: Self-pay | Admitting: Surgery

## 2013-07-09 ENCOUNTER — Ambulatory Visit (INDEPENDENT_AMBULATORY_CARE_PROVIDER_SITE_OTHER): Payer: Medicare PPO | Admitting: Surgery

## 2013-07-09 VITALS — BP 124/72 | HR 84 | Temp 97.6°F | Resp 15 | Ht 61.5 in | Wt 153.0 lb

## 2013-07-09 DIAGNOSIS — K432 Incisional hernia without obstruction or gangrene: Secondary | ICD-10-CM | POA: Insufficient documentation

## 2013-07-09 DIAGNOSIS — Z85038 Personal history of other malignant neoplasm of large intestine: Secondary | ICD-10-CM

## 2013-07-09 NOTE — Patient Instructions (Signed)
Return in 6 months

## 2013-07-09 NOTE — Progress Notes (Signed)
Subjective:     Patient ID: Linda Grant, female   DOB: March 28, 1941, 72 y.o.   MRN: [id number]  HPI Patient returns to clinic today do to right lower quadrant abdominal pain. 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 She had some pain last Saturday that she thought was from some excessive lifting. She felt better Sunday and traveled to [place] after that. The pain recurred and she flew back and obtain him to be seen. She thought was a muscle strain but wished to be checked out today. The pain is improved over the week. She still has discomfort when she rolls on her right side though. Pain is made worse with movement. No vomiting but some occasional nausea. She is moving her bowels no blood in her stool. She did eat  some ground beef last week and her symptoms started after that. She returns in followup and is doing much better after being started on Cipro. Abdominal pain resolved.  Review of Systems  Constitutional: Positive for activity change. Negative for fever and chills.  Gastrointestinal: Positive for nausea and abdominal pain. Negative for vomiting, diarrhea, constipation and blood in stool.  Musculoskeletal: Negative.        Objective:   Physical Exam  Constitutional: She appears well-developed.  Neck: Normal range of motion. Neck supple.  Cardiovascular: Normal rate and regular rhythm.   Abdominal: Soft. There is no tenderness  Small incisional hernia noted reducible  Clinical Data: Subsequent treatment strategy for colon cancer.  NUCLEAR MEDICINE PET SKULL BASE TO THIGH  Fasting Blood Glucose: 116  Technique: 18.8 mCi F-18 FDG was injected intravenously. CT data  was obtained and used for attenuation correction and anatomic  localization only. (This was not acquired as a diagnostic CT  examination.) Additional exam technical data entered on  technologist worksheet.  Comparison: CT scan 05/15/2013  Findings:  Neck: No  hypermetabolic lymph nodes in the neck.  Chest: No hypermetabolic mediastinal or hilar nodes. No  suspicious pulmonary nodules on the CT scan.  Abdomen/Pelvis: No abnormal hypermetabolic activity within the  liver, pancreas, adrenal glands, or spleen. No hypermetabolic  lymph nodes in the abdomen or pelvis.  The inflammatory process noted in the right upper quadrant near the  ileal colonic anastomoses has resolved. This is likely  inflammatory disease.  There is a small stable hepatic cyst. No findings for hepatic  metastatic disease.  A small anterior abdominal wall hernia is noted containing omental  fat.  Skeleton: No focal hypermetabolic activity to suggest skeletal  metastasis.  IMPRESSION:  Negative PET CT for recurrent or metastatic disease.  Original Report Authenticated By: Rudie Meyer, M.D.              Assessment:     Inflammation at previous anastomosis resolved  Incisional hernia minimally symptomatic   9 mm nodule in GB fossa within liver by PET appears benign    Plan:    doing well. Return to clinic 6 months. Follow incisional hernia. Colonoscopy look good. PET scan showed no evidence of metastatic disease. Further followup per oncology.

## 2013-08-26 ENCOUNTER — Other Ambulatory Visit (HOSPITAL_BASED_OUTPATIENT_CLINIC_OR_DEPARTMENT_OTHER): Payer: Medicare PPO

## 2013-08-26 DIAGNOSIS — C189 Malignant neoplasm of colon, unspecified: Secondary | ICD-10-CM

## 2013-08-27 ENCOUNTER — Telehealth: Payer: Self-pay | Admitting: Oncology

## 2013-08-27 ENCOUNTER — Ambulatory Visit (HOSPITAL_BASED_OUTPATIENT_CLINIC_OR_DEPARTMENT_OTHER): Payer: Medicare PPO | Admitting: Oncology

## 2013-08-27 VITALS — BP 139/74 | HR 102 | Temp 97.8°F | Resp 18 | Ht 61.5 in | Wt 156.4 lb

## 2013-08-27 DIAGNOSIS — Z23 Encounter for immunization: Secondary | ICD-10-CM

## 2013-08-27 DIAGNOSIS — C189 Malignant neoplasm of colon, unspecified: Secondary | ICD-10-CM

## 2013-08-27 MED ORDER — INFLUENZA VAC SPLIT QUAD 0.5 ML IM SUSP
0.5000 mL | INTRAMUSCULAR | Status: AC
  Administered 2013-08-27: 0.5 mL via INTRAMUSCULAR
  Filled 2013-08-27: qty 0.5

## 2013-08-27 NOTE — Telephone Encounter (Signed)
gv adn printed appt sched and avs for pt for March 2015  °

## 2013-08-27 NOTE — Progress Notes (Signed)
   [facility]    OFFICE PROGRESS NOTE   INTERVAL HISTORY:   She returns as scheduled. She feels well. She had an episode of abdominal pain in June and a CT revealed inflammatory changes at the ileocolic anastomosis. She reports being treated with antibiotics and the pain resolved. She underwent a colonoscopy on 06/24/2013. This revealed a patent ileocolic anastomosis. 3 polyps were removed from the ascending colon. The pathology revealed benign colonic mucosa with prominent lymphoid aggregate. She is scheduled for a repeat colonoscopy in 2 years.  Objective:  Vital signs in last 24 hours:  Blood pressure 139/74, pulse 102, temperature 97.8 F (36.6 C), temperature source Oral, resp. rate 18, height 5' 1.5" (1.562 m), weight 156 lb 6.4 oz (70.943 kg).    HEENT: Neck without mass Lymphatics: No cervical, supra-clavicular, axillary, or inguinal nodes Resp: Lungs clear bilaterally Cardio: Regular rate and rhythm GI: No hepatosplenomegaly, no mass Vascular: No leg edema   Lab Results:  CEA on 08/26/2013-2.0   Medications: I have reviewed the patient's current medications.  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 Preoperative anemia, July 2013  4. Family history of colon/pancreas and lung cancer   Disposition:  She remains in clinical remission from colon cancer. Ms. Lumadue will continue colonoscopy followup with Dr. Juanda Chance. Despite the negative genetic testing she and her family remained at increased  risk of developing colorectal cancer. She will return for an office visit and CEA in 6 months.   Thornton Papas, MD  08/27/2013  10:40 AM

## 2013-11-15 IMAGING — CR DG ABDOMEN 2V
2 series · 2 of 2 positions shown · non-contrast
Comparison: One-view abdomen x-ray and CT abdomen and pelvis
07/22/2012.

CLINICAL DATA: Postop colon resection 6 days ago, with nausea,
vomiting, diarrhea, abdominal pain.  Follow up ileus.

ABDOMEN - 2 VIEW

[w abdomen upright]
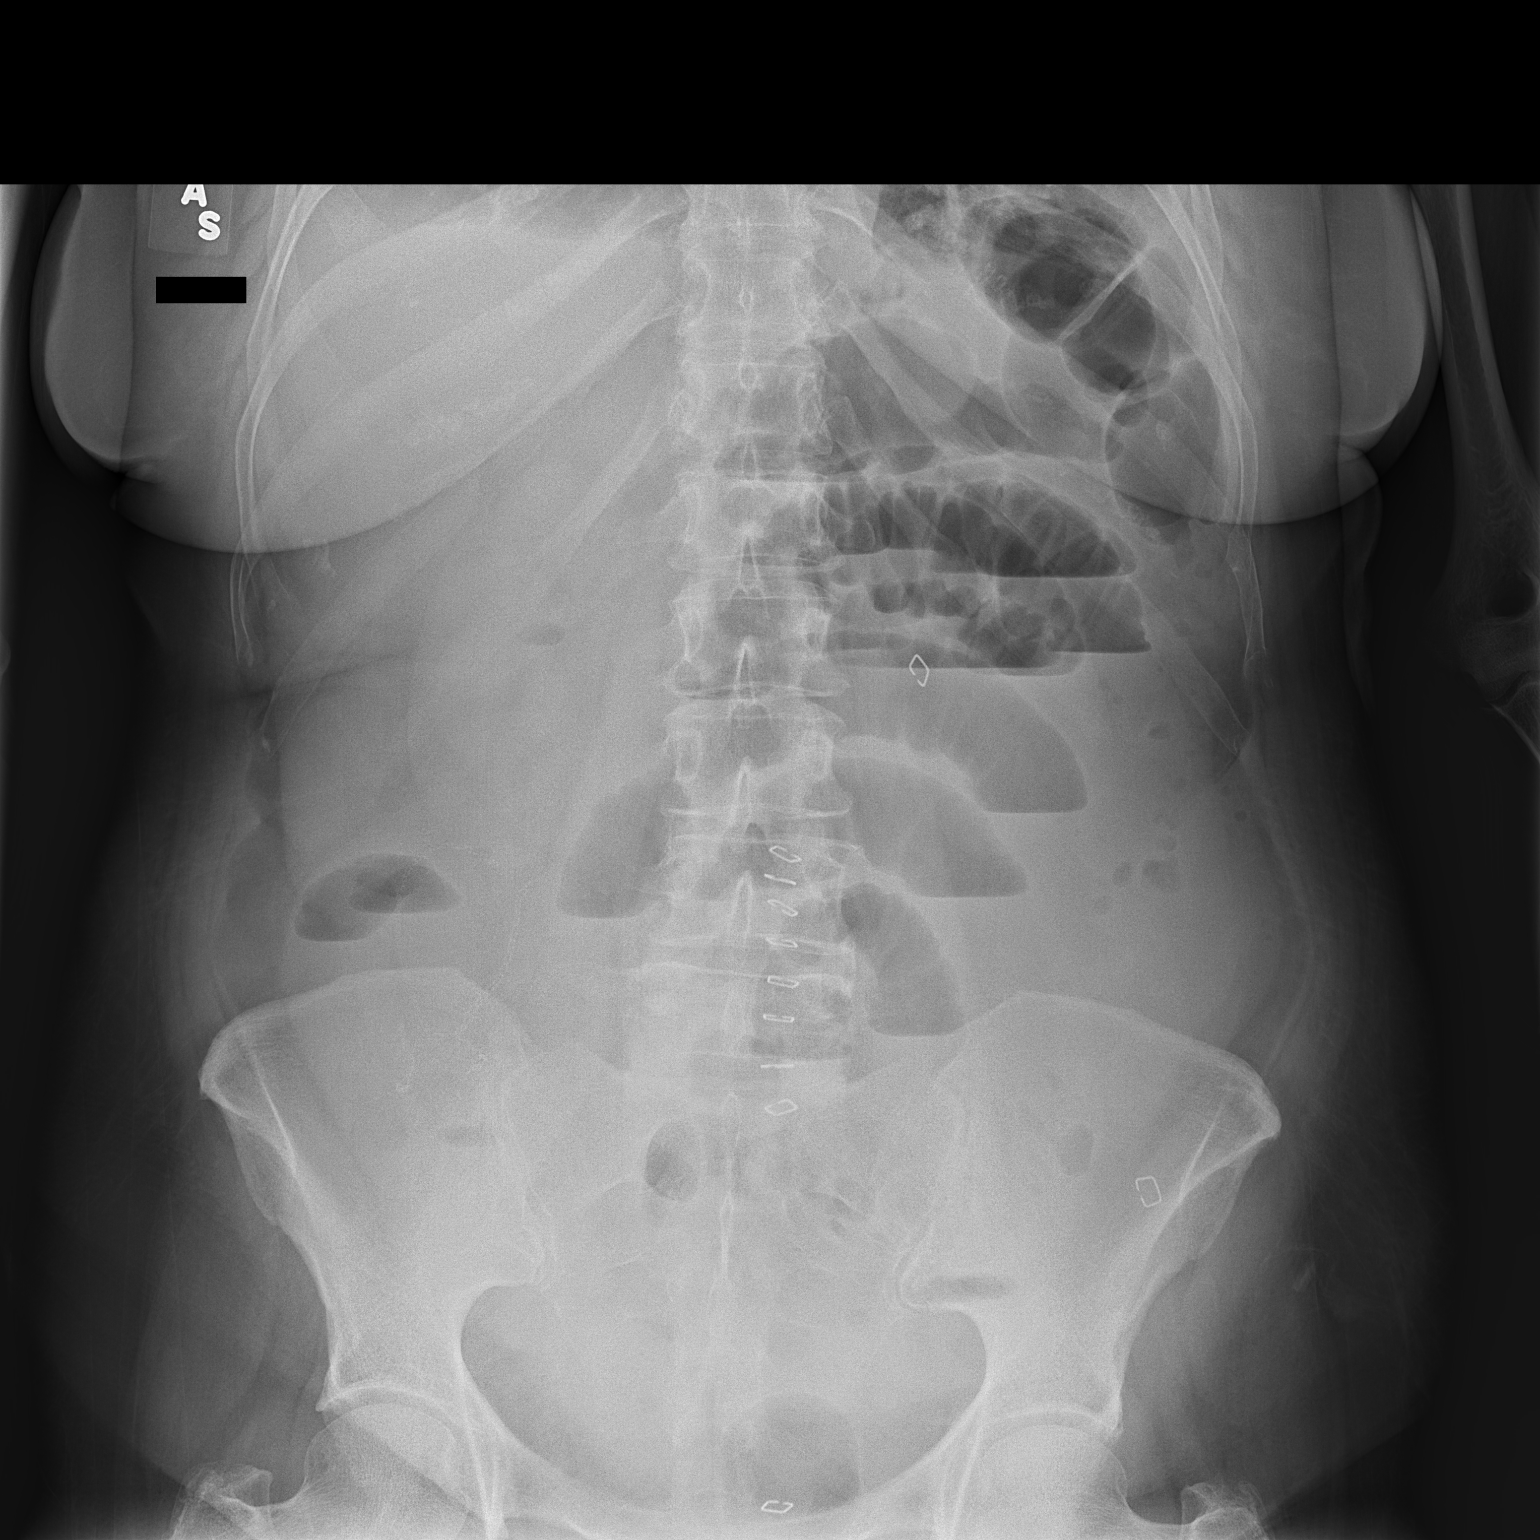

[t abdomen supine]
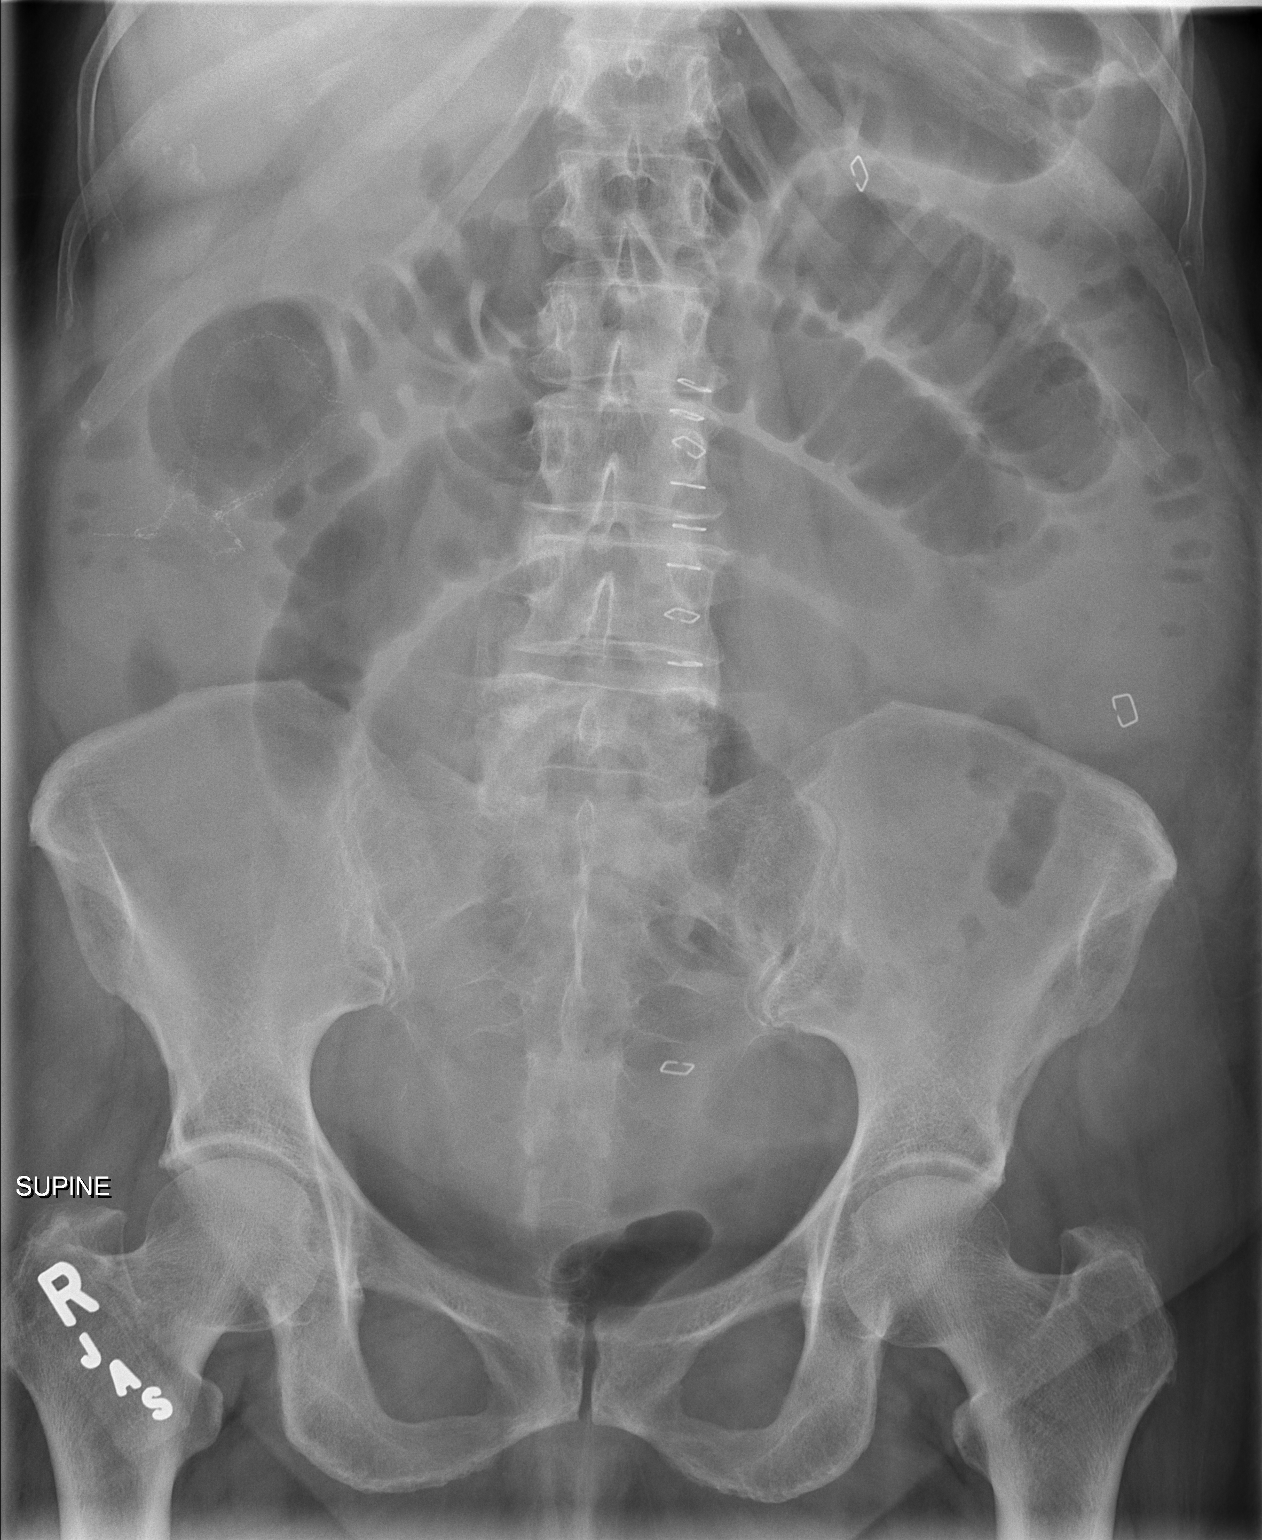

[2 of 2 positions shown; findings below may reference images not displayed]

FINDINGS: Persistent gaseous distention of multiple loops of small
bowel throughout the abdomen, more so than on the examination 3
days ago.  Gas within upper normal caliber transverse colon and
proximal descending colon.  Gas present throughout the colon to the
rectum.  Surgical  anastomotic suture material in the right upper
quadrant.  No free intraperitoneal air on the erect image.  Small
bowel and colonic air fluid levels.
IMPRESSION: Worsening postoperative ileus.  No free intraperitoneal air.

## 2014-01-15 ENCOUNTER — Encounter (INDEPENDENT_AMBULATORY_CARE_PROVIDER_SITE_OTHER): Payer: Self-pay | Admitting: Surgery

## 2014-01-15 ENCOUNTER — Ambulatory Visit (INDEPENDENT_AMBULATORY_CARE_PROVIDER_SITE_OTHER): Payer: Medicare Other | Admitting: Surgery

## 2014-01-15 VITALS — BP 138/88 | HR 92 | Temp 98.3°F | Resp 14 | Ht 60.0 in | Wt 156.4 lb

## 2014-01-15 DIAGNOSIS — Z85038 Personal history of other malignant neoplasm of large intestine: Secondary | ICD-10-CM

## 2014-01-15 NOTE — Progress Notes (Signed)
Subjective:     Patient ID: Burley Saver, female   DOB: 05-May-1941, 73 y.o.   MRN: [id number]  HPI   Shorter to clinic after laparoscopic right hemicolectomy in August of 2013. She has been tested for Lynch syndrome insofar as negative. Underwent trial at [facility] IPF with CT wich showed fatty liver and thickened distal esophagus.  Will ask Dr Olevia Perches about that.  No more pain.  Bowels moving.     Review of Systems  HENT: Negative.   Cardiovascular: Negative.   Gastrointestinal: Negative.        Objective:   Physical Exam  Constitutional: She appears well-developed.  Eyes: EOM are normal. Pupils are equal, round, and reactive to light.  Abdominal: There is no tenderness.  2 cm incisional hernia noted unchanged non tender   Skin: Skin is warm and dry.  Psychiatric: She has a normal mood and affect. Her behavior is normal. Judgment and thought content normal.       Assessment:     Stage II colon cancer stable s/p resection 2013    Plan:     Return 12 months.continue oncology follow up

## 2014-01-15 NOTE — Patient Instructions (Signed)
Return 1 year. 

## 2014-01-25 ENCOUNTER — Other Ambulatory Visit: Payer: Self-pay

## 2014-01-25 DIAGNOSIS — Z1231 Encounter for screening mammogram for malignant neoplasm of breast: Secondary | ICD-10-CM

## 2014-02-09 ENCOUNTER — Ambulatory Visit
Admission: RE | Admit: 2014-02-09 | Discharge: 2014-02-09 | Disposition: A | Discharge status: Home / Self Care | Payer: Medicare Other | Source: Ambulatory Visit

## 2014-02-09 DIAGNOSIS — Z1231 Encounter for screening mammogram for malignant neoplasm of breast: Secondary | ICD-10-CM

## 2014-02-23 ENCOUNTER — Other Ambulatory Visit (HOSPITAL_BASED_OUTPATIENT_CLINIC_OR_DEPARTMENT_OTHER): Payer: Medicare Other

## 2014-02-23 DIAGNOSIS — C180 Malignant neoplasm of cecum: Secondary | ICD-10-CM

## 2014-02-23 DIAGNOSIS — C189 Malignant neoplasm of colon, unspecified: Secondary | ICD-10-CM

## 2014-02-24 LAB — CEA: CEA: 1.6 ng/mL (ref 0.0–5.0)

## 2014-02-25 ENCOUNTER — Ambulatory Visit (HOSPITAL_BASED_OUTPATIENT_CLINIC_OR_DEPARTMENT_OTHER): Payer: Medicare Other | Admitting: Oncology

## 2014-02-25 ENCOUNTER — Telehealth: Payer: Self-pay | Admitting: Oncology

## 2014-02-25 VITALS — BP 133/72 | HR 97 | Temp 98.1°F | Resp 18 | Ht 60.0 in | Wt 152.1 lb

## 2014-02-25 DIAGNOSIS — Z800 Family history of malignant neoplasm of digestive organs: Secondary | ICD-10-CM

## 2014-02-25 DIAGNOSIS — Z801 Family history of malignant neoplasm of trachea, bronchus and lung: Secondary | ICD-10-CM

## 2014-02-25 DIAGNOSIS — C180 Malignant neoplasm of cecum: Secondary | ICD-10-CM

## 2014-02-25 DIAGNOSIS — C189 Malignant neoplasm of colon, unspecified: Secondary | ICD-10-CM

## 2014-02-25 DIAGNOSIS — R911 Solitary pulmonary nodule: Secondary | ICD-10-CM

## 2014-02-25 NOTE — Telephone Encounter (Signed)
gv pt appt schedule for sept/oct °

## 2014-02-25 NOTE — Progress Notes (Signed)
  [place] OFFICE PROGRESS NOTE    Diagnosis: Colon cancer  INTERVAL HISTORY:   She returns for scheduled followup. She feels well. Frequent bowel movements since undergoing colon surgery. Multiple family members have been diagnosed with pulmonary fibrosis. She underwent a chest CT at [facility] as part of a pulmonary fibrosis study. The CT revealed hepatic steatosis and thickening of the lower esophagus. No fibrosis. 4 mm subpleural nodule.  Objective:  Vital signs in last 24 hours:  Blood pressure 133/72, pulse 97, temperature 98.1 F (36.7 C), temperature source Oral, resp. rate 18, height 5' (1.524 m), weight 152 lb 1.6 oz (68.992 kg), SpO2 100.00%.    HEENT: Neck without mass Lymphatics: No cervical, supraclavicular, axillary, or inguinal nodes Resp: Lungs clear bilaterally Cardio: Regular rate and rhythm GI: No hepatosplenomegaly, nontender, no mass Vascular: No leg edema   Lab Results:   Lab Results  Component Value Date   CEA 1.6 02/23/2014    Imaging:  No results found.  Medications: I have reviewed the patient's current medications.  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 Preoperative anemia, July 2013  4. Family history of colon/pancreas and lung cancer  5. Family history of pulmonary fibrosis 6. 4 mm subpleural nodule in the lateral basal segment of the left lower lobe on a CT 11/05/2013 at [facility]    Disposition:  She remains in clinical remission from colon cancer.  She will return for an office visit and CEA in 6 months.  Ms. Linda Grant will discuss the hepatic steatosis and esophageal thickening with Dr. Olevia Perches.  Linda Coder, MD  02/25/2014  4:24 PM

## 2014-03-26 ENCOUNTER — Encounter: Payer: Self-pay | Admitting: *Deleted

## 2014-04-20 ENCOUNTER — Ambulatory Visit: Payer: Medicare Other | Admitting: Internal Medicine

## 2014-06-03 ENCOUNTER — Ambulatory Visit (INDEPENDENT_AMBULATORY_CARE_PROVIDER_SITE_OTHER): Payer: Medicare Other | Admitting: Internal Medicine

## 2014-06-03 ENCOUNTER — Other Ambulatory Visit: Payer: Self-pay

## 2014-06-03 ENCOUNTER — Encounter: Payer: Self-pay | Admitting: Internal Medicine

## 2014-06-03 VITALS — BP 136/74 | HR 88 | Ht 60.0 in | Wt 146.4 lb

## 2014-06-03 DIAGNOSIS — Z85038 Personal history of other malignant neoplasm of large intestine: Secondary | ICD-10-CM

## 2014-06-03 DIAGNOSIS — R9389 Abnormal findings on diagnostic imaging of other specified body structures: Secondary | ICD-10-CM

## 2014-06-03 DIAGNOSIS — K625 Hemorrhage of anus and rectum: Secondary | ICD-10-CM

## 2014-06-03 MED ORDER — PEG-KCL-NACL-NASULF-NA ASC-C 100 G PO SOLR: 1.0000 | Freq: Once | ORAL | Status: DC

## 2014-06-03 MED ORDER — NA SULFATE-K SULFATE-MG SULF 17.5-3.13-1.6 GM/177ML PO SOLN: ORAL | Status: DC

## 2014-06-03 NOTE — Progress Notes (Signed)
Linda Grant 1941/11/20 [id number]  Note: This dictation was prepared with Dragon digital system. Any transcriptional errors that result from this procedure are unintentional.   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 Status post  laparoscopically assisted right hemicolectomy in July 2013. She has a family history of colon cancer in her sister and personal history of colon polyps in 1992, 1999. His most recent colonoscopies in 2003 in 2008. She has been followed by Dr. Benay Spice and her CEA level has been normal at 1.5. She is having loose stools but denies actual diarrhea abdominal pain. She sees small amount of BRBPR , last colonoscopy in July 2014 showed widely patent anastomosis. CT scan of the chest done for evaluation of pulmonary fibrosis shows thickening  Of                                                                                                                                                                                                                                                                                 the lower esophagus which could represent explained  hiatal hernia or possible esophagitis or neoplasm.. She has a history of  gastroesophageal reflux treated with PPIs. Her mother had esophageal stricture and sister has gastroesophageal reflux and question of Barrett's esophagus. Patient is interested in pursuing evaluation of Barrett's esophagus    Past Medical History  Diagnosis Date  . Elevated cholesterol   . Hypertension   . GERD (gastroesophageal reflux disease)   . Vitamin D deficiency   . Hyperplastic colon polyp   . Diverticulosis   . Hx of adenomatous colonic polyps 07/02/12  . Anemia   . 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 Diabetes mellitus     type 2 - controlled  . Mitral valve prolapse   . Rectal bleeding   . Blood in stool   . Heart  murmur   . Arthritis   . Colon cancer   . Internal hemorrhoids   . Hepatic lesion   . Ventral hernia     Past Surgical History  Procedure Laterality Date  .  Vaginal hysterectomy    . Mouth surgery      dental implants  . Dental surgery  1995    Dental Implants  . Partial colectomy  07/28/2012    Allergies  Allergen Reactions  . Penicillins Anaphylaxis    Family history and social history have been reviewed.  Review of Systems: Negative for dysphagia odynophagia  The remainder of the 10 point ROS is negative except as outlined in the H&P  Physical Exam: General Appearance Well developed, in no distress Eyes  Non icteric  HEENT  Non traumatic, normocephalic  Mouth No lesion, tongue papillated, no cheilosis Neck Supple without adenopathy, thyroid not enlarged, no carotid bruits, no JVD Lungs Clear to auscultation bilaterally COR Normal S1, normal S2, regular rhythm, no murmur, quiet precordium Abdomen soft nontender with normoactive bowel sounds. Minimal tenderness left lower quadrant. Rectal not done Extremities  No pedal edema Skin No lesions Neurological Alert and oriented x 3 Psychological Normal mood and affect  Assessment and Plan:   73 year old white female 2 years post resection of a cecal carcinoma with the no evidence of recurrence as per last colonoscopy in July 2014. She has low volume hematochezia likely related to hemorrhoids. She would like to pursue colonoscopy to make sure no recurrence.CEA 1.5 we were planning a recall colonoscopy 4 2016 but because of the hematochezia she wants to proceed with colonoscopy now  Abnormal CT scan of the chest raising question of thickening in the distal esophagus. This could be due to hiatal hernia . Because of family history of reflux and Barrett's esophagus he will proceed with upper endoscopy both procedures would be done within one day    Delfin Edis 06/03/2014

## 2014-06-03 NOTE — Patient Instructions (Addendum)
You have been scheduled for an endoscopy and colonoscopy. Please follow the written instructions given to you at your visit today. Please pick up your prep at the pharmacy within the next 1-3 days. If you use inhalers (even only as needed), please bring them with you on the day of your procedure. Your physician has requested that you go to www.startemmi.com and enter the access code given to you at your visit today. This web site gives a general overview about your procedure. However, you should still follow specific instructions given to you by our office regarding your preparation for the procedure.   CC: Dr Darcus Austin, Dr B.Sherrill, Dr Shann Medal

## 2014-07-27 ENCOUNTER — Telehealth: Payer: Self-pay | Admitting: Internal Medicine

## 2014-07-27 NOTE — Telephone Encounter (Signed)
Pt's questions were answered about diet restrictions for procedure

## 2014-07-28 ENCOUNTER — Other Ambulatory Visit: Payer: Self-pay | Admitting: Internal Medicine

## 2014-07-28 ENCOUNTER — Ambulatory Visit (AMBULATORY_SURGERY_CENTER): Payer: Medicare Other | Admitting: Internal Medicine

## 2014-07-28 ENCOUNTER — Encounter: Payer: Self-pay | Admitting: Internal Medicine

## 2014-07-28 VITALS — BP 151/78 | HR 73 | Temp 96.9°F | Resp 13 | Ht 60.0 in | Wt 146.0 lb

## 2014-07-28 DIAGNOSIS — K2270 Barrett's esophagus without dysplasia: Secondary | ICD-10-CM

## 2014-07-28 DIAGNOSIS — K2289 Other specified disease of esophagus: Secondary | ICD-10-CM

## 2014-07-28 DIAGNOSIS — Z85038 Personal history of other malignant neoplasm of large intestine: Secondary | ICD-10-CM

## 2014-07-28 DIAGNOSIS — K2100 Gastro-esophageal reflux disease with esophagitis, without bleeding: Secondary | ICD-10-CM

## 2014-07-28 DIAGNOSIS — R9389 Abnormal findings on diagnostic imaging of other specified body structures: Secondary | ICD-10-CM

## 2014-07-28 DIAGNOSIS — Z800 Family history of malignant neoplasm of digestive organs: Secondary | ICD-10-CM

## 2014-07-28 DIAGNOSIS — K228 Other specified diseases of esophagus: Secondary | ICD-10-CM

## 2014-07-28 DIAGNOSIS — K625 Hemorrhage of anus and rectum: Secondary | ICD-10-CM

## 2014-07-28 LAB — GLUCOSE, CAPILLARY
GLUCOSE-CAPILLARY: 96 mg/dL (ref 70–99)
Glucose-Capillary: 89 mg/dL (ref 70–99)

## 2014-07-28 MED ORDER — SODIUM CHLORIDE 0.9 % IV SOLN: 500.0000 mL | INTRAVENOUS | Status: DC

## 2014-07-28 MED ORDER — HYDROCORTISONE ACETATE 25 MG RE SUPP: 25.0000 mg | Freq: Every evening | RECTAL | Status: DC | PRN

## 2014-07-28 NOTE — Op Note (Signed)
[place]  [address] [place], [id number]   COLONOSCOPY PROCEDURE REPORT  PATIENT: Linda Grant, Linda Grant  MR#: #[id number] BIRTHDATE: 11-28-1941 , 73  yrs. old GENDER: Female ENDOSCOPIST: Lafayette Dragon, MD REFERRED KM:MNOTR Inda Merlin, M.D. PROCEDURE DATE:  07/28/2014 PROCEDURE:   Colonoscopy, surveillance First Screening Colonoscopy - Avg.  risk and is 50 yrs.  old or older - No.  Prior Negative Screening - Now for repeat screening. N/A  History of Adenoma - Now for follow-up colonoscopy & has been > or = to 3 yrs.  N/A  Polyps Removed Today? No.  Recommend repeat exam, <10 yrs? Yes.  High risk (family or personal hx). ASA CLASS:   Class II INDICATIONS:cecal carcinoma T3N0 resect it with a right hemicolectomy in 2013.  Last colonoscopy July 2014 showed normal [place] alcoholic anastomoses.  Positive family history of colon cancer in a sister..  Low-volume rectal bleeding. MEDICATIONS: MAC sedation, administered by CRNA and propofol (Diprivan) 100mg  IV  DESCRIPTION OF PROCEDURE:   After the risks benefits and alternatives of the procedure were thoroughly explained, informed consent was obtained.  A digital rectal exam revealed no abnormalities of the rectum.   The LB [id number]  endoscope was introduced through the anus and advanced to the surgical anastomosis. No adverse events experienced.   The quality of the prep was good, using MoviPrep  The instrument was then slowly withdrawn as the colon was fully examined.      COLON FINDINGS: There was evidence of a prior end-to-end ileocolonic surgical anastomosis in the ascending colon.   Small internal hemorrhoids were found.  Retroflexed views revealed no abnormalities. The time to cecum=4 minutes 13 seconds.  Withdrawal time=6 minutes 03 seconds.  The scope was withdrawn and the procedure completed. COMPLICATIONS: There were no complications.  ENDOSCOPIC IMPRESSION: 1.   There was evidence of a  prior ileocolonic surgical anastomosis in the ascending colon 2.   Small internal hemorrhoids 3. mild sigmoid diverticulosis RECOMMENDATIONS: high fiber diet Anusol HC suppositories for bleeding hemorrhoids Recall colonoscopy in 3 years   eSigned:  Lafayette Dragon, MD 07/28/2014 2:42 PM   cc:   PATIENT NAME:  Linda Grant, Linda Grant MR#: #[id number]

## 2014-07-28 NOTE — Progress Notes (Signed)
Report to PACU, RN, vss, BBS= Clear.  

## 2014-07-28 NOTE — Op Note (Signed)
[place]  [address]. [place], [address]   ENDOSCOPY PROCEDURE REPORT  PATIENT: Linda, Grant  MR#: #[id number] BIRTHDATE: 02-Dec-1941 , 73  yrs. old GENDER: Female ENDOSCOPIST: Lafayette Dragon, MD REFERRED BY:  Darcus Austin, M.D. PROCEDURE DATE:  07/28/2014 PROCEDURE:  EGD w/ biopsy ASA CLASS:     Class II INDICATIONS:  abnormal CT of the GI tract.   Nonspecific thickening of the distal esophagus on CT scan of the abdomen. MEDICATIONS: MAC sedation, administered by CRNA and propofol (Diprivan) 200mg  IV TOPICAL ANESTHETIC: none  DESCRIPTION OF PROCEDURE: After the risks benefits and alternatives of the procedure were thoroughly explained, informed consent was obtained.  The LB [id number] endoscope was introduced through the mouth and advanced to the second portion of the duodenum. Without limitations.  The instrument was slowly withdrawn as the mucosa was fully examined.      Esophagus:  proximal and mid-esophageal mucosa appeared normal. There were several short erosions at the GE junction consistent with grade a esophagitis. There was no stricture. There was a 3 cm nonreducible hiatal hernia distal to the squamocolumnar junction  Stomach: gastric fullness and gastric cancer were unremarkable. Gastric outlet was normal. Retroflexion of the endoscope confirmed the presence of a hiatal hernia  Duodenum: duodenal bulb and descending duodenum was normal[ The scope was then withdrawn from the patient and the procedure completed.  COMPLICATIONS: There were no complications. ENDOSCOPIC IMPRESSION: 1. grade 1 esophagitis status post biopsies to rule out Barrett's esophagus 2. 3 cm nonreducible hiatal hernia accounting for abnormality on the CT scan of the abdomen corresponding to the nonspecific thickening of the distal esophagus RECOMMENDATIONS:  1. antireflux measures 2. continue PPI 3. await results of the biopsies  REPEAT  EXAM: for EGD pending biopsy results.  eSigned:  Lafayette Dragon, MD 07/28/2014 2:36 PM   CC:  PATIENT NAME:  Linda, Grant MR#: #[id number]

## 2014-07-28 NOTE — Patient Instructions (Signed)
YOU HAD AN ENDOSCOPIC PROCEDURE TODAY AT THE [facility]: Refer to the procedure report that was given to you for any specific questions about what was found during the examination.  If the procedure report does not answer your questions, please call your gastroenterologist to clarify.  If you requested that your care partner not be given the details of your procedure findings, then the procedure report has been included in a sealed envelope for you to review at your convenience later.  YOU SHOULD EXPECT: Some feelings of bloating in the abdomen. Passage of more gas than usual.  Walking can help get rid of the air that was put into your GI tract during the procedure and reduce the bloating. If you had a lower endoscopy (such as a colonoscopy or flexible sigmoidoscopy) you may notice spotting of blood in your stool or on the toilet paper. If you underwent a bowel prep for your procedure, then you may not have a normal bowel movement for a few days.  DIET: Your first meal following the procedure should be a light meal and then it is ok to progress to your normal diet.  A half-sandwich or bowl of soup is an example of a good first meal.  Heavy or fried foods are harder to digest and may make you feel nauseous or bloated.  Likewise meals heavy in dairy and vegetables can cause extra gas to form and this can also increase the bloating.  Drink plenty of fluids but you should avoid alcoholic beverages for 24 hours.  ACTIVITY: Your care partner should take you home directly after the procedure.  You should plan to take it easy, moving slowly for the rest of the day.  You can resume normal activity the day after the procedure however you should NOT DRIVE or use heavy machinery for 24 hours (because of the sedation medicines used during the test).    SYMPTOMS TO REPORT IMMEDIATELY: A gastroenterologist can be reached at any hour.  During normal business hours, 8:30 AM to 5:00 PM Monday through Friday,  call [phone].  After hours and on weekends, please call the GI answering service at [phone] who will take a message and have the physician on call contact you.   Following lower endoscopy (colonoscopy or flexible sigmoidoscopy):  Excessive amounts of blood in the stool  Significant tenderness or worsening of abdominal pains  Swelling of the abdomen that is new, acute  Fever of 100F or higher  Following upper endoscopy (EGD)  Vomiting of blood or coffee ground material  New chest pain or pain under the shoulder blades  Painful or persistently difficult swallowing  New shortness of breath  Fever of 100F or higher  Black, tarry-looking stools  FOLLOW UP: If any biopsies were taken you will be contacted by phone or by letter within the next 1-3 weeks.  Call your gastroenterologist if you have not heard about the biopsies in 3 weeks.  Our staff will call the home number listed on your records the next business day following your procedure to check on you and address any questions or concerns that you may have at that time regarding the information given to you following your procedure. This is a courtesy call and so if there is no answer at the home number and we have not heard from you through the emergency physician on call, we will assume that you have returned to your regular daily activities without incident.  SIGNATURES/CONFIDENTIALITY: You and/or your care   partner have signed paperwork which will be entered into your electronic medical record.  These signatures attest to the fact that that the information above on your After Visit Summary has been reviewed and is understood.  Full responsibility of the confidentiality of this discharge information lies with you and/or your care-partner.      Diverticulosis Diverticulosis is the condition that develops when small pouches (diverticula) form in the wall of your colon. Your colon, or large intestine, is where water is  absorbed and stool is formed. The pouches form when the inside layer of your colon pushes through weak spots in the outer layers of your colon. CAUSES  No one knows exactly what causes diverticulosis. RISK FACTORS  Being older than 22. Your risk for this condition increases with age. Diverticulosis is rare in people younger than 40 years. By age 4, almost everyone has it.  Eating a low-fiber diet.  Being frequently constipated.  Being overweight.  Not getting enough exercise.  Smoking.  Taking over-the-counter pain medicines, like aspirin and ibuprofen. SYMPTOMS  Most people with diverticulosis do not have symptoms. DIAGNOSIS  Because diverticulosis often has no symptoms, health care providers often discover the condition during an exam for other colon problems. In many cases, a health care provider will diagnose diverticulosis while using a flexible scope to examine the colon (colonoscopy). TREATMENT  If you have never developed an infection related to diverticulosis, you may not need treatment. If you have had an infection before, treatment may include:  Eating more fruits, vegetables, and grains.  Taking a fiber supplement.  Taking a live bacteria supplement (probiotic).  Taking medicine to relax your colon. HOME CARE INSTRUCTIONS   Drink at least 6-8 glasses of water each day to prevent constipation.  Try not to strain when you have a bowel movement.  Keep all follow-up appointments. If you have had an infection before:  Increase the fiber in your diet as directed by your health care provider or dietitian.  Take a dietary fiber supplement if your health care provider approves.  Only take medicines as directed by your health care provider. SEEK MEDICAL CARE IF:   You have abdominal pain.  You have bloating.  You have cramps.  You have not gone to the bathroom in 3 days. SEEK IMMEDIATE MEDICAL CARE IF:   Your pain gets worse.  Yourbloating becomes very  bad.  You have a fever or chills, and your symptoms suddenly get worse.  You begin vomiting.  You have bowel movements that are bloody or black. MAKE SURE YOU:  Understand these instructions.  Will watch your condition.  Will get help right away if you are not doing well or get worse. Document Released: 08/16/2004 Document Revised: 11/24/2013 Document Reviewed: 10/14/2013 [facility] Patient Information 2015 [place], [place]. This information is not intended to replace advice given to you by your health care provider. Make sure you discuss any questions you have with your health care provider.

## 2014-07-28 NOTE — Progress Notes (Signed)
Called to room to assist during endoscopic procedure.  Patient ID and intended procedure confirmed with present staff. Received instructions for my participation in the procedure from the performing physician.  

## 2014-07-29 ENCOUNTER — Telehealth: Payer: Self-pay | Admitting: *Deleted

## 2014-07-29 NOTE — Telephone Encounter (Signed)
  Follow up Call-  Call back number 07/28/2014 06/24/2013 07/02/2012  Post procedure Call Back phone  # [phone]  Permission to leave phone message Yes Yes Yes     Patient questions:  Do you have a fever, pain , or abdominal swelling? No. Pain Score  0 *  Have you tolerated food without any problems? Yes.    Have you been able to return to your normal activities? Yes.    Do you have any questions about your discharge instructions: Diet   No. Medications  No. Follow up visit  No.  Do you have questions or concerns about your Care? No.  Actions: * If pain score is 4 or above: No action needed, pain <4.

## 2014-08-03 ENCOUNTER — Encounter: Payer: Self-pay | Admitting: Internal Medicine

## 2014-08-04 ENCOUNTER — Encounter: Payer: Self-pay | Admitting: *Deleted

## 2014-08-10 ENCOUNTER — Telehealth: Payer: Self-pay | Admitting: Oncology

## 2014-08-10 NOTE — Telephone Encounter (Signed)
S/w pt to advise of time chg on 10/1 due to md on call. Gave pt new appt time of 9.30 on 10/1.

## 2014-08-12 ENCOUNTER — Encounter: Payer: Self-pay | Admitting: Internal Medicine

## 2014-09-01 ENCOUNTER — Other Ambulatory Visit: Payer: Medicare Other

## 2014-09-01 DIAGNOSIS — C189 Malignant neoplasm of colon, unspecified: Secondary | ICD-10-CM

## 2014-09-01 LAB — CEA: CEA: 1.9 ng/mL (ref 0.0–5.0)

## 2014-09-02 ENCOUNTER — Other Ambulatory Visit: Payer: Medicare Other

## 2014-09-02 ENCOUNTER — Telehealth: Payer: Self-pay | Admitting: Oncology

## 2014-09-02 ENCOUNTER — Ambulatory Visit (HOSPITAL_BASED_OUTPATIENT_CLINIC_OR_DEPARTMENT_OTHER): Payer: Medicare Other | Admitting: Oncology

## 2014-09-02 VITALS — BP 134/54 | HR 84 | Temp 98.3°F | Resp 18 | Ht 60.0 in | Wt 147.4 lb

## 2014-09-02 DIAGNOSIS — Z23 Encounter for immunization: Secondary | ICD-10-CM

## 2014-09-02 DIAGNOSIS — Z85038 Personal history of other malignant neoplasm of large intestine: Secondary | ICD-10-CM

## 2014-09-02 DIAGNOSIS — C189 Malignant neoplasm of colon, unspecified: Secondary | ICD-10-CM

## 2014-09-02 MED ORDER — INFLUENZA VAC SPLIT QUAD 0.5 ML IM SUSY
0.5000 mL | PREFILLED_SYRINGE | Freq: Once | INTRAMUSCULAR | Status: AC
Start: 2014-09-02 — End: 2014-09-02
  Administered 2014-09-02: 0.5 mL via INTRAMUSCULAR
  Filled 2014-09-02: qty 0.5

## 2014-09-02 NOTE — Telephone Encounter (Signed)
gv and printed appt sched and avs for pt fro April 2016

## 2014-09-02 NOTE — Progress Notes (Signed)
  [place] OFFICE PROGRESS NOTE   Diagnosis: Colon cancer  INTERVAL HISTORY:   She returns as scheduled. She underwent upper and lower endoscopy procedures 07/28/2014. She was found to have esophagitis at the GE junction and a hiatal hernia. The colonoscopy revealed mild sigmoid diverticulosis and small internal hemorrhoids. The pathology from a biopsy of the GE junction refill Barrett's esophagus. No dysplasia or malignancy identified.  She reports episodes of positional vertigo this week. She plans to see Dr. Inda Merlin.  Objective:  Vital signs in last 24 hours:  Blood pressure 134/54, pulse 84, temperature 98.3 F (36.8 C), temperature source Oral, resp. rate 18, height 5' (1.524 m), weight 147 lb 6.4 oz (66.86 kg).    HEENT: Neck without mass Lymphatics: No cervical, supraclavicular, axillary, or inguinal nodes Resp: Lungs clear bilaterally Cardio: Regular rate and rhythm GI: No hepatomegaly, nontender, no mass Vascular: No leg edema   Lab Results:    Lab Results  Component Value Date   CEA 1.9 09/01/2014    Medications: I have reviewed the patient's current medications.  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 Preoperative anemia, July 2013  4. Family history of colon/pancreas and lung cancer  5. Family history of pulmonary fibrosis  6. 4 mm subpleural nodule in the lateral basal segment of the left lower lobe on a CT 11/05/2013 at [facility]   7. gastroesophageal reflux disease, biopsy 07/28/2014 at the [place] junction confirmed Barrett's esophagus   Disposition:  She remains in clinical remission from colon cancer. She will return for an office visit and CEA in 6 months. She will see Dr. Inda Merlin to evaluate the vertigo symptoms. We administered an influenza vaccine today.  Betsy Coder, MD  09/02/2014  9:57 AM

## 2014-12-15 NOTE — Telephone Encounter (Signed)
erroneous

## 2015-01-13 ENCOUNTER — Other Ambulatory Visit: Payer: Self-pay | Admitting: Family Medicine

## 2015-01-13 ENCOUNTER — Ambulatory Visit
Admission: RE | Admit: 2015-01-13 | Discharge: 2015-01-13 | Disposition: A | Discharge status: Home / Self Care | Payer: Medicare HMO | Source: Ambulatory Visit | Attending: Family Medicine | Admitting: Family Medicine

## 2015-01-13 DIAGNOSIS — M25551 Pain in right hip: Secondary | ICD-10-CM

## 2015-02-07 ENCOUNTER — Other Ambulatory Visit: Payer: Self-pay

## 2015-02-07 ENCOUNTER — Telehealth: Payer: Self-pay | Admitting: Oncology

## 2015-02-07 DIAGNOSIS — Z1231 Encounter for screening mammogram for malignant neoplasm of breast: Secondary | ICD-10-CM

## 2015-02-07 NOTE — Telephone Encounter (Signed)
Pt called to confirm labs/ov, called pt back lft msg D/T for labs/ov and mailed out schedule... KJ

## 2015-02-23 ENCOUNTER — Ambulatory Visit
Admission: RE | Admit: 2015-02-23 | Discharge: 2015-02-23 | Disposition: A | Discharge status: Home / Self Care | Payer: Medicare PPO | Source: Ambulatory Visit

## 2015-02-23 DIAGNOSIS — Z1231 Encounter for screening mammogram for malignant neoplasm of breast: Secondary | ICD-10-CM

## 2015-03-04 DIAGNOSIS — E0865 Diabetes mellitus due to underlying condition with hyperglycemia: Secondary | ICD-10-CM | POA: Diagnosis not present

## 2015-03-04 DIAGNOSIS — H2511 Age-related nuclear cataract, right eye: Secondary | ICD-10-CM | POA: Diagnosis not present

## 2015-03-04 DIAGNOSIS — H04223 Epiphora due to insufficient drainage, bilateral lacrimal glands: Secondary | ICD-10-CM | POA: Diagnosis not present

## 2015-03-04 DIAGNOSIS — H43811 Vitreous degeneration, right eye: Secondary | ICD-10-CM | POA: Diagnosis not present

## 2015-03-07 ENCOUNTER — Telehealth: Payer: Self-pay | Admitting: Oncology

## 2015-03-07 NOTE — Telephone Encounter (Signed)
Per provider schedule r/s MD visit to 04/19 sent msg through my chart and put copy of schedule up front for pt to p/u at next visit.... Linda Grant

## 2015-03-08 ENCOUNTER — Other Ambulatory Visit: Payer: Medicare PPO

## 2015-03-08 DIAGNOSIS — C189 Malignant neoplasm of colon, unspecified: Secondary | ICD-10-CM

## 2015-03-09 LAB — CEA: CEA: 2 ng/mL (ref 0.0–5.0)

## 2015-03-10 ENCOUNTER — Ambulatory Visit: Payer: Medicare PPO | Admitting: Oncology

## 2015-03-16 ENCOUNTER — Other Ambulatory Visit: Payer: Self-pay | Admitting: *Deleted

## 2015-03-21 ENCOUNTER — Telehealth: Payer: Self-pay | Admitting: Oncology

## 2015-03-21 NOTE — Telephone Encounter (Signed)
Returning voicemail needing to change appointment. Left message to change appointment.

## 2015-03-22 ENCOUNTER — Ambulatory Visit: Payer: Medicare PPO | Admitting: Oncology

## 2015-03-23 ENCOUNTER — Ambulatory Visit: Payer: Medicare PPO | Admitting: Oncology

## 2015-03-24 ENCOUNTER — Ambulatory Visit (HOSPITAL_BASED_OUTPATIENT_CLINIC_OR_DEPARTMENT_OTHER): Payer: Medicare PPO | Admitting: Oncology

## 2015-03-24 ENCOUNTER — Telehealth: Payer: Self-pay | Admitting: Oncology

## 2015-03-24 VITALS — BP 122/64 | HR 100 | Temp 98.1°F | Resp 18 | Ht 60.0 in | Wt 151.3 lb

## 2015-03-24 DIAGNOSIS — Z801 Family history of malignant neoplasm of trachea, bronchus and lung: Secondary | ICD-10-CM | POA: Diagnosis not present

## 2015-03-24 DIAGNOSIS — Z800 Family history of malignant neoplasm of digestive organs: Secondary | ICD-10-CM | POA: Diagnosis not present

## 2015-03-24 DIAGNOSIS — Z85038 Personal history of other malignant neoplasm of large intestine: Secondary | ICD-10-CM | POA: Diagnosis not present

## 2015-03-24 DIAGNOSIS — C189 Malignant neoplasm of colon, unspecified: Secondary | ICD-10-CM

## 2015-03-24 NOTE — Telephone Encounter (Signed)
Gave avs & calendar for October. °

## 2015-03-24 NOTE — Progress Notes (Signed)
  [place] OFFICE PROGRESS NOTE   Diagnosis: Colon cancer  INTERVAL HISTORY:   Ms. [name] returns as scheduled. She feels well. No new complaint. Multiple family members have been diagnosed with pulmonary fibrosis and/or followed in a study at [facility].  Objective:  Vital signs in last 24 hours:  Blood pressure 122/64, pulse 100, temperature 98.1 F (36.7 C), temperature source Oral, resp. rate 18, height 5' (1.524 m), weight 151 lb 4.8 oz (68.629 kg), SpO2 100 %.    HEENT: Neck without mass Lymphatics: No cervical, supraclavicular, axillary, or inguinal nodes Resp: Lungs clear bilaterally Cardio: Regular rate and rhythm GI: No hepatosplenomegaly, nontender, no mass Vascular: No leg edema  Lab Results:   Lab Results  Component Value Date   CEA 2.0 03/08/2015    Medications: I have reviewed the patient's current medications.  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 Preoperative anemia, July 2013  4. Family history of colon/pancreas and lung cancer  5. Family history of pulmonary fibrosis  6. 4 mm subpleural nodule in the lateral basal segment of the left lower lobe on a CT 11/05/2013 at [facility]  7. gastroesophageal reflux disease, biopsy 07/28/2014 at the [place] junction confirmed Barrett's esophagus    Disposition:  Linda Grant remains in clinical remission from colon cancer. She will return for an office visit and CEA in 6 months.  She continues surveillance colonoscopies and upper endoscopies with Dr. Olevia Perches.  Betsy Coder, MD  03/24/2015  3:10 PM

## 2015-05-30 ENCOUNTER — Other Ambulatory Visit: Payer: Self-pay

## 2015-08-03 DIAGNOSIS — I10 Essential (primary) hypertension: Secondary | ICD-10-CM | POA: Diagnosis not present

## 2015-08-03 DIAGNOSIS — Z23 Encounter for immunization: Secondary | ICD-10-CM | POA: Diagnosis not present

## 2015-08-03 DIAGNOSIS — E780 Pure hypercholesterolemia: Secondary | ICD-10-CM | POA: Diagnosis not present

## 2015-08-03 DIAGNOSIS — M8580 Other specified disorders of bone density and structure, unspecified site: Secondary | ICD-10-CM | POA: Diagnosis not present

## 2015-08-03 DIAGNOSIS — E119 Type 2 diabetes mellitus without complications: Secondary | ICD-10-CM | POA: Diagnosis not present

## 2015-08-03 DIAGNOSIS — Z85038 Personal history of other malignant neoplasm of large intestine: Secondary | ICD-10-CM | POA: Diagnosis not present

## 2015-08-03 DIAGNOSIS — K219 Gastro-esophageal reflux disease without esophagitis: Secondary | ICD-10-CM | POA: Diagnosis not present

## 2015-08-03 DIAGNOSIS — Z0000 Encounter for general adult medical examination without abnormal findings: Secondary | ICD-10-CM | POA: Diagnosis not present

## 2015-09-01 DIAGNOSIS — M859 Disorder of bone density and structure, unspecified: Secondary | ICD-10-CM | POA: Diagnosis not present

## 2015-09-01 DIAGNOSIS — M8589 Other specified disorders of bone density and structure, multiple sites: Secondary | ICD-10-CM | POA: Diagnosis not present

## 2015-09-20 ENCOUNTER — Other Ambulatory Visit (HOSPITAL_BASED_OUTPATIENT_CLINIC_OR_DEPARTMENT_OTHER): Payer: Medicare PPO

## 2015-09-20 DIAGNOSIS — Z85038 Personal history of other malignant neoplasm of large intestine: Secondary | ICD-10-CM

## 2015-09-20 DIAGNOSIS — C189 Malignant neoplasm of colon, unspecified: Secondary | ICD-10-CM | POA: Diagnosis not present

## 2015-09-21 LAB — CEA: CEA: 1.8 ng/mL (ref 0.0–5.0)

## 2015-09-22 ENCOUNTER — Telehealth: Payer: Self-pay | Admitting: Oncology

## 2015-09-22 ENCOUNTER — Ambulatory Visit (HOSPITAL_BASED_OUTPATIENT_CLINIC_OR_DEPARTMENT_OTHER): Payer: Medicare PPO | Admitting: Oncology

## 2015-09-22 VITALS — BP 136/76 | HR 97 | Temp 98.7°F | Resp 18 | Ht 60.0 in | Wt 153.4 lb

## 2015-09-22 DIAGNOSIS — Z85038 Personal history of other malignant neoplasm of large intestine: Secondary | ICD-10-CM | POA: Diagnosis not present

## 2015-09-22 DIAGNOSIS — Z8049 Family history of malignant neoplasm of other genital organs: Secondary | ICD-10-CM | POA: Diagnosis not present

## 2015-09-22 DIAGNOSIS — C182 Malignant neoplasm of ascending colon: Secondary | ICD-10-CM

## 2015-09-22 DIAGNOSIS — R918 Other nonspecific abnormal finding of lung field: Secondary | ICD-10-CM

## 2015-09-22 DIAGNOSIS — Z801 Family history of malignant neoplasm of trachea, bronchus and lung: Secondary | ICD-10-CM

## 2015-09-22 NOTE — Telephone Encounter (Signed)
per pof to sch pt appt-gave pt copy of avs °

## 2015-09-22 NOTE — Progress Notes (Signed)
  [place] OFFICE PROGRESS NOTE   Diagnosis: Colon cancer  INTERVAL HISTORY:   Linda Grant returns as scheduled. She feels well. Good appetite and energy level. No difficulty with bowel function.  Objective:  Vital signs in last 24 hours:  Blood pressure 136/76, pulse 97, temperature 98.7 F (37.1 C), temperature source Oral, resp. rate 18, height 5' (1.524 m), weight 153 lb 6.4 oz (69.582 kg), SpO2 97 %.    HEENT: Neck without mass Lymphatics: No cervical, supra-clavicular, axillary, or inguinal nodes Resp: Lungs clear bilaterally Cardio: Regular rate and rhythm GI: No hepatomegaly, no mass Vascular: No leg edema  Lab Results:    Lab Results  Component Value Date   CEA 1.8 09/20/2015   Medications: I have reviewed the patient's current medications.  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 Preoperative anemia, July 2013  4. Family history of colon/pancreas and lung cancer  5. Family history of pulmonary fibrosis  6. 4 mm subpleural nodule in the lateral basal segment of the left lower lobe on a CT 11/05/2013 at [facility]  7. gastroesophageal reflux disease, biopsy 07/28/2014 at the [place] junction confirmed Barrett's esophagus     Disposition:  Linda Grant remains in clinical remission from colon cancer. She has an extensive family history of cancer including a cousin with uterine cancer and a cousin with small bowel cancer. I will  contact the genetics counselor to discuss the indication for additional genetic testing. Linda Grant will return for an office visit and CEA in 6 months.  Betsy Coder, MD  09/22/2015  10:43 AM

## 2016-03-02 ENCOUNTER — Other Ambulatory Visit: Payer: Self-pay

## 2016-03-02 DIAGNOSIS — Z1231 Encounter for screening mammogram for malignant neoplasm of breast: Secondary | ICD-10-CM

## 2016-03-20 ENCOUNTER — Other Ambulatory Visit (HOSPITAL_BASED_OUTPATIENT_CLINIC_OR_DEPARTMENT_OTHER): Payer: Medicare Other

## 2016-03-20 DIAGNOSIS — C182 Malignant neoplasm of ascending colon: Secondary | ICD-10-CM

## 2016-03-21 ENCOUNTER — Ambulatory Visit
Admission: RE | Admit: 2016-03-21 | Discharge: 2016-03-21 | Disposition: A | Discharge status: Home / Self Care | Payer: Medicare Other | Source: Ambulatory Visit

## 2016-03-21 DIAGNOSIS — Z1231 Encounter for screening mammogram for malignant neoplasm of breast: Secondary | ICD-10-CM

## 2016-03-21 LAB — CEA (PARALLEL TESTING): CEA: 2.2 ng/mL

## 2016-03-21 LAB — CEA: CEA: 3.3 ng/mL (ref 0.0–4.7)

## 2016-03-22 ENCOUNTER — Ambulatory Visit (HOSPITAL_BASED_OUTPATIENT_CLINIC_OR_DEPARTMENT_OTHER): Payer: Medicare Other | Admitting: Oncology

## 2016-03-22 ENCOUNTER — Telehealth: Payer: Self-pay | Admitting: Oncology

## 2016-03-22 VITALS — BP 132/63 | HR 92 | Temp 97.8°F | Resp 18 | Ht 60.0 in | Wt 147.8 lb

## 2016-03-22 DIAGNOSIS — Z800 Family history of malignant neoplasm of digestive organs: Secondary | ICD-10-CM

## 2016-03-22 DIAGNOSIS — Z801 Family history of malignant neoplasm of trachea, bronchus and lung: Secondary | ICD-10-CM

## 2016-03-22 DIAGNOSIS — C182 Malignant neoplasm of ascending colon: Secondary | ICD-10-CM

## 2016-03-22 DIAGNOSIS — Z85038 Personal history of other malignant neoplasm of large intestine: Secondary | ICD-10-CM | POA: Diagnosis not present

## 2016-03-22 NOTE — Telephone Encounter (Signed)
Gave and printed appt sched and avs fo rpt for OCT °

## 2016-03-22 NOTE — Progress Notes (Signed)
  [place] OFFICE PROGRESS NOTE   Diagnosis: Colon cancer  INTERVAL HISTORY:   Ms. Laske returns as scheduled. She reports intentional weight loss. She has noted a few episodes of blood on the toilet paper recently. She does not have bleeding with each bowel movement. She complains of increased "reflux "symptoms.  Objective:  Vital signs in last 24 hours:  Blood pressure 132/63, pulse 92, temperature 97.8 F (36.6 C), temperature source Oral, resp. rate 18, height 5' (1.524 m), weight 147 lb 12.8 oz (67.042 kg), SpO2 100 %.    HEENT: Neck without mass Lymphatics: No cervical, supraclavicular, axillary, or inguinal nodes Resp: Lungs clear bilaterally Cardio: Regular rate and rhythm GI: No hepatosplenomegaly, no mass, nontender Vascular: No leg edema   Lab Results:    Lab Results  Component Value Date   CEA1 3.3 03/20/2016    Imaging:  Mm Screening Breast Tomo Bilateral  03/22/2016  CLINICAL DATA:  Screening. EXAM: 2D DIGITAL SCREENING BILATERAL MAMMOGRAM WITH CAD AND ADJUNCT TOMO COMPARISON:  Previous exam(s). ACR Breast Density Category c: The breast tissue is heterogeneously dense, which may obscure small masses. FINDINGS: There are no findings suspicious for malignancy. Images were processed with CAD. IMPRESSION: No mammographic evidence of malignancy. A result letter of this screening mammogram will be mailed directly to the patient. RECOMMENDATION: Screening mammogram in one year. (Code:[id number]-01Y) BI-RADS CATEGORY  1: Negative. Electronically Signed   By: Dorise Bullion III M.D   On: 03/22/2016 07:28    Medications: I have reviewed the patient's current medications.  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 Preoperative anemia, July 2013  4. Family history of colon/pancreas and lung cancer  5. Family history of pulmonary fibrosis  6. 4 mm subpleural nodule in the lateral basal segment of the left lower lobe on a CT 11/05/2013 at [facility]  7. gastroesophageal reflux disease, biopsy 07/28/2014 at the GE junction confirmed Barrett's esophagus 8. Diabetes 9. Report of rectal bleeding-declined rectal examination today, we'll refer to GI   Disposition:  Ms. Rosado remains in clinical remission from colon cancer. She would like to continue every 6 month follow-up at the [facility]. She will return for an office visit and CEA in 6 months.  I will refer her to Dr. Hilarie Fredrickson for follow-up of the Barrett's esophagus and a surveillance colonoscopy. She will seek medical attention if the rectal bleeding persists.  Betsy Coder, MD  03/22/2016  12:45 PM

## 2016-06-15 ENCOUNTER — Encounter: Payer: Self-pay | Admitting: Genetic Counselor

## 2016-06-27 ENCOUNTER — Telehealth: Payer: Self-pay | Admitting: Genetic Counselor

## 2016-06-27 NOTE — Telephone Encounter (Signed)
Returned pt's call regarding scheduling genetics appt.

## 2016-07-30 ENCOUNTER — Encounter: Payer: Self-pay | Admitting: Genetic Counselor

## 2016-07-31 ENCOUNTER — Other Ambulatory Visit: Payer: Medicare Other

## 2016-07-31 ENCOUNTER — Ambulatory Visit (HOSPITAL_BASED_OUTPATIENT_CLINIC_OR_DEPARTMENT_OTHER): Payer: Medicare Other | Admitting: Genetic Counselor

## 2016-07-31 DIAGNOSIS — Z85038 Personal history of other malignant neoplasm of large intestine: Secondary | ICD-10-CM

## 2016-07-31 DIAGNOSIS — Z8052 Family history of malignant neoplasm of bladder: Secondary | ICD-10-CM

## 2016-07-31 DIAGNOSIS — Z315 Encounter for genetic counseling: Secondary | ICD-10-CM

## 2016-07-31 DIAGNOSIS — Z803 Family history of malignant neoplasm of breast: Secondary | ICD-10-CM | POA: Diagnosis not present

## 2016-07-31 DIAGNOSIS — Z800 Family history of malignant neoplasm of digestive organs: Secondary | ICD-10-CM

## 2016-07-31 DIAGNOSIS — Z801 Family history of malignant neoplasm of trachea, bronchus and lung: Secondary | ICD-10-CM

## 2016-07-31 DIAGNOSIS — Z8371 Family history of colonic polyps: Secondary | ICD-10-CM

## 2016-07-31 DIAGNOSIS — Z808 Family history of malignant neoplasm of other organs or systems: Secondary | ICD-10-CM

## 2016-07-31 DIAGNOSIS — Z8042 Family history of malignant neoplasm of prostate: Secondary | ICD-10-CM

## 2016-07-31 DIAGNOSIS — Z83719 Family history of colon polyps, unspecified: Secondary | ICD-10-CM

## 2016-08-01 ENCOUNTER — Encounter: Payer: Self-pay | Admitting: Genetic Counselor

## 2016-08-01 NOTE — Progress Notes (Signed)
REFERRING PROVIDER: Betsy Coder, MD  PRIMARY PROVIDER:  Marjorie Smolder, MD  PRIMARY REASON FOR VISIT:  1. History of colon cancer   2. Family history of colon cancer   3. Family history of breast cancer in female   28. Family history of bladder cancer   5. Family history of prostate cancer   6. Family history of melanoma   7. Family history of lung cancer   8. Family history of colonic polyps      HISTORY OF PRESENT ILLNESS:   Linda Grant, a 75 y.o. female, was seen for a [place] cancer genetics consultation due to a personal history of colon cancer and family history of colon, breast, and other cancers. Linda Grant was previously seen in [facility] in 2013 at which time she had negative PMS2 and MLH1 genetic testing (ordered due to her abnormal tumor testing results).  Linda Grant received our letter regarding updated genetic testing options; she is interested in finding out more information about this history of cancer.   Linda Grant presents to clinic today to discuss the possibility of a hereditary predisposition to cancer, genetic testing, and to further clarify her future cancer risks, as well as potential cancer risks for family members.   In August 2013, at the age of 59, Linda Grant was diagnosed with adenocarcinoma of the cecum.  Microsatellite instability testing demonstrated high instability and immunohistochemistry testing demonstrated loss of PMS2 at first, followed by an updated report that demonstrated loss of PMS2 and MLH1 proteins.  Linda Grant also had BRAF+ testing.  This was treated with right colectomy.  HORMONAL RISK FACTORS:  Menarche was at age 22.  First live birth at age - no children.  OCP use for approximately - pills for approx 15-20 years; IUD for approx 10-12 years Ovaries intact: yes.  Hysterectomy: yes, at age 21 for issues with heavy bleeding.  Menopausal status: postmenopausal.  HRT use: 7-8 years. Colonoscopy: yes; Colonoscopies in 2003 and 2008  found approx 6 hyperplastic polyps; colonoscopy in 2013 found 2 tubular adenomas at the time of colon cancer finding; normal colonoscopies in 2014 and 2015 Mammogram within the last year: yes; history of cyst aspiration Number of breast biopsies: 0. Up to date with pelvic exams:  yes. Any excessive radiation exposure/other in the past:  no, reports some additional history of secondhand smoke exposure  Past Medical History:  Diagnosis Date  . Adenocarcinoma of cecum ([place]) 07/02/12  . Anemia   . Arthritis   . Blood in stool   . Colon cancer ([place])   . Diabetes mellitus ([place])    type 2 - controlled  . Diverticulosis   . Elevated cholesterol   . GERD (gastroesophageal reflux disease)   . Heart murmur   . Hepatic lesion   . Hx of adenomatous colonic polyps 07/02/12  . Hyperplastic colon polyp   . Hypertension   . Internal hemorrhoids   . Mitral valve prolapse   . Rectal bleeding   . Ventral hernia   . Vitamin D deficiency     Past Surgical History:  Procedure Laterality Date  . COLON SURGERY    . COLONOSCOPY    . [place]   Dental Implants  . MOUTH SURGERY     dental implants  . PARTIAL COLECTOMY  07/28/2012  . VAGINAL HYSTERECTOMY      Social History   Social History  . Marital status: Married    Spouse name: N/A  . Number of  children: N/A  . Years of education: N/A   Occupational History  . retired    Social History Main Topics  . Smoking status: Former Smoker    Packs/day: 0.20    Types: Cigarettes    Quit date: 07/10/1982  . Smokeless tobacco: Never Used     Comment: social smoker - only 3-4 cigs in a day and not everyday  . Alcohol use 1.2 - 1.8 oz/week    2 - 3 Glasses of wine per week     Comment: glass of wine w/ dinner -- 2-3x per week  . Drug use: No  . Sexual activity: Not Currently   Other Topics Concern  . None   Social History Narrative   Retired Tourist information centre manager   No biological children   #3 children she raised and husband has #3  children from his previous marriage     FAMILY HISTORY:  We obtained a detailed, 4-generation family history.  Significant diagnoses are listed below: Family History  Problem Relation Age of Onset  . Lung cancer Sister 19    smoker  . Diabetes Brother     deceased  . COPD Brother     d. 30  . Diabetes Brother 43  . Heart disease Father   . Pulmonary fibrosis Father     d. 54  . Colon cancer Sister 13    w/ mets to pancreas at 40y; negative genetic testing (Myriad myRisk in 2014)  . Pulmonary fibrosis Sister     d. 36  . Colon polyps Sister     "benign"; unspecified number  . Breast cancer Maternal Aunt     older than 50y  . Uterine cancer Cousin 18    maternal 1st cousin, once-removed  . Breast cancer Cousin     maternal 1st cousin  . Prostate cancer Cousin     first cousin (he is a maternal and paternal 1st cousin)  . Bladder Cancer Cousin     bladder cancer w/ intestinal involvement; dx 40s-50s  . Pulmonary fibrosis Paternal Uncle   . Colon polyps Other     niece w/ "several polyps"; dx <50y  . Heart attack Maternal Uncle     d. late 50s-early 60s  . Heart attack Maternal Grandmother 89  . Heart attack Maternal Grandfather   . Heart attack Paternal Grandmother     d. 70s-80s  . Heart attack Paternal Grandfather     d. late 70s-early 80s  . Breast cancer Maternal Aunt     dx late 26s  . Diabetes Maternal Aunt   . Heart Problems Maternal Aunt   . Diabetes Maternal Aunt   . Heart Problems Maternal Aunt   . Diabetes Maternal Aunt   . Heart Problems Maternal Aunt   . Heart attack Maternal Uncle   . Heart attack Maternal Uncle   . Melanoma Paternal Uncle   . Leukemia Cousin     paternal 1st cousin, once-removed dx 54s  . Esophageal cancer Neg Hx   . Rectal cancer Neg Hx   . Stomach cancer Neg Hx     Linda Grant has two full brothers and two full sisters.  One brother is living--currently 28 years of age--and has never had cancer.  One brother died of COPD at  73.  Both of her sisters have passed away.  One sister, a smoker, died of lung cancer at 11.  One sister was diagnosed with colon cancer (also of the cecum) at age 7.  This  metastasized to her pancreas at the age of 48.  This sister ultimately passed away from pulmonary fibrosis at the age of 70.  Linda Grant reports that this sister also has a history of additional benign colon polyps, and she had negative Myriad myRisk testing in 2014.  This sister has two daughters, one of whom has a history of "several polyps" diagnosed at age 68 or younger.    Linda Grant mother died at 48 and never had cancer.  She had five full sisters and three full brothers.  Two sisters are still living--both have a history of breast cancer, one diagnosed older than age 66, the other diagnosed in her late 57s.  The former has a granddaughter who was diagnosed with uterine cancer at the age of 30.  The latter has three daughters, one of whom has a history of breast cancer.  Linda Grant maternal uncles mostly passed away from heart attacks.  Three aunts passed away at later ages, due to diabetes and heart problems.  Linda Grant maternal grandmother died of a heart attack at 35.  Her sister married the brother or Linda Grant's father--thus, Linda Grant has a maternal first cousin, once-removed who is also her paternal first cousin.  This cousin has a history of prostate cancer.  Linda Grant maternal grandfather died of a heart attack when she was only 75 years old.  She has no further information for any maternal great aunts/uncles and great grandparents.  Linda Grant father died of pulmonary fibrosis at 64.  He had five full brothers and three full sisters.  His sisters all passed away mostly later ages, due to age-related causes, with no history of cancer.  One brother had a history of melanoma and passed away in his 23s.  He had one son and one daughter; his sone died of a bladder cancer with intestinal involvement in his 41s-50s.  His  daughter had a daughter who died of leukemia in her 38s.  Many of Linda Grant's paternal uncles died of pulmonary fibrosis.  Her paternal grandmother died of a heart attack in her 9s-80s, as did her paternal grandfather.  She has no information for any paternal great aunts/uncles or great grandparents.    Patient's maternal ancestors are of Scotch-Irish descent, and paternal ancestors are of French/Native American descent. There is no reported Ashkenazi Jewish ancestry. There is no known consanguinity.  GENETIC COUNSELING ASSESSMENT: Linda Grant is a 75 y.o. female with a personal and family history of cancer which is somewhat suggestive of a hereditary cancer syndrome and predisposition to cancer. We, therefore, discussed and recommended the following at today's visit.   DISCUSSION: We reviewed the characteristics, features and inheritance patterns of hereditary cancer syndromes, particularly those caused by mutations within the Lynch syndrome, APC, MUTYH, and BRCA1/2 genes. We also discussed genetic testing, including the appropriate family members to test, the process of testing, insurance coverage and turn-around-time for results. We discussed the implications of a negative, positive and/or variant of uncertain significant result. We recommended Ms. Tindel pursue genetic testing for the 32-gene Comprehensive Cancer Panel with MSH2 Exons 1-7 Inversion Analysis through [facility] [employer] [facility].  The Comprehensive Cancer Panel offered by [facility] includes sequencing and/or deletion duplication testing of the following 32 genes: APC, ATM, AXIN2, BARD1, BMPR1A, BRCA1, BRCA2, BRIP1, CDH1, CDK4, CDKN2A, CHEK2, EPCAM, FANCC, MLH1, MSH2, MSH6, MUTYH, NBN, PALB2, PMS2, POLD1, POLE, PTEN, RAD51C, RAD51D, SCG5/GREM1, SMAD4, STK11, TP53, VHL, and XRCC2.     Based on Ms. Amalfitano's personal  and family history of cancer, she meets medical criteria for genetic testing. Despite that she meets criteria, she may still have an  out of pocket cost. We discussed that if her out of pocket cost for testing is over $100, the laboratory will call and confirm whether she wants to proceed with testing.  If the out of pocket cost of testing is less than $100 she will be billed by the genetic testing laboratory.   PLAN: After considering the risks, benefits, and limitations, Ms. Donovan  provided informed consent to pursue genetic testing and the blood sample was sent to [facility] for analysis of the 32-gene Comprehensive Cancer Panel with MSH2 Exons 1-7 Inversion Analysis. Results should be available within approximately 2-3 weeks' time, at which point they will be disclosed by telephone to Ms. Stenglein, as will any additional recommendations warranted by these results. Ms. Leist will receive a summary of her genetic counseling visit and a copy of her results once available. This information will also be available in Epic. We encouraged Ms. Delisle to remain in contact with cancer genetics annually so that we can continuously update the family history and inform her of any changes in cancer genetics and testing that may be of benefit for her family. Ms. Wareing questions were answered to her satisfaction today. Our contact information was provided should additional questions or concerns arise.  Thank you for the referral and allowing [place] to share in the care of your patient.   Jeanine Luz, MS, [facility] Certified Genetic Counselor [email or web address] Phone: [phone]  The patient was seen for a total of 60 minutes in face-to-face genetic counseling.  This patient was discussed with Drs. Magrinat, Lindi Adie and/or Burr Medico who agrees with the above.    _______________________________________________________________________ For Office Staff:  Number of people involved in session: 1 Was an Intern/ student involved with case: no

## 2016-08-02 ENCOUNTER — Telehealth: Payer: Self-pay | Admitting: Internal Medicine

## 2016-08-02 NOTE — Telephone Encounter (Signed)
OK if she is willing to wait to see me - she had some minor rectal bleeding and in April looks like Dr. Benay Spice suggested Dr. Hilarie Fredrickson.  I think Dr. Havery Moros, Pyrtle or me would be good in case she needs hemorrhoid banding.

## 2016-08-20 ENCOUNTER — Telehealth: Payer: Self-pay | Admitting: Genetic Counselor

## 2016-08-20 NOTE — Telephone Encounter (Signed)
Discussed with Linda Grant that her genetic test result was negative for known pathogenic mutations within any of 32 genes on the Comprehensive Cancer Panel through [facility].  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  Discussed that we treat this like a negative test result until it gets updated by the lab and reviewed why we do that.  Encouraged Linda Grant to keep her phone number up-to-date with [place] so that we can call and update her if/when this result gets updated.  Encouraged verifying her sister's previous genetic test result to ensure that she had a negative test result and to check on whether or not she also had this MSH6 VUS on her test.  Also encouraged further genetic testing of maternal aunts and cousins who have been diagnosed with breast or uterine cancers.  Linda Grant will discuss this with her relatives.  Linda Grant should continue to follow her doctors' recommendations for future cancer screening.  She is welcome to call or email with any questions.  She would like an emailed copy of her results and a results letter, and I am happy to provide that.

## 2016-08-21 ENCOUNTER — Ambulatory Visit: Payer: Self-pay | Admitting: Genetic Counselor

## 2016-08-21 DIAGNOSIS — Z1379 Encounter for other screening for genetic and chromosomal anomalies: Secondary | ICD-10-CM

## 2016-08-21 DIAGNOSIS — Z8049 Family history of malignant neoplasm of other genital organs: Secondary | ICD-10-CM

## 2016-08-21 DIAGNOSIS — Z800 Family history of malignant neoplasm of digestive organs: Secondary | ICD-10-CM

## 2016-08-21 DIAGNOSIS — Z85038 Personal history of other malignant neoplasm of large intestine: Secondary | ICD-10-CM

## 2016-08-21 DIAGNOSIS — Z809 Family history of malignant neoplasm, unspecified: Secondary | ICD-10-CM

## 2016-08-21 DIAGNOSIS — Z803 Family history of malignant neoplasm of breast: Secondary | ICD-10-CM

## 2016-08-22 ENCOUNTER — Ambulatory Visit: Payer: Self-pay | Admitting: Surgery

## 2016-08-22 ENCOUNTER — Telehealth: Payer: Self-pay | Admitting: Internal Medicine

## 2016-08-22 NOTE — H&P (Signed)
Linda Grant 08/22/2016 10:31 AM Location: [facility] Patient #: [id number] DOB: 02-17-1941 Married / Language: Cleophus Molt / Race: White Female  History of Present Illness Marcello Moores A. Zayleigh Stroh MD; 08/22/2016 10:59 AM) Patient words: hernia    Patient returns for follow-up after developing an incisional hernia. She underwent laparoscopic-assisted colectomy 4 years ago for colon cancer. She has been cancer free. Over the last 6 months she developed a bulge about her laparoscopic extraction site just above her umbilicus. She is having mild discomfort. She is having no nausea or vomiting. Hernia is reducible. She is due for upper and lower endoscopy secondary to a history of Barrett's esophagus and history of colon cancer. She is in the process of meeting her new gastroenterologist. She denies any history of blood per stool, constipation, diarrhea, or change in bowel habits.  The patient is a 75 year old female.   Problem List/Past Medical Ventura Sellers, [place]; 08/22/2016 10:32 AM) HISTORY OF COLON CANCER (Z85.038)  Other Problems Ventura Sellers, CMA; 08/22/2016 10:32 AM) Arthritis Colon Cancer Diabetes Mellitus Gastroesophageal Reflux Disease Heart murmur Hemorrhoids High blood pressure Hypercholesterolemia Kidney Stone  Past Surgical History Ventura Sellers, [place]; 08/22/2016 10:32 AM) Colon Polyp Removal - Colonoscopy Colon Removal - Partial Hysterectomy (not due to cancer) - Partial Oral Surgery  Diagnostic Studies History Ventura Sellers, [place]; 08/22/2016 10:32 AM) Mammogram within last year  Allergies Ventura Sellers, CMA; 08/22/2016 10:32 AM) Penicillin G Pot in Dextrose *PENICILLINS*  Medication History Ventura Sellers, CMA; 08/22/2016 10:34 AM) Lisinopril (20MG  Tablet, Oral) Active. Lovastatin (40MG  Tablet, Oral) Active. MetFORMIN HCl ER (500MG  Tablet ER 24HR, Oral) Active. Omeprazole (20MG  Capsule DR, Oral)  Active. Aspirin (81MG  Tablet, Oral) Active. Vitamin D (Oral) Specific dose unknown - Active. Glucosamine Chondr 500 Complex (Oral) Specific dose unknown - Active. Medications Reconciled  Social History Ventura Sellers, [place]; 08/22/2016 10:34 AM) Alcohol use Occasional alcohol use. Caffeine use Coffee. No drug use Tobacco use Former smoker.  Family History Ventura Sellers, [place]; 08/22/2016 10:34 AM) Arthritis Brother, Mother, Sister. Colon Cancer Sister. Colon Polyps Brother, Sister. Depression Brother. Diabetes Mellitus Brother. Heart Disease Brother, Father. Heart disease in female family member before age 33 Hypertension Brother, Father, Mother, Sister. Malignant Neoplasm Of Pancreas Sister. Migraine Headache Sister. Respiratory Condition Brother, Father, Sister.  Pregnancy / Birth History Ventura Sellers, [place]; 08/22/2016 10:34 AM) Age at menarche 39 years. Age of menopause 65-50 Contraceptive History Oral contraceptives. Gravida 0 Para 0    Vitals (Michelle R. Brooks CMA; 08/22/2016 10:32 AM) 08/22/2016 10:31 AM Weight: 144.38 lb Height: 60.25in Body Surface Area: 1.63 m Body Mass Index: 27.96 kg/m  BP: 126/80 (Sitting, Left Arm, Standard)      Physical Exam (Azari Janssens A. Jani Ploeger MD; 08/22/2016 11:01 AM)  General Mental Status-Alert. General Appearance-Consistent with stated age. Hydration-Well hydrated. Voice-Normal.  Head and Neck Head-normocephalic, atraumatic with no lesions or palpable masses. Trachea-midline. Thyroid Gland Characteristics - normal size and consistency.  Chest and Lung Exam Chest and lung exam reveals -quiet, even and easy respiratory effort with no use of accessory muscles and on auscultation, normal breath sounds, no adventitious sounds and normal vocal resonance. Inspection Chest Wall - Normal. Back - normal.  Cardiovascular Cardiovascular examination reveals -normal heart sounds,  regular rate and rhythm with no murmurs and normal pedal pulses bilaterally.  Abdomen Note: Reducible 3 cm incisional hernia just above the umbilicus.   Musculoskeletal Normal Exam - Left-Upper Extremity Strength Normal and Lower Extremity  Strength Normal. Normal Exam - Right-Upper Extremity Strength Normal and Lower Extremity Strength Normal.  Lymphatic Head & Neck  General Head & Neck Lymphatics: Bilateral - Description - Normal. Axillary  General Axillary Region: Bilateral - Description - Normal. Tenderness - Non Tender.    Assessment & Plan (Neala Miggins A. Kang Ishida MD; 08/22/2016 11:01 AM)  INCISIONAL HERNIA, WITHOUT OBSTRUCTION OR GANGRENE (K43.2) Impression: Discussed laparoscopic and open repairs. Discussed the use of mesh. Discussed the pros and cons of surgery versus observation. She has agreed to proceed with repair of her incisional hernia with mesh using an open technique. The hernia is quite small. The risk of hernia repair include bleeding, infection, organ injury, bowel injury, bladder injury, nerve injury recurrent hernia, blood clots, worsening of underlying condition, chronic pain, mesh use, open surgery, death, and the need for other operattions. Pt agrees to proceed  Current Plans You are being scheduled for surgery - Our schedulers will call you.  You should hear from our office's scheduling department within 5 working days about the location, date, and time of surgery. We try to make accommodations for patient's preferences in scheduling surgery, but sometimes the OR schedule or the surgeon's schedule prevents [place] from making those accommodations.  If you have not heard from our office [phone]) in 5 working days, call the office and ask for your surgeon's nurse.  If you have other questions about your diagnosis, plan, or surgery, call the office and ask for your surgeon's nurse.  Pt Education - Pamphlet Given - Hernia Surgery: discussed with patient and  provided information. The anatomy & physiology of the abdominal wall was discussed. The pathophysiology of hernias was discussed. Natural history risks without surgery including progeressive enlargement, pain, incarceration, & strangulation was discussed. Contributors to complications such as smoking, obesity, diabetes, prior surgery, etc were discussed.  I feel the risks of no intervention will lead to serious problems that outweigh the operative risks; therefore, I recommended surgery to reduce and repair the hernia. I explained laparoscopic techniques with possible need for an open approach. I noted the probable use of mesh to patch and/or buttress the hernia repair  Risks such as bleeding, infection, abscess, need for further treatment, heart attack, death, and other risks were discussed. I noted a good likelihood this will help address the problem. Goals of post-operative recovery were discussed as well. Possibility that this will not correct all symptoms was explained. I stressed the importance of low-impact activity, aggressive pain control, avoiding constipation, & not pushing through pain to minimize risk of post-operative chronic pain or injury. Possibility of reherniation especially with smoking, obesity, diabetes, immunosuppression, and other health conditions was discussed. We will work to minimize complications.  An educational handout further explaining the pathology & treatment options was given as well. Questions were answered. The patient expresses understanding & wishes to proceed with surgery.  Pt Education - Consent for inguinal hernia - [place]: discussed with patient and provided information. HISTORY OF COLON CANCER (Z85.038) Impression: stable needs to see GI for endoscopy prior to surgery doing well otherwise

## 2016-08-22 NOTE — Telephone Encounter (Signed)
Patient is needing an incisional hernia repair before the end of the year.  She is not having any issues at this point and wants to discuss ECL prior to hernia surgery,.  She is advised that I will put her on the cancellation list.  For now she will keep the appt on 09/25/16

## 2016-08-24 ENCOUNTER — Encounter: Payer: Self-pay | Admitting: Genetic Counselor

## 2016-08-28 ENCOUNTER — Encounter (HOSPITAL_BASED_OUTPATIENT_CLINIC_OR_DEPARTMENT_OTHER): Payer: Self-pay | Admitting: *Deleted

## 2016-08-28 ENCOUNTER — Other Ambulatory Visit: Payer: Self-pay

## 2016-08-28 ENCOUNTER — Encounter (HOSPITAL_BASED_OUTPATIENT_CLINIC_OR_DEPARTMENT_OTHER)
Admission: RE | Admit: 2016-08-28 | Discharge: 2016-08-28 | Disposition: A | Discharge status: Home / Self Care | Payer: Medicare Other | Source: Ambulatory Visit | Attending: Surgery | Admitting: Surgery

## 2016-08-28 DIAGNOSIS — E119 Type 2 diabetes mellitus without complications: Secondary | ICD-10-CM | POA: Diagnosis not present

## 2016-08-28 DIAGNOSIS — K219 Gastro-esophageal reflux disease without esophagitis: Secondary | ICD-10-CM | POA: Diagnosis not present

## 2016-08-28 DIAGNOSIS — K649 Unspecified hemorrhoids: Secondary | ICD-10-CM | POA: Diagnosis not present

## 2016-08-28 DIAGNOSIS — Z87891 Personal history of nicotine dependence: Secondary | ICD-10-CM | POA: Diagnosis not present

## 2016-08-28 DIAGNOSIS — E7800 Pure hypercholesterolemia, unspecified: Secondary | ICD-10-CM | POA: Diagnosis not present

## 2016-08-28 DIAGNOSIS — Z79899 Other long term (current) drug therapy: Secondary | ICD-10-CM | POA: Diagnosis not present

## 2016-08-28 DIAGNOSIS — K432 Incisional hernia without obstruction or gangrene: Secondary | ICD-10-CM | POA: Diagnosis not present

## 2016-08-28 DIAGNOSIS — Z85038 Personal history of other malignant neoplasm of large intestine: Secondary | ICD-10-CM | POA: Diagnosis not present

## 2016-08-28 DIAGNOSIS — Z7984 Long term (current) use of oral hypoglycemic drugs: Secondary | ICD-10-CM | POA: Diagnosis not present

## 2016-08-28 DIAGNOSIS — Z7982 Long term (current) use of aspirin: Secondary | ICD-10-CM | POA: Diagnosis not present

## 2016-08-28 DIAGNOSIS — I10 Essential (primary) hypertension: Secondary | ICD-10-CM | POA: Diagnosis not present

## 2016-08-28 DIAGNOSIS — Z9049 Acquired absence of other specified parts of digestive tract: Secondary | ICD-10-CM | POA: Diagnosis not present

## 2016-08-28 LAB — CBC WITH DIFFERENTIAL/PLATELET
BASOS PCT: 0 %
Basophils Absolute: 0 10*3/uL (ref 0.0–0.1)
EOS ABS: 0.2 10*3/uL (ref 0.0–0.7)
EOS PCT: 3 %
HCT: 40.3 % (ref 36.0–46.0)
HEMOGLOBIN: 13.4 g/dL (ref 12.0–15.0)
Lymphocytes Relative: 29 %
Lymphs Abs: 2 10*3/uL (ref 0.7–4.0)
MCH: 31.2 pg (ref 26.0–34.0)
MCHC: 33.3 g/dL (ref 30.0–36.0)
MCV: 93.7 fL (ref 78.0–100.0)
MONOS PCT: 6 %
Monocytes Absolute: 0.4 10*3/uL (ref 0.1–1.0)
NEUTROS PCT: 62 %
Neutro Abs: 4.2 10*3/uL (ref 1.7–7.7)
Platelets: 296 10*3/uL (ref 150–400)
RBC: 4.3 MIL/uL (ref 3.87–5.11)
RDW: 12.9 % (ref 11.5–15.5)
WBC: 6.8 10*3/uL (ref 4.0–10.5)

## 2016-08-28 LAB — COMPREHENSIVE METABOLIC PANEL
ALBUMIN: 4.2 g/dL (ref 3.5–5.0)
ALK PHOS: 65 U/L (ref 38–126)
ALT: 41 U/L (ref 14–54)
ANION GAP: 11 (ref 5–15)
AST: 35 U/L (ref 15–41)
BILIRUBIN TOTAL: 0.5 mg/dL (ref 0.3–1.2)
BUN: 14 mg/dL (ref 6–20)
CALCIUM: 9.6 mg/dL (ref 8.9–10.3)
CO2: 24 mmol/L (ref 22–32)
Chloride: 105 mmol/L (ref 101–111)
Creatinine, Ser: 0.81 mg/dL (ref 0.44–1.00)
GLUCOSE: 118 mg/dL — AB (ref 65–99)
POTASSIUM: 4.1 mmol/L (ref 3.5–5.1)
Sodium: 140 mmol/L (ref 135–145)
TOTAL PROTEIN: 6.7 g/dL (ref 6.5–8.1)

## 2016-08-29 ENCOUNTER — Encounter (HOSPITAL_BASED_OUTPATIENT_CLINIC_OR_DEPARTMENT_OTHER)
Admission: RE | Disposition: A | Discharge status: Home / Self Care | Payer: Self-pay | Source: Ambulatory Visit | Attending: Surgery

## 2016-08-29 ENCOUNTER — Encounter (HOSPITAL_BASED_OUTPATIENT_CLINIC_OR_DEPARTMENT_OTHER): Payer: Self-pay | Admitting: *Deleted

## 2016-08-29 ENCOUNTER — Ambulatory Visit (HOSPITAL_BASED_OUTPATIENT_CLINIC_OR_DEPARTMENT_OTHER): Payer: Medicare Other | Admitting: Anesthesiology

## 2016-08-29 ENCOUNTER — Ambulatory Visit (HOSPITAL_BASED_OUTPATIENT_CLINIC_OR_DEPARTMENT_OTHER)
Admission: RE | Admit: 2016-08-29 | Discharge: 2016-08-29 | Disposition: A | Discharge status: Home / Self Care | Payer: Medicare Other | Source: Ambulatory Visit | Attending: Surgery | Admitting: Surgery

## 2016-08-29 DIAGNOSIS — K432 Incisional hernia without obstruction or gangrene: Secondary | ICD-10-CM | POA: Insufficient documentation

## 2016-08-29 DIAGNOSIS — I10 Essential (primary) hypertension: Secondary | ICD-10-CM | POA: Insufficient documentation

## 2016-08-29 DIAGNOSIS — Z7982 Long term (current) use of aspirin: Secondary | ICD-10-CM | POA: Insufficient documentation

## 2016-08-29 DIAGNOSIS — E119 Type 2 diabetes mellitus without complications: Secondary | ICD-10-CM | POA: Insufficient documentation

## 2016-08-29 DIAGNOSIS — K219 Gastro-esophageal reflux disease without esophagitis: Secondary | ICD-10-CM | POA: Insufficient documentation

## 2016-08-29 DIAGNOSIS — Z9049 Acquired absence of other specified parts of digestive tract: Secondary | ICD-10-CM | POA: Insufficient documentation

## 2016-08-29 DIAGNOSIS — K649 Unspecified hemorrhoids: Secondary | ICD-10-CM | POA: Insufficient documentation

## 2016-08-29 DIAGNOSIS — Z87891 Personal history of nicotine dependence: Secondary | ICD-10-CM | POA: Insufficient documentation

## 2016-08-29 DIAGNOSIS — Z85038 Personal history of other malignant neoplasm of large intestine: Secondary | ICD-10-CM | POA: Insufficient documentation

## 2016-08-29 DIAGNOSIS — Z79899 Other long term (current) drug therapy: Secondary | ICD-10-CM | POA: Insufficient documentation

## 2016-08-29 DIAGNOSIS — E7800 Pure hypercholesterolemia, unspecified: Secondary | ICD-10-CM | POA: Insufficient documentation

## 2016-08-29 DIAGNOSIS — Z7984 Long term (current) use of oral hypoglycemic drugs: Secondary | ICD-10-CM | POA: Insufficient documentation

## 2016-08-29 HISTORY — PX: INSERTION OF MESH: SHX5868

## 2016-08-29 HISTORY — PX: INCISIONAL HERNIA REPAIR: SHX193

## 2016-08-29 LAB — GLUCOSE, CAPILLARY
GLUCOSE-CAPILLARY: 122 mg/dL — AB (ref 65–99)
GLUCOSE-CAPILLARY: 103 mg/dL — AB (ref 65–99)

## 2016-08-29 SURGERY — REPAIR, HERNIA, INCISIONAL
Anesthesia: General | Site: Abdomen

## 2016-08-29 MED ORDER — LACTATED RINGERS IV SOLN
INTRAVENOUS | Status: DC
  Administered 2016-08-29 (×2): via INTRAVENOUS

## 2016-08-29 MED ORDER — PROPOFOL 10 MG/ML IV BOLUS
INTRAVENOUS | Status: AC
  Filled 2016-08-29: qty 20

## 2016-08-29 MED ORDER — CLINDAMYCIN PHOSPHATE 600 MG/50ML IV SOLN
INTRAVENOUS | Status: AC
  Filled 2016-08-29: qty 50

## 2016-08-29 MED ORDER — OXYCODONE HCL 5 MG PO TABS
5.0000 mg | ORAL_TABLET | Freq: Once | ORAL | Status: AC | PRN
  Administered 2016-08-29: 5 mg via ORAL

## 2016-08-29 MED ORDER — FENTANYL CITRATE (PF) 100 MCG/2ML IJ SOLN
INTRAMUSCULAR | Status: AC
  Filled 2016-08-29: qty 2

## 2016-08-29 MED ORDER — MIDAZOLAM HCL 2 MG/2ML IJ SOLN
1.0000 mg | INTRAMUSCULAR | Status: DC | PRN
  Administered 2016-08-29: 1 mg via INTRAVENOUS

## 2016-08-29 MED ORDER — HYDROMORPHONE HCL 1 MG/ML IJ SOLN: 0.2500 mg | INTRAMUSCULAR | Status: DC | PRN

## 2016-08-29 MED ORDER — EPHEDRINE SULFATE 50 MG/ML IJ SOLN
INTRAMUSCULAR | Status: DC | PRN
  Administered 2016-08-29: 10 mg via INTRAVENOUS

## 2016-08-29 MED ORDER — GLYCOPYRROLATE 0.2 MG/ML IJ SOLN
0.2000 mg | Freq: Once | INTRAMUSCULAR | Status: DC | PRN
Start: 2016-08-29 — End: 2016-08-29

## 2016-08-29 MED ORDER — OXYCODONE-ACETAMINOPHEN 5-325 MG PO TABS: 1.0000 | ORAL_TABLET | ORAL | 0 refills | Status: DC | PRN

## 2016-08-29 MED ORDER — MIDAZOLAM HCL 2 MG/2ML IJ SOLN
INTRAMUSCULAR | Status: AC
  Filled 2016-08-29: qty 2

## 2016-08-29 MED ORDER — LIDOCAINE HCL (CARDIAC) 20 MG/ML IV SOLN
INTRAVENOUS | Status: DC | PRN
  Administered 2016-08-29: 50 mg via INTRAVENOUS

## 2016-08-29 MED ORDER — CHLORHEXIDINE GLUCONATE CLOTH 2 % EX PADS: 6.0000 | MEDICATED_PAD | Freq: Once | CUTANEOUS | 0 refills | Status: AC

## 2016-08-29 MED ORDER — PHENYLEPHRINE HCL 10 MG/ML IJ SOLN
INTRAMUSCULAR | Status: DC | PRN
  Administered 2016-08-29 (×2): 40 ug via INTRAVENOUS

## 2016-08-29 MED ORDER — OXYCODONE HCL 5 MG PO TABS
ORAL_TABLET | ORAL | Status: AC
  Filled 2016-08-29: qty 1

## 2016-08-29 MED ORDER — FENTANYL CITRATE (PF) 100 MCG/2ML IJ SOLN
50.0000 ug | INTRAMUSCULAR | Status: DC | PRN
Start: 2016-08-29 — End: 2016-08-29
  Administered 2016-08-29: 100 ug via INTRAVENOUS

## 2016-08-29 MED ORDER — CHLORHEXIDINE GLUCONATE CLOTH 2 % EX PADS: 6.0000 | MEDICATED_PAD | Freq: Once | CUTANEOUS | Status: DC

## 2016-08-29 MED ORDER — CLINDAMYCIN PHOSPHATE 600 MG/50ML IV SOLN
600.0000 mg | Freq: Once | INTRAVENOUS | Status: AC
  Administered 2016-08-29: 600 mg via INTRAVENOUS

## 2016-08-29 MED ORDER — ONDANSETRON HCL 4 MG/2ML IJ SOLN
INTRAMUSCULAR | Status: AC
  Filled 2016-08-29: qty 2

## 2016-08-29 MED ORDER — KETOROLAC TROMETHAMINE 30 MG/ML IJ SOLN
INTRAMUSCULAR | Status: AC
  Filled 2016-08-29: qty 1

## 2016-08-29 MED ORDER — BUPIVACAINE-EPINEPHRINE 0.25% -1:200000 IJ SOLN
INTRAMUSCULAR | Status: DC | PRN
  Administered 2016-08-29: 17 mL

## 2016-08-29 MED ORDER — DEXAMETHASONE SODIUM PHOSPHATE 4 MG/ML IJ SOLN
INTRAMUSCULAR | Status: DC | PRN
  Administered 2016-08-29: 8 mg via INTRAVENOUS

## 2016-08-29 MED ORDER — ROCURONIUM BROMIDE 100 MG/10ML IV SOLN
INTRAVENOUS | Status: DC | PRN
  Administered 2016-08-29: 40 mg via INTRAVENOUS

## 2016-08-29 MED ORDER — ONDANSETRON HCL 4 MG/2ML IJ SOLN
INTRAMUSCULAR | Status: DC | PRN
Start: 2016-08-29 — End: 2016-08-29
  Administered 2016-08-29: 4 mg via INTRAVENOUS

## 2016-08-29 MED ORDER — METHOCARBAMOL 500 MG PO TABS: 500.0000 mg | ORAL_TABLET | Freq: Four times a day (QID) | ORAL | 0 refills | Status: DC | PRN

## 2016-08-29 MED ORDER — SUGAMMADEX SODIUM 200 MG/2ML IV SOLN
INTRAVENOUS | Status: DC | PRN
  Administered 2016-08-29: 140 mg via INTRAVENOUS

## 2016-08-29 MED ORDER — SCOPOLAMINE 1 MG/3DAYS TD PT72: 1.0000 | MEDICATED_PATCH | Freq: Once | TRANSDERMAL | Status: DC | PRN

## 2016-08-29 MED ORDER — PROPOFOL 10 MG/ML IV BOLUS
INTRAVENOUS | Status: DC | PRN
  Administered 2016-08-29: 150 mg via INTRAVENOUS

## 2016-08-29 SURGICAL SUPPLY — 44 items
ADH SKN CLS APL DERMABOND .7 (GAUZE/BANDAGES/DRESSINGS) ×1
BLADE CLIPPER SURG (BLADE) IMPLANT
BLADE SURG 10 STRL SS (BLADE) ×3 IMPLANT
BLADE SURG 15 STRL LF DISP TIS (BLADE) ×1 IMPLANT
BLADE SURG 15 STRL SS (BLADE) ×3
CANISTER SUCT 1200ML W/VALVE (MISCELLANEOUS) ×3 IMPLANT
CHLORAPREP W/TINT 26ML (MISCELLANEOUS) ×3 IMPLANT
COVER BACK TABLE 60X90IN (DRAPES) ×3 IMPLANT
COVER MAYO STAND STRL (DRAPES) ×3 IMPLANT
DECANTER SPIKE VIAL GLASS SM (MISCELLANEOUS) ×3 IMPLANT
DERMABOND ADVANCED (GAUZE/BANDAGES/DRESSINGS) ×2
DERMABOND ADVANCED .7 DNX12 (GAUZE/BANDAGES/DRESSINGS) ×1 IMPLANT
DRAPE LAPAROTOMY TRNSV 102X78 (DRAPE) ×3 IMPLANT
DRAPE UTILITY XL STRL (DRAPES) ×3 IMPLANT
ELECT COATED BLADE 2.86 ST (ELECTRODE) ×3 IMPLANT
ELECT REM PT RETURN 9FT ADLT (ELECTROSURGICAL) ×3
ELECTRODE REM PT RTRN 9FT ADLT (ELECTROSURGICAL) ×1 IMPLANT
GLOVE BIOGEL PI IND STRL 8 (GLOVE) ×1 IMPLANT
GLOVE BIOGEL PI INDICATOR 8 (GLOVE) ×2
GLOVE ECLIPSE 8.0 STRL XLNG CF (GLOVE) ×3 IMPLANT
GOWN STRL REUS W/ TWL LRG LVL3 (GOWN DISPOSABLE) ×2 IMPLANT
GOWN STRL REUS W/TWL LRG LVL3 (GOWN DISPOSABLE) ×6
MESH VENTRALEX ST 2.5 CRC MED (Mesh General) ×2 IMPLANT
NDL HYPO 25X1 1.5 SAFETY (NEEDLE) ×1 IMPLANT
NEEDLE HYPO 22GX1.5 SAFETY (NEEDLE) IMPLANT
NEEDLE HYPO 25X1 1.5 SAFETY (NEEDLE) ×3 IMPLANT
NS IRRIG 1000ML POUR BTL (IV SOLUTION) IMPLANT
PACK BASIN DAY SURGERY FS (CUSTOM PROCEDURE TRAY) ×3 IMPLANT
PENCIL BUTTON HOLSTER BLD 10FT (ELECTRODE) ×3 IMPLANT
SLEEVE SCD COMPRESS KNEE MED (MISCELLANEOUS) IMPLANT
STAPLER VISISTAT 35W (STAPLE) IMPLANT
SUT MON AB 4-0 PC3 18 (SUTURE) ×3 IMPLANT
SUT NOVA NAB DX-16 0-1 5-0 T12 (SUTURE) ×4 IMPLANT
SUT NOVA NAB GS-22 2 0 T19 (SUTURE) IMPLANT
SUT SILK 3 0 SH 30 (SUTURE) IMPLANT
SUT VIC AB 2-0 SH 27 (SUTURE) ×3
SUT VIC AB 2-0 SH 27XBRD (SUTURE) ×1 IMPLANT
SUT VICRYL 3-0 CR8 SH (SUTURE) IMPLANT
SYR CONTROL 10ML LL (SYRINGE) ×3 IMPLANT
TOWEL OR 17X24 6PK STRL BLUE (TOWEL DISPOSABLE) ×3 IMPLANT
TOWEL OR NON WOVEN STRL DISP B (DISPOSABLE) ×3 IMPLANT
TUBE CONNECTING 20'X1/4 (TUBING) ×1
TUBE CONNECTING 20X1/4 (TUBING) ×2 IMPLANT
YANKAUER SUCT BULB TIP NO VENT (SUCTIONS) ×3 IMPLANT

## 2016-08-29 NOTE — Interval H&P Note (Signed)
History and Physical Interval Note:  08/29/2016 1:52 PM  Linda Grant  has presented today for surgery, with the diagnosis of incisional hernia   The various methods of treatment have been discussed with the patient and family. After consideration of risks, benefits and other options for treatment, the patient has consented to  Procedure(s): HERNIA REPAIR INCISIONAL (N/A) INSERTION OF MESH (N/A) as a surgical intervention .  The patient's history has been reviewed, patient examined, no change in status, stable for surgery.  I have reviewed the patient's chart and labs.  Questions were answered to the patient's satisfaction.     [name]

## 2016-08-29 NOTE — Anesthesia Procedure Notes (Signed)
Procedure Name: Intubation Date/Time: 08/29/2016 2:04 PM Performed by: Melynda Ripple D Pre-anesthesia Checklist: Patient identified, Emergency Drugs available, Suction available and Patient being monitored Patient Re-evaluated:Patient Re-evaluated prior to inductionOxygen Delivery Method: Circle system utilized Preoxygenation: Pre-oxygenation with 100% oxygen Intubation Type: IV induction Ventilation: Mask ventilation without difficulty Laryngoscope Size: Mac and 3 Grade View: Grade I Tube type: Oral Number of attempts: 1 Airway Equipment and Method: Stylet and Oral airway Placement Confirmation: ETT inserted through vocal cords under direct vision,  positive ETCO2 and breath sounds checked- equal and bilateral Secured at: 22 cm Tube secured with: Tape Dental Injury: Teeth and Oropharynx as per pre-operative assessment

## 2016-08-29 NOTE — Anesthesia Preprocedure Evaluation (Addendum)
Anesthesia Evaluation  Patient identified by MRN, date of birth, ID band Patient awake    Reviewed: Allergy & Precautions, H&P , NPO status , Patient's Chart, lab work & pertinent test results  Airway Mallampati: II  TM Distance: >3 FB Neck ROM: Full    Dental no notable dental hx. (+) Teeth Intact, Dental Advisory Given   Pulmonary neg pulmonary ROS, former smoker,    Pulmonary exam normal breath sounds clear to auscultation       Cardiovascular hypertension, Pt. on medications  Rhythm:Regular Rate:Normal     Neuro/Psych negative neurological ROS  negative psych ROS   GI/Hepatic Neg liver ROS, GERD  Medicated and Controlled,  Endo/Other  diabetes, Type 2, Oral Hypoglycemic Agents  Renal/GU negative Renal ROS  negative genitourinary   Musculoskeletal  (+) Arthritis , Osteoarthritis,    Abdominal   Peds  Hematology negative hematology ROS (+) anemia ,   Anesthesia Other Findings   Reproductive/Obstetrics negative OB ROS                            Anesthesia Physical Anesthesia Plan  ASA: II  Anesthesia Plan: General   Post-op Pain Management:    Induction: Intravenous  Airway Management Planned: Oral ETT  Additional Equipment:   Intra-op Plan:   Post-operative Plan: Extubation in OR  Informed Consent: I have reviewed the patients History and Physical, chart, labs and discussed the procedure including the risks, benefits and alternatives for the proposed anesthesia with the patient or authorized representative who has indicated his/her understanding and acceptance.   Dental advisory given  Plan Discussed with: CRNA  Anesthesia Plan Comments:         Anesthesia Quick Evaluation

## 2016-08-29 NOTE — Anesthesia Postprocedure Evaluation (Signed)
Anesthesia Post Note  Patient: Linda Grant  Procedure(s) Performed: Procedure(s) (LRB): HERNIA REPAIR INCISIONAL (N/A) INSERTION OF MESH (N/A)  Patient location during evaluation: PACU Anesthesia Type: General Level of consciousness: awake and alert Pain management: pain level controlled Vital Signs Assessment: post-procedure vital signs reviewed and stable Respiratory status: spontaneous breathing, nonlabored ventilation and respiratory function stable Cardiovascular status: blood pressure returned to baseline and stable Postop Assessment: no signs of nausea or vomiting Anesthetic complications: no    Last Vitals:  Vitals:   08/29/16 1504 08/29/16 1515  BP:  117/60  Pulse: 99 94  Resp: 13 16  Temp:      Last Pain:  Vitals:   08/29/16 1530  TempSrc:   PainSc: 3                  Lakeishia Truluck,W. EDMOND

## 2016-08-29 NOTE — Transfer of Care (Signed)
Immediate Anesthesia Transfer of Care Note  Patient: Linda Grant  Procedure(s) Performed: Procedure(s) with comments: HERNIA REPAIR INCISIONAL (N/A) - HERNIA REPAIR INCISIONAL INSERTION OF MESH (N/A) - INSERTION OF MESH  Patient Location: PACU  Anesthesia Type:General  Level of Consciousness: awake, alert  and oriented  Airway & Oxygen Therapy: Patient Spontanous Breathing and Patient connected to face mask oxygen  Post-op Assessment: Report given to RN and Post -op Vital signs reviewed and stable  Post vital signs: Reviewed and stable  Last Vitals:  Vitals:   08/29/16 1503 08/29/16 1504  BP:    Pulse: 98 99  Resp:  13  Temp:      Last Pain:  Vitals:   08/29/16 1252  TempSrc: Oral      Patients Stated Pain Goal: 0 ([id number])  Complications: No apparent anesthesia complications

## 2016-08-29 NOTE — H&P (View-Only) (Signed)
Burley Saver 08/22/2016 10:31 AM Location: [facility] Patient #: [id number] DOB: 1941/10/24 Married / Language: Cleophus Molt / Race: White Female  History of Present Illness Marcello Moores A. Wing Schoch MD; 08/22/2016 10:59 AM) Patient words: hernia    Patient returns for follow-up after developing an incisional hernia. She underwent laparoscopic-assisted colectomy 4 years ago for colon cancer. She has been cancer free. Over the last 6 months she developed a bulge about her laparoscopic extraction site just above her umbilicus. She is having mild discomfort. She is having no nausea or vomiting. Hernia is reducible. She is due for upper and lower endoscopy secondary to a history of Barrett's esophagus and history of colon cancer. She is in the process of meeting her new gastroenterologist. She denies any history of blood per stool, constipation, diarrhea, or change in bowel habits.  The patient is a 75 year old female.   Problem List/Past Medical Ventura Sellers, [place]; 08/22/2016 10:32 AM) HISTORY OF COLON CANCER (Z85.038)  Other Problems Ventura Sellers, CMA; 08/22/2016 10:32 AM) Arthritis Colon Cancer Diabetes Mellitus Gastroesophageal Reflux Disease Heart murmur Hemorrhoids High blood pressure Hypercholesterolemia Kidney Stone  Past Surgical History Ventura Sellers, [place]; 08/22/2016 10:32 AM) Colon Polyp Removal - Colonoscopy Colon Removal - Partial Hysterectomy (not due to cancer) - Partial Oral Surgery  Diagnostic Studies History Ventura Sellers, [place]; 08/22/2016 10:32 AM) Mammogram within last year  Allergies Ventura Sellers, CMA; 08/22/2016 10:32 AM) Penicillin G Pot in Dextrose *PENICILLINS*  Medication History Ventura Sellers, CMA; 08/22/2016 10:34 AM) Lisinopril (20MG  Tablet, Oral) Active. Lovastatin (40MG  Tablet, Oral) Active. MetFORMIN HCl ER (500MG  Tablet ER 24HR, Oral) Active. Omeprazole (20MG  Capsule DR, Oral)  Active. Aspirin (81MG  Tablet, Oral) Active. Vitamin D (Oral) Specific dose unknown - Active. Glucosamine Chondr 500 Complex (Oral) Specific dose unknown - Active. Medications Reconciled  Social History Ventura Sellers, [place]; 08/22/2016 10:34 AM) Alcohol use Occasional alcohol use. Caffeine use Coffee. No drug use Tobacco use Former smoker.  Family History Ventura Sellers, [place]; 08/22/2016 10:34 AM) Arthritis Brother, Mother, Sister. Colon Cancer Sister. Colon Polyps Brother, Sister. Depression Brother. Diabetes Mellitus Brother. Heart Disease Brother, Father. Heart disease in female family member before age 42 Hypertension Brother, Father, Mother, Sister. Malignant Neoplasm Of Pancreas Sister. Migraine Headache Sister. Respiratory Condition Brother, Father, Sister.  Pregnancy / Birth History Ventura Sellers, [place]; 08/22/2016 10:34 AM) Age at menarche 68 years. Age of menopause 44-50 Contraceptive History Oral contraceptives. Gravida 0 Para 0    Vitals (Michelle R. Brooks CMA; 08/22/2016 10:32 AM) 08/22/2016 10:31 AM Weight: 144.38 lb Height: 60.25in Body Surface Area: 1.63 m Body Mass Index: 27.96 kg/m  BP: 126/80 (Sitting, Left Arm, Standard)      Physical Exam (Zane Samson A. Eshawn Coor MD; 08/22/2016 11:01 AM)  General Mental Status-Alert. General Appearance-Consistent with stated age. Hydration-Well hydrated. Voice-Normal.  Head and Neck Head-normocephalic, atraumatic with no lesions or palpable masses. Trachea-midline. Thyroid Gland Characteristics - normal size and consistency.  Chest and Lung Exam Chest and lung exam reveals -quiet, even and easy respiratory effort with no use of accessory muscles and on auscultation, normal breath sounds, no adventitious sounds and normal vocal resonance. Inspection Chest Wall - Normal. Back - normal.  Cardiovascular Cardiovascular examination reveals -normal heart sounds,  regular rate and rhythm with no murmurs and normal pedal pulses bilaterally.  Abdomen Note: Reducible 3 cm incisional hernia just above the umbilicus.   Musculoskeletal Normal Exam - Left-Upper Extremity Strength Normal and Lower Extremity  Strength Normal. Normal Exam - Right-Upper Extremity Strength Normal and Lower Extremity Strength Normal.  Lymphatic Head & Neck  General Head & Neck Lymphatics: Bilateral - Description - Normal. Axillary  General Axillary Region: Bilateral - Description - Normal. Tenderness - Non Tender.    Assessment & Plan (Akacia Boltz A. Melayna Robarts MD; 08/22/2016 11:01 AM)  INCISIONAL HERNIA, WITHOUT OBSTRUCTION OR GANGRENE (K43.2) Impression: Discussed laparoscopic and open repairs. Discussed the use of mesh. Discussed the pros and cons of surgery versus observation. She has agreed to proceed with repair of her incisional hernia with mesh using an open technique. The hernia is quite small. The risk of hernia repair include bleeding, infection, organ injury, bowel injury, bladder injury, nerve injury recurrent hernia, blood clots, worsening of underlying condition, chronic pain, mesh use, open surgery, death, and the need for other operattions. Pt agrees to proceed  Current Plans You are being scheduled for surgery - Our schedulers will call you.  You should hear from our office's scheduling department within 5 working days about the location, date, and time of surgery. We try to make accommodations for patient's preferences in scheduling surgery, but sometimes the OR schedule or the surgeon's schedule prevents [place] from making those accommodations.  If you have not heard from our office [phone]) in 5 working days, call the office and ask for your surgeon's nurse.  If you have other questions about your diagnosis, plan, or surgery, call the office and ask for your surgeon's nurse.  Pt Education - Pamphlet Given - Hernia Surgery: discussed with patient and  provided information. The anatomy & physiology of the abdominal wall was discussed. The pathophysiology of hernias was discussed. Natural history risks without surgery including progeressive enlargement, pain, incarceration, & strangulation was discussed. Contributors to complications such as smoking, obesity, diabetes, prior surgery, etc were discussed.  I feel the risks of no intervention will lead to serious problems that outweigh the operative risks; therefore, I recommended surgery to reduce and repair the hernia. I explained laparoscopic techniques with possible need for an open approach. I noted the probable use of mesh to patch and/or buttress the hernia repair  Risks such as bleeding, infection, abscess, need for further treatment, heart attack, death, and other risks were discussed. I noted a good likelihood this will help address the problem. Goals of post-operative recovery were discussed as well. Possibility that this will not correct all symptoms was explained. I stressed the importance of low-impact activity, aggressive pain control, avoiding constipation, & not pushing through pain to minimize risk of post-operative chronic pain or injury. Possibility of reherniation especially with smoking, obesity, diabetes, immunosuppression, and other health conditions was discussed. We will work to minimize complications.  An educational handout further explaining the pathology & treatment options was given as well. Questions were answered. The patient expresses understanding & wishes to proceed with surgery.  Pt Education - Consent for inguinal hernia - [place]: discussed with patient and provided information. HISTORY OF COLON CANCER (Z85.038) Impression: stable needs to see GI for endoscopy prior to surgery doing well otherwise

## 2016-08-29 NOTE — Op Note (Signed)
Preoperative diagnosis: Incisional hernia not incarcerated or strangulated  Postoperative diagnosis: Same  Procedure: Repair of incisional hernia with mesh  Surgeon: Thomas Cornett M.D.  Anesthesia: Gen. with 0.25% Sensorcaine local anesthetic with epinephrine  EBL: Minimal  Specimens: None  Drains: None  Indications for procedure: The patient is a 75-year-old female who had a previous laparoscopic right hemicolectomy for colon cancer 3 years ago. She developed a bulge in her laparoscopic extraction site just above her umbilicus. Upon examination she has small reducible incisional hernia. This is causing mild to moderate discomfort especially with physical activity. I discussed repair of this with her as well as use of mesh and laparoscopic versus open techniques. The pros and cons of this was discussed with the patient and her husband in the office. Observation was discussed. She opted for open repair of her incisional hernia after discussion of the above techniques the pros and cons of each.The risk of hernia repair include bleeding,  Infection,   Recurrence of the hernia,  Mesh use, chronic pain,  Organ injury,  Bowel injury,  Bladder injury,   nerve injury with numbness around the incision,  Death,  and worsening of preexisting  medical problems.  The alternatives to surgery have been discussed as well..  Long term expectations of both operative and non operative treatments have been discussed.   The patient agrees to proceed.    Description of procedure: The patient was met in the holding area and questions were answered. She was taken back to the operating room and placed upon the OR table. After induction of general anesthesia, the abdomen was prepped and draped in a sterile fashion. Timeout was done. She had a small midline incision just above her umbilicus. This is where her hernia was located. Local anesthetic was infiltrated skin. Incision was made through the old scar dissection was  carried down to encounter a 3 cm hernia bilateral extraction site. I opened the hernia sac. There is no omentum or other contents within the hernia. The fascia was identified and cleared off of all fatty tissue. The defect was about 2-1/2-3 cm in maximal diameter. A 6.4 cm piece of mesh was used. This was ventral light with the coated surface. This was placed in a subfascial position. It was secured to the fascia with #2 Novafil. There are no gaps between the mesh and the abdominal wall. This gave 3 cm of overlap. I then closed the fascial defect with 2-0 Novafil. Irrigation was used. The subcutaneous fat was closed with 3-0 Vicryl. 4 Monocryl was used to close skin a subcuticular fashion. Liquiband was applied. All final counts are found to be correct. Binder placed.The patient was awoke and extubated.She was taken to PACU in stable condition.  

## 2016-08-29 NOTE — Discharge Instructions (Signed)
[facility] _______[facility] ° °UMBILICAL OR INGUINAL HERNIA REPAIR: POST OP INSTRUCTIONS ° °Always review your discharge instruction sheet given to you by the facility where your surgery was performed. °IF YOU HAVE DISABILITY OR FAMILY LEAVE FORMS, YOU MUST BRING THEM TO THE OFFICE FOR PROCESSING.   °DO NOT GIVE THEM TO YOUR DOCTOR. ° °1. A  prescription for pain medication may be given to you upon discharge.  Take your pain medication as prescribed, if needed.  If narcotic pain medicine is not needed, then you may take acetaminophen (Tylenol) or ibuprofen (Advil) as needed. °2. Take your usually prescribed medications unless otherwise directed. °3. If you need a refill on your pain medication, please contact your pharmacy.  They will contact our office to request authorization. Prescriptions will not be filled after 5 pm or on week-ends. °4. You should follow a light diet the first 24 hours after arrival home, such as soup and crackers, etc.  Be sure to include lots of fluids daily.  Resume your normal diet the day after surgery. °5. Most patients will experience some swelling and bruising around the umbilicus or in the groin and scrotum.  Ice packs and reclining will help.  Swelling and bruising can take several days to resolve.  °6. It is common to experience some constipation if taking pain medication after surgery.  Increasing fluid intake and taking a stool softener (such as Colace) will usually help or prevent this problem from occurring.  A mild laxative (Milk of Magnesia or Miralax) should be taken according to package directions if there are no bowel movements after 48 hours. °7. Unless discharge instructions indicate otherwise, you may remove your bandages 24-48 hours after surgery, and you may shower at that time.  You may have steri-strips (small skin tapes) in place directly over the incision.  These strips should be left on the skin for 7-10 days.  If your surgeon used skin glue on the  incision, you may shower in 24 hours.  The glue will flake off over the next 2-3 weeks.  Any sutures or staples will be removed at the office during your follow-up visit. °8. ACTIVITIES:  You may resume regular (light) daily activities beginning the next day--such as daily self-care, walking, climbing stairs--gradually increasing activities as tolerated.  You may have sexual intercourse when it is comfortable.  Refrain from any heavy lifting or straining until approved by your doctor. °a. You may drive when you are no longer taking prescription pain medication, you can comfortably wear a seatbelt, and you can safely maneuver your car and apply brakes. °b. RETURN TO WORK:  __________________________________________________________ °9. You should see your doctor in the office for a follow-up appointment approximately 2-3 weeks after your surgery.  Make sure that you call for this appointment within a day or two after you arrive home to insure a convenient appointment time. °10. OTHER INSTRUCTIONS:  __________________________________________________________________________________________________________________________________________________________________________________________  °WHEN TO CALL YOUR DOCTOR: °1. Fever over 101.0 °2. Inability to urinate °3. Nausea and/or vomiting °4. Extreme swelling or bruising °5. Continued bleeding from incision. °6. Increased pain, redness, or drainage from the incision ° °The clinic staff is available to answer your questions during regular business hours.  Please don’t hesitate to call and ask to speak to one of the nurses for clinical concerns.  If you have a medical emergency, go to the nearest emergency room or call 911.  A surgeon from [facility] is always on call at the hospital ° ° °  [address], [address], [place], [place]  [address] ? ° [address] [address], [place], [place]   [address] °[phone] ? [phone] ? FAX [phone] °Web site:  [email or web address] ° °Post Anesthesia Home Care Instructions ° °Activity: °Get plenty of rest for the remainder of the day. A responsible adult should stay with you for 24 hours following the procedure.  °For the next 24 hours, DO NOT: °-Drive a car °-Operate machinery °-Drink alcoholic beverages °-Take any medication unless instructed by your physician °-Make any legal decisions or sign important papers. ° °Meals: °Start with liquid foods such as gelatin or soup. Progress to regular foods as tolerated. Avoid greasy, spicy, heavy foods. If nausea and/or vomiting occur, drink only clear liquids until the nausea and/or vomiting subsides. Call your physician if vomiting continues. ° °Special Instructions/Symptoms: °Your throat may feel dry or sore from the anesthesia or the breathing tube placed in your throat during surgery. If this causes discomfort, gargle with warm salt water. The discomfort should disappear within 24 hours. ° °If you had a scopolamine patch placed behind your ear for the management of post- operative nausea and/or vomiting: ° °1. The medication in the patch is effective for 72 hours, after which it should be removed.  Wrap patch in a tissue and discard in the trash. Wash hands thoroughly with soap and water. °2. You may remove the patch earlier than 72 hours if you experience unpleasant side effects which may include dry mouth, dizziness or visual disturbances. °3. Avoid touching the patch. Wash your hands with soap and water after contact with the patch. °  ° °

## 2016-08-31 ENCOUNTER — Encounter (HOSPITAL_BASED_OUTPATIENT_CLINIC_OR_DEPARTMENT_OTHER): Payer: Self-pay | Admitting: Surgery

## 2016-09-18 ENCOUNTER — Other Ambulatory Visit: Payer: Medicare Other

## 2016-09-18 DIAGNOSIS — C182 Malignant neoplasm of ascending colon: Secondary | ICD-10-CM

## 2016-09-18 LAB — CEA (IN HOUSE-CHCC): CEA (CHCC-In House): 2.81 ng/mL (ref 0.00–5.00)

## 2016-09-19 LAB — CEA: CEA1: 3 ng/mL (ref 0.0–4.7)

## 2016-09-20 ENCOUNTER — Ambulatory Visit (HOSPITAL_BASED_OUTPATIENT_CLINIC_OR_DEPARTMENT_OTHER): Payer: Medicare Other | Admitting: Oncology

## 2016-09-20 ENCOUNTER — Telehealth: Payer: Self-pay | Admitting: Oncology

## 2016-09-20 VITALS — BP 134/74 | HR 88 | Temp 98.1°F | Resp 18 | Ht 60.0 in | Wt 144.2 lb

## 2016-09-20 DIAGNOSIS — E119 Type 2 diabetes mellitus without complications: Secondary | ICD-10-CM

## 2016-09-20 DIAGNOSIS — C182 Malignant neoplasm of ascending colon: Secondary | ICD-10-CM

## 2016-09-20 DIAGNOSIS — Z800 Family history of malignant neoplasm of digestive organs: Secondary | ICD-10-CM | POA: Diagnosis not present

## 2016-09-20 DIAGNOSIS — Z85038 Personal history of other malignant neoplasm of large intestine: Secondary | ICD-10-CM | POA: Diagnosis not present

## 2016-09-20 NOTE — Telephone Encounter (Signed)
AVS report and appointment schedule given to patient, per 09/20/16 los. ° °

## 2016-09-20 NOTE — Progress Notes (Signed)
  [facility] OFFICE PROGRESS NOTE   Diagnosis: Colon cancer  INTERVAL HISTORY:   Mr. Sebring returns as scheduled. She feels well. Good appetite. She reports intentional weight loss. No recent rectal bleeding. She is seeing Dr. Carlean Purl next week to discuss the timing of upper and lower endoscopies. She underwent repair of an incisional hernia on 08/29/2016.  Objective:  Vital signs in last 24 hours:  Blood pressure 134/74, pulse 88, temperature 98.1 F (36.7 C), temperature source Oral, resp. rate 18, height 5' (1.524 m), weight 144 lb 3.2 oz (65.4 kg), SpO2 97 %.    HEENT: Neck without mass Lymphatics: No cervical, supra-clavicular, axillary, or inguinal nodes Resp: Lungs clear bilaterally Cardio: Regular rate and rhythm GI: No hepatomegaly, no mass, healing midline incision Vascular: No leg edema   Lab Results:  CEA on 09/18/2016-2.81  Medications: I have reviewed the patient's current medications.  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 Preoperative anemia, July 2013  4. Family history of colon/pancreas and lung cancer  5. Family history of pulmonary fibrosis  6. 4 mm subpleural nodule in the lateral basal segment of the left lower lobe on a CT 11/05/2013 at [facility]  7. gastroesophageal reflux disease, biopsy 07/28/2014 at the GE junction confirmed Barrett's esophagus 8. Diabetes    Disposition:  Ms. Dickard remains in clinical  remission from colon cancer. She will see Dr. Carlean Purl next week to schedule upper and lower endoscopies. She will return for an office visit and CEA in August 2018.  Betsy Coder, MD  09/20/2016  11:14 AM

## 2016-09-25 ENCOUNTER — Encounter: Payer: Self-pay | Admitting: Internal Medicine

## 2016-09-25 ENCOUNTER — Ambulatory Visit (INDEPENDENT_AMBULATORY_CARE_PROVIDER_SITE_OTHER): Payer: Medicare Other | Admitting: Internal Medicine

## 2016-09-25 VITALS — BP 132/68 | HR 108 | Ht 60.0 in | Wt 145.4 lb

## 2016-09-25 DIAGNOSIS — K2100 Gastro-esophageal reflux disease with esophagitis, without bleeding: Secondary | ICD-10-CM

## 2016-09-25 DIAGNOSIS — K2270 Barrett's esophagus without dysplasia: Secondary | ICD-10-CM

## 2016-09-25 DIAGNOSIS — Z85038 Personal history of other malignant neoplasm of large intestine: Secondary | ICD-10-CM | POA: Diagnosis not present

## 2016-09-25 DIAGNOSIS — Z800 Family history of malignant neoplasm of digestive organs: Secondary | ICD-10-CM

## 2016-09-25 DIAGNOSIS — K449 Diaphragmatic hernia without obstruction or gangrene: Secondary | ICD-10-CM

## 2016-09-25 NOTE — Patient Instructions (Addendum)
You have been scheduled for an endoscopy. Please follow written instructions given to you at your visit today. If you use inhalers (even only as needed), please bring them with you on the day of your procedure. Your physician has requested that you go to www.startemmi.com and enter the access code given to you at your visit today. This web site gives a general overview about your procedure. However, you should still follow specific instructions given to you by our office regarding your preparation for the procedure.   Take your omeprazole twice daily , 30 minutes before breakfast and supper.   Today we are giving you information to read on GERD.  Dr Carlean Purl is going to talk with Dr Brantley Stage about your recent hernia/mesh surgery.  I appreciate the opportunity to care for you. Silvano Rusk, MD, [facility]

## 2016-09-25 NOTE — Progress Notes (Signed)
Linda Grant 75 y.o. 20-Apr-1941 [id number]  Assessment & Plan:   Encounter Diagnoses  Name Primary?  . Barrett's esophagus without dysplasia Yes  . Gastroesophageal reflux disease with esophagitis   . Hiatal hernia   . Personal history of colon cancer   . Family history of colon cancer    EGD The risks and benefits as well as alternatives of endoscopic procedure(s) have been discussed and reviewed. All questions answered. The patient agrees to proceed.  PPI qac not qhs  Colonoscopy ok next year and even that is early  Will query that EGD ok s/p recent hernia repair - should be  Overall seems stable re: GERD, BArrett's and hx colon cancer  Cc;GATES,DONNA RUTH, MD Erroll Luna, MD   Subjective:   Chief Complaint: Barrett's esophagus  HPI Pleasant lady hx Barrett's esophagus and Colon Cancer - prior Dr. Olevia Perches patient -  On PPI - has heartburn2-3x a month usu when lying down at night Takes bid PPI AM and hs Referred to me through [place] to her granddaughter Next year 5 yrs after colon cancer dx and hoping to do an EGD and colonoscopy then for f/u  Recent (1 month ago) incisional hernia repair Last colonoscopy 2015 - negative post-op - 3 yr recall recommended EGD 2015 new dx Barrett's - bxs showed inflammation and glandular atypia - indeterminant for dysplasia  Allergies  Allergen Reactions  . Penicillins Anaphylaxis   Outpatient Medications Prior to Visit  Medication Sig Dispense Refill  . ACCU-CHEK AVIVA PLUS test strip     . aspirin 81 MG tablet Take 81 mg by mouth daily.    . Cholecalciferol (VITAMIN D3) 2000 UNITS TABS Take 2,000 Units by mouth daily.    . Glucosamine-Chondroit-Vit C-Mn (GLUCOSAMINE CHONDR 1500 COMPLX PO) Take 1 tablet by mouth 2 (two) times daily. 1500/1200    . lisinopril (PRINIVIL,ZESTRIL) 20 MG tablet Take 20 mg by mouth daily.    Marland Kitchen lovastatin (MEVACOR) 40 MG tablet Take 40 mg by mouth at bedtime.  3  . metFORMIN  (GLUCOPHAGE-XR) 500 MG 24 hr tablet Take 2,000 mg by mouth at bedtime.     Marland Kitchen omeprazole (PRILOSEC) 20 MG capsule Take 20 mg by mouth 2 (two) times daily before a meal.     . hydrocortisone (ANUSOL-HC) 25 MG suppository Place 1 suppository (25 mg total) rectally at bedtime as needed for hemorrhoids. (Patient not taking: Reported on 09/25/2016) 12 suppository 1  . methocarbamol (ROBAXIN) 500 MG tablet Take 1 tablet (500 mg total) by mouth every 6 (six) hours as needed for muscle spasms. (Patient not taking: Reported on 09/25/2016) 20 tablet 0  . oxyCODONE-acetaminophen (ROXICET) 5-325 MG tablet Take 1 tablet by mouth every 4 (four) hours as needed. (Patient not taking: Reported on 09/25/2016) 30 tablet 0   No facility-administered medications prior to visit.    Past Medical History:  Diagnosis Date  . 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 Anemia   . Arthritis    rt hip  . Barrett's esophagus   . Blood in stool   . Colon cancer ([place])   . Diabetes mellitus ([place])    type 2 - controlled  . Diverticulosis   . Elevated cholesterol   . GERD (gastroesophageal reflux disease)   . Heart murmur   . Hepatic lesion   . Hx of adenomatous colonic polyps 07/02/12  . Hyperplastic colon polyp   . Hypertension   . Internal hemorrhoids   . Mitral valve  prolapse   . Rectal bleeding   . Ventral hernia   . Vitamin D deficiency    Past Surgical History:  Procedure Laterality Date  . COLON SURGERY    . COLONOSCOPY    . [place]   Dental Implants  . INCISIONAL HERNIA REPAIR N/A 08/29/2016   Procedure: HERNIA REPAIR INCISIONAL;  Surgeon: Erroll Luna, MD;  Location: [facility];  Service: General;  Laterality: N/A;  [place]  . INSERTION OF MESH N/A 08/29/2016   Procedure: INSERTION OF MESH;  Surgeon: Erroll Luna, MD;  Location: [facility];  Service: General;  Laterality: N/A;  INSERTION OF MESH  . MOUTH SURGERY     dental implants  .  PARTIAL COLECTOMY  07/28/2012  . VAGINAL HYSTERECTOMY     Social History   Social History  . Marital status: Married    Spouse name: N/A  . Number of children: N/A  . Years of education: N/A   Occupational History  . retired    Social History Main Topics  . Smoking status: Former Smoker    Packs/day: 0.20    Types: Cigarettes    Quit date: 07/10/1982  . Smokeless tobacco: Never Used     Comment: social smoker - only 3-4 cigs in a day and not everyday  . Alcohol use 1.2 - 1.8 oz/week    2 - 3 Glasses of wine per week     Comment: glass of wine w/ dinner -- 2-3x per week  . Drug use: No  . Sexual activity: Not Currently   Other Topics Concern  . None   Social History Narrative   Retired Tourist information centre manager   No biological children   #3 children she raised and husband has #3 children from his previous marriage   Family History  Problem Relation Age of Onset  . Lung cancer Sister 68    smoker  . Diabetes Brother     deceased  . COPD Brother     d. 22  . Diabetes Brother 28  . Heart disease Father   . Pulmonary fibrosis Father     d. 53  . Colon cancer Sister 76    w/ mets to pancreas at 40y; negative genetic testing ([facility] myRisk in 2014)  . Pulmonary fibrosis Sister     d. 29  . Colon polyps Sister     "benign"; unspecified number  . Breast cancer Maternal Aunt     older than 50y  . Uterine cancer Cousin 18    maternal 1st cousin, once-removed  . Breast cancer Cousin     maternal 1st cousin  . Prostate cancer Cousin     first cousin (he is a maternal and paternal 1st cousin)  . Bladder Cancer Cousin     bladder cancer w/ intestinal involvement; dx 40s-50s  . Pulmonary fibrosis Paternal Uncle   . Colon polyps Other     niece w/ "several polyps"; dx <50y  . Heart attack Maternal Uncle     d. late 50s-early 60s  . Heart attack Maternal Grandmother 89  . Heart attack Maternal Grandfather   . Heart attack Paternal Grandmother     d. 15s-80s  . Heart attack Paternal  Grandfather     d. late 70s-early 80s  . Breast cancer Maternal Aunt     dx late 21s  . Diabetes Maternal Aunt   . Heart Problems Maternal Aunt   . Diabetes Maternal Aunt   .  Heart Problems Maternal Aunt   . Diabetes Maternal Aunt   . Heart Problems Maternal Aunt   . Heart attack Maternal Uncle   . Heart attack Maternal Uncle   . Melanoma Paternal Uncle   . Leukemia Cousin     paternal 1st cousin, once-removed dx 62s  . Esophageal cancer Neg Hx   . Rectal cancer Neg Hx   . Stomach cancer Neg Hx     Review of Systems Neg o/w  Objective:   Physical Exam @BP  132/68 (BP Location: Left Arm, Patient Position: Sitting, Cuff Size: Normal)   Pulse (!) 108   Ht 5' (1.524 m)   Wt 145 lb 6 oz (65.9 kg)   BMI 28.39 kg/m @  General:  NAD Eyes:   anicteric Lungs:  clear Heart::  S1S2 no rubs, murmurs or gallops Psych:  Appropriate mood/affect     Data Reviewed:  As per HPI

## 2016-09-26 ENCOUNTER — Telehealth: Payer: Self-pay

## 2016-09-26 NOTE — Telephone Encounter (Signed)
Informed Clair Gulling (husband) what Dr Brantley Stage said and he will tell [facility]. She was not home currently.

## 2016-09-26 NOTE — Telephone Encounter (Signed)
-----   Message from Gatha Mayer, MD sent at 09/26/2016  9:40 AM EDT ----- Regarding: FW: ok for egd Let her know we have green light from Dr. Brantley Stage re: EGD ----- Message ----- From: Erroll Luna, MD Sent: 09/26/2016   5:18 AM To: Gatha Mayer, MD Subject: RE: ok for egd                                 It's ok to do egd Thx ----- Message ----- From: Gatha Mayer, MD Sent: 09/25/2016   9:36 PM To: Erroll Luna, MD Subject: ok for egd                                     Incisional hernia repair 1 month ago  She was concerned about egd after that  I told her I would check with you - would not think a problem to do an egd  Thanks  Glendell Docker

## 2016-10-01 DIAGNOSIS — Z1379 Encounter for other screening for genetic and chromosomal anomalies: Secondary | ICD-10-CM | POA: Insufficient documentation

## 2016-10-01 DIAGNOSIS — Z803 Family history of malignant neoplasm of breast: Secondary | ICD-10-CM | POA: Insufficient documentation

## 2016-10-01 NOTE — Progress Notes (Signed)
GENETIC TEST RESULT  HPI: Ms. Aytes was previously seen in the [facility] due to a personal history of colorectal cancer, family history of colon, uterine, breast, and other cancers, and concerns regarding a hereditary predisposition to cancer. Ms. Ditmars previously had negative MLH1 and PMS2 genetic testing through [facility] in approximately 2013.  She returned to Genetics Clinic to discuss updated and more comprehensive genetic testing options.  Please refer to our prior cancer genetics clinic note from July 31, 2016 for more information regarding Ms. Hoose's medical, social and family histories, and our assessment and recommendations, at the time. Ms. Sorter's recent genetic test results were disclosed to her, as were recommendations warranted by these results. These results and recommendations are discussed in more detail below.  GENETIC TEST RESULTS: At the time of Ms. Armon's visit on 07/31/16, we recommended she pursue genetic testing of the 32-gene Comprehensive Cancer Panel with MSH2 Exons 1-7 Inversion Analysis through [facility]. The Comprehensive Cancer Panel offered by [facility] (Gaithersbug, MD) includes sequencing and/or deletion duplication testing of the following 32 genes: APC, ATM, AXIN2, BARD1, BMPR1A, BRCA1, BRCA2, BRIP1, CDH1, CDK4, CDKN2A, CHEK2, EPCAM, FANCC, MLH1, MSH2, MSH6, MUTYH, NBN, PALB2, PMS2, POLD1, POLE, PTEN, RAD51C, RAD51D, SCG5/GREM1, SMAD4, STK11, TP53, VHL, and XRCC2.  Those results are now back, the report date for which is August 15, 2016.  Genetic testing was normal, and did not reveal a known deleterious mutation in these genes.  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  The test report will be scanned into EPIC and will be located under the Results Review tab in the Pathology>Molecular Pathology section.   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 At this time, it is unknown if this VUS is associated with an increased risk for cancer or if this is a normal finding. Since this VUS result is uncertain, it cannot help guide screening recommendations, and family members should not be tested for this VUS to help define their own cancer risks.  Also, we all have variants within our genes that make us unique individuals--most of these variants are benign.  Thus, we treat this VUS as a negative result until it gets updated by the lab.   With time, we suspect the lab will reclassify this variant and when they do, we will try to re-contact Ms. Ciancio to discuss the reclassification further.  We also encouraged Ms. Dolloff to contact us in a year or two to obtain an update on the status of this VUS.  We discussed with Ms. Harmon that since the current genetic testing is not perfect, it is possible there may be a gene mutation in one of these genes that current testing cannot detect, but that chance is small. We also discussed, that it is possible that another gene that has not yet been discovered, or that we have not yet tested, is responsible for the cancer diagnoses in the family, and it is, therefore, important to remain in touch with cancer genetics in the future so that we can continue to offer Ms. Gavel the most up-to-date genetic testing.     CANCER SCREENING RECOMMENDATIONS: While we still do not have an explanation for the personal and family history of cancer, this result may be reassuring and indicate that Ms. Ambers likely does not have an increased risk for a future cancer due to a   mutation in one of these genes. This normal test also suggests that Ms. Lipsky's cancer was most likely not due to an inherited predisposition associated with one of these genes.  Most cancers happen by chance and this negative test suggests that her cancer falls into this category.  We, therefore, recommended  she continue to follow the cancer management and screening guidelines provided by her oncology and primary healthcare providers.   RECOMMENDATIONS FOR FAMILY MEMBERS: Men and women in this family might be at some increased risk of developing cancer, over the general population risk, simply due to the family history of cancer. We recommended women in this family have a yearly mammogram beginning at age 67, or 72 years younger than the earliest onset of cancer, an annual clinical breast exam, and perform monthly breast self-exams. Women in this family should also have a gynecological exam as recommended by their primary provider. All family members should have a colonoscopy by age 19, or earlier as recommended by a provider.  Ms. Erker reports that her sister who was diagnosed with colon cancer at the age of 50 (and who has since passed away) had negative myRisk Panel genetic testing in 2014.  We discussed that it would be helpful to verify this and to also determine whether Ms. Peschke's sister had this same MSH6 gene VUS.  If she would be able to obtain a copy of her sister's test report, we encourage her to share that information with [place].    Based on Ms. Fromer's family history, we recommended her maternal aunts and cousins, who were diagnosed with breast and uterine cancers, have genetic counseling and testing. Ms. Bosak will let us know if we can be of any assistance in coordinating genetic counseling and/or testing for these family members.  For family members who live in different cities, they can use the website of the [employer] ([email or web address]) to find a Oncologist by zip code.  FOLLOW-UP: Lastly, we discussed with Ms. Titus that cancer genetics is a rapidly advancing field and it is possible that new genetic tests will be appropriate for her and/or her family members in the future. We encouraged her to remain in contact with cancer genetics on an annual basis so we  can update her personal and family histories and let her know of advances in cancer genetics that may benefit this family.   Our contact number was provided. Ms. Vanderpool questions were answered to her satisfaction, and she knows she is welcome to call us at anytime with additional questions or concerns.   Jeanine Luz, MS, [facility] Certified Genetic Counselor [email or web address] Phone: [phone]

## 2016-10-18 ENCOUNTER — Encounter: Payer: Self-pay | Admitting: Internal Medicine

## 2016-11-01 ENCOUNTER — Encounter: Payer: Self-pay | Admitting: Internal Medicine

## 2016-11-01 ENCOUNTER — Ambulatory Visit (AMBULATORY_SURGERY_CENTER): Payer: Medicare Other | Admitting: Internal Medicine

## 2016-11-01 VITALS — BP 117/72 | HR 74 | Temp 97.3°F | Resp 13 | Ht 60.0 in | Wt 145.0 lb

## 2016-11-01 DIAGNOSIS — K2270 Barrett's esophagus without dysplasia: Secondary | ICD-10-CM | POA: Diagnosis present

## 2016-11-01 DIAGNOSIS — K208 Other esophagitis: Secondary | ICD-10-CM | POA: Diagnosis not present

## 2016-11-01 LAB — GLUCOSE, CAPILLARY
Glucose-Capillary: 114 mg/dL — ABNORMAL HIGH (ref 65–99)
Glucose-Capillary: 101 mg/dL — ABNORMAL HIGH (ref 65–99)

## 2016-11-01 MED ORDER — SODIUM CHLORIDE 0.9 % IV SOLN: 500.0000 mL | INTRAVENOUS | Status: DC

## 2016-11-01 NOTE — Progress Notes (Signed)
To PACU, vss patent aw report to rn 

## 2016-11-01 NOTE — Op Note (Signed)
[facility] Patient Name: Linda Grant Procedure Date: 11/01/2016 10:20 AM MRN: [id number] Endoscopist: Gatha Mayer , MD Age: 75 Referring MD:  Date of Birth: 02-Oct-1941 Gender: Female Account #: [id number] Procedure:                Upper GI endoscopy Indications:              Follow-up of Barrett's esophagus Medicines:                Propofol per Anesthesia, Monitored Anesthesia Care Procedure:                Pre-Anesthesia Assessment:                           - Prior to the procedure, a History and Physical                            was performed, and patient medications and                            allergies were reviewed. The patient's tolerance of                            previous anesthesia was also reviewed. The risks                            and benefits of the procedure and the sedation                            options and risks were discussed with the patient.                            All questions were answered, and informed consent                            was obtained. Prior Anticoagulants: The patient                            last took aspirin 1 day prior to the procedure. ASA                            Grade Assessment: III - A patient with severe                            systemic disease. After reviewing the risks and                            benefits, the patient was deemed in satisfactory                            condition to undergo the procedure.                           After obtaining informed consent, the endoscope was  passed under direct vision. Throughout the                            procedure, the patient's blood pressure, pulse, and                            oxygen saturations were monitored continuously. The                            Model GIF-[id number]) scope was introduced                            through the mouth, and advanced to the second part                            of  duodenum. The upper GI endoscopy was                            accomplished without difficulty. The patient                            tolerated the procedure well. Scope In: Scope Out: Findings:                 The Z-line was irregular and was found at the                            gastroesophageal junction. Biopsies were taken with                            a cold forceps for histology. Estimated blood loss                            was minimal. Verification of patient identification                            for the specimen was done.                           A 4 cm hiatal hernia was present.                           The exam was otherwise without abnormality.                           The cardia and gastric fundus were normal on                            retroflexion. Complications:            No immediate complications. Estimated Blood Loss:     Estimated blood loss was minimal. Impression:               - Z-line irregular, at the gastroesophageal  junction. Biopsied.                           - 4 cm hiatal hernia.                           - The examination was otherwise normal. Recommendation:           - Patient has a contact number available for                            emergencies. The signs and symptoms of potential                            delayed complications were discussed with the                            patient. Return to normal activities tomorrow.                            Written discharge instructions were provided to the                            patient.                           - Resume previous diet.                           - Continue present medications.                           - Await pathology results.                           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

## 2016-11-01 NOTE — Progress Notes (Signed)
Called to room to assist during endoscopic procedure.  Patient ID and intended procedure confirmed with present staff. Received instructions for my participation in the procedure from the performing physician.  

## 2016-11-01 NOTE — Patient Instructions (Addendum)
I am not convinced you actually have Barrett's esophagus - newer criteria would suggest not. I did take biopsies to help decide and will let you know the results and plans in a letter.  You also have a hiatal hernia as before.  I appreciate the opportunity to care for you. Gatha Mayer, MD, FACG   YOU HAD AN ENDOSCOPIC PROCEDURE TODAY AT [facility]:   Refer to the procedure report that was given to you for any specific questions about what was found during the examination.  If the procedure report does not answer your questions, please call your gastroenterologist to clarify.  If you requested that your care partner not be given the details of your procedure findings, then the procedure report has been included in a sealed envelope for you to review at your convenience later.  YOU SHOULD EXPECT: Some feelings of bloating in the abdomen. Passage of more gas than usual.  Walking can help get rid of the air that was put into your GI tract during the procedure and reduce the bloating. If you had a lower endoscopy (such as a colonoscopy or flexible sigmoidoscopy) you may notice spotting of blood in your stool or on the toilet paper. If you underwent a bowel prep for your procedure, you may not have a normal bowel movement for a few days.  Please Note:  You might notice some irritation and congestion in your nose or some drainage.  This is from the oxygen used during your procedure.  There is no need for concern and it should clear up in a day or so.  SYMPTOMS TO REPORT IMMEDIATELY:     Following upper endoscopy (EGD)  Vomiting of blood or coffee ground material  New chest pain or pain under the shoulder blades  Painful or persistently difficult swallowing  New shortness of breath  Fever of 100F or higher  Black, tarry-looking stools  For urgent or emergent issues, a gastroenterologist can be reached at any hour by calling [phone].   DIET:  We do  recommend a small meal at first, but then you may proceed to your regular diet.  Drink plenty of fluids but you should avoid alcoholic beverages for 24 hours.  ACTIVITY:  You should plan to take it easy for the rest of today and you should NOT DRIVE or use heavy machinery until tomorrow (because of the sedation medicines used during the test).    FOLLOW UP: Our staff will call the number listed on your records the next business day following your procedure to check on you and address any questions or concerns that you may have regarding the information given to you following your procedure. If we do not reach you, we will leave a message.  However, if you are feeling well and you are not experiencing any problems, there is no need to return our call.  We will assume that you have returned to your regular daily activities without incident.  If any biopsies were taken you will be contacted by phone or by letter within the next 1-3 weeks.  Please call us at [phone] if you have not heard about the biopsies in 3 weeks.    SIGNATURES/CONFIDENTIALITY: You and/or your care partner have signed paperwork which will be entered into your electronic medical record.  These signatures attest to the fact that that the information above on your After Visit Summary has been reviewed and is understood.  Full responsibility of the confidentiality  of this discharge information lies with you and/or your care-partner.   Information on hiatal hernia and GERD given to you today  Continue present medications

## 2016-11-02 ENCOUNTER — Telehealth: Payer: Self-pay

## 2016-11-02 NOTE — Telephone Encounter (Signed)
  Follow up Call-  Call back number 11/01/2016 07/28/2014  Post procedure Call Back phone  # [phone]  Permission to leave phone message Yes Yes  Some recent data might be hidden     Patient questions:  Do you have a fever, pain , or abdominal swelling? No. Pain Score  0 *  Have you tolerated food without any problems? Yes.    Have you been able to return to your normal activities? Yes.    Do you have any questions about your discharge instructions: Diet   No. Medications  No. Follow up visit  No.  Do you have questions or concerns about your Care? No.  Actions: * If pain score is 4 or above: No action needed, pain <4.

## 2016-11-02 NOTE — Telephone Encounter (Signed)
  Follow up Call-  Call back number 11/01/2016 07/28/2014  Post procedure Call Back phone  # [phone]  Permission to leave phone message Yes Yes  Some recent data might be hidden    Patient was called for follow up after her procedure on 11/01/2016. No answer at the number given for follow up phone call. A message was left on the answering machine.

## 2016-11-02 NOTE — Telephone Encounter (Signed)
  Follow up Call-  Call back number 11/01/2016 07/28/2014  Post procedure Call Back phone  # [phone]  Permission to leave phone message Yes Yes  Some recent data might be hidden    Patient was called for follow up after her procedure on 11/01/2016. No answer at the number given for follow up phone call. A message  A message was left on the answering machine.

## 2016-11-08 NOTE — Progress Notes (Signed)
No Barrett's esophagus My Chart note to patient No recall

## 2017-03-06 ENCOUNTER — Encounter (HOSPITAL_COMMUNITY): Payer: Self-pay

## 2017-03-06 ENCOUNTER — Other Ambulatory Visit: Payer: Self-pay | Admitting: Family Medicine

## 2017-03-06 DIAGNOSIS — Z1231 Encounter for screening mammogram for malignant neoplasm of breast: Secondary | ICD-10-CM

## 2017-03-29 ENCOUNTER — Ambulatory Visit
Admission: RE | Admit: 2017-03-29 | Discharge: 2017-03-29 | Disposition: A | Discharge status: Home / Self Care | Payer: Medicare Other | Source: Ambulatory Visit | Attending: Family Medicine | Admitting: Family Medicine

## 2017-03-29 DIAGNOSIS — Z1231 Encounter for screening mammogram for malignant neoplasm of breast: Secondary | ICD-10-CM

## 2017-05-24 ENCOUNTER — Encounter (HOSPITAL_COMMUNITY): Payer: Self-pay

## 2017-06-19 ENCOUNTER — Encounter: Payer: Self-pay | Admitting: Internal Medicine

## 2017-07-03 HISTORY — PX: CATARACT EXTRACTION: SUR2

## 2017-07-16 ENCOUNTER — Telehealth: Payer: Self-pay | Admitting: Oncology

## 2017-07-16 ENCOUNTER — Other Ambulatory Visit (HOSPITAL_BASED_OUTPATIENT_CLINIC_OR_DEPARTMENT_OTHER): Payer: Medicare Other

## 2017-07-16 ENCOUNTER — Ambulatory Visit (HOSPITAL_BASED_OUTPATIENT_CLINIC_OR_DEPARTMENT_OTHER): Payer: Medicare Other | Admitting: Oncology

## 2017-07-16 VITALS — BP 118/54 | HR 95 | Temp 98.5°F | Resp 18 | Ht 60.0 in | Wt 142.3 lb

## 2017-07-16 DIAGNOSIS — Z800 Family history of malignant neoplasm of digestive organs: Secondary | ICD-10-CM | POA: Diagnosis not present

## 2017-07-16 DIAGNOSIS — E119 Type 2 diabetes mellitus without complications: Secondary | ICD-10-CM

## 2017-07-16 DIAGNOSIS — Z85038 Personal history of other malignant neoplasm of large intestine: Secondary | ICD-10-CM | POA: Diagnosis not present

## 2017-07-16 DIAGNOSIS — C182 Malignant neoplasm of ascending colon: Secondary | ICD-10-CM

## 2017-07-16 DIAGNOSIS — Z85118 Personal history of other malignant neoplasm of bronchus and lung: Secondary | ICD-10-CM

## 2017-07-16 LAB — CEA (IN HOUSE-CHCC): CEA (CHCC-In House): 3.2 ng/mL (ref 0.00–5.00)

## 2017-07-16 NOTE — Telephone Encounter (Signed)
Scheduled appt per 8/14 los - gave patient AVS and calender per los  

## 2017-07-16 NOTE — Progress Notes (Signed)
  [place] OFFICE PROGRESS NOTE   Diagnosis: Colon cancer  INTERVAL HISTORY:   Linda Grant returns as scheduled. She feels well. She is concerned the abdominal hernia may have returned. She underwent an upper endoscopy by Dr. Carlean Purl on 11/01/2016. Irregularity at the Z line was biopsied and the pathology revealed benign changes.  She is scheduled for colonoscopy next month.  Objective:  Vital signs in last 24 hours:  Blood pressure (!) 118/54, pulse 95, temperature 98.5 F (36.9 C), temperature source Oral, resp. rate 18, height 5' (1.524 m), weight 142 lb 4.8 oz (64.5 kg), SpO2 97 %.    HEENT: Neck without mass Lymphatics: No cervical, supraclavicular, axillary, or inguinal nodes Resp: Lungs clear bilaterally Cardio: Regular rate and rhythm GI: No hepatosplenomegaly, no mass, nontender, no apparent hernia Vascular: No leg edema    Lab Results:    Lab Results  Component Value Date   CEA1 2.81 09/18/2016   CEA1 3.0 09/18/2016     Medications: I have reviewed the patient's current medications.  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 Preoperative anemia, July 2013  4. Family history of colon/pancreas and lung cancer  5. Family history of pulmonary fibrosis  6. 4 mm subpleural nodule in the lateral basal segment of the left lower lobe on a CT 11/05/2013 at [facility]  7. gastroesophageal reflux disease, biopsy 07/28/2014 at the [facility]  junction confirmed Barrett's esophagus  Upper endoscopy November 2017-biopsy negative for Barrett's esophagus 8. Diabetes    Disposition:  Linda Grant remains in clinical remission from colon cancer. She will undergo a surveillance colonoscopy next month. We will follow-up on the CEA from today.  She would like to continue follow-up at the [facility]. Linda Grant will return for an office visit and CEA in one year.  15 minutes were spent with the patient today. The majority of the time was used for counseling and coordination of care.  Donneta Romberg, MD  07/16/2017  11:24 AM

## 2017-07-17 LAB — CEA: CEA: 3.7 ng/mL (ref 0.0–4.7)

## 2017-07-25 ENCOUNTER — Telehealth: Payer: Self-pay | Admitting: *Deleted

## 2017-07-25 NOTE — Telephone Encounter (Signed)
Dr. Carlean Purl,  This pt is coming in on 08-07-17 for a PV.  Per the appointment note, endo and colon have been scheduled per Dr. Benay Spice d/t hx of colon CA and Barretts.  She had an EGD with you in 2017 where you said there was no Barretts noted and no recall needed.  Does she still need the EGD?  Please advise, Cyril Mourning

## 2017-07-25 NOTE — Telephone Encounter (Signed)
She only needs colonoscopy  NO EGD  THANKS!

## 2017-08-07 ENCOUNTER — Ambulatory Visit (AMBULATORY_SURGERY_CENTER): Payer: Self-pay | Admitting: *Deleted

## 2017-08-07 VITALS — Ht 61.0 in | Wt 144.0 lb

## 2017-08-07 DIAGNOSIS — Z800 Family history of malignant neoplasm of digestive organs: Secondary | ICD-10-CM

## 2017-08-07 DIAGNOSIS — Z85038 Personal history of other malignant neoplasm of large intestine: Secondary | ICD-10-CM

## 2017-08-07 NOTE — Progress Notes (Signed)
Patient denies any allergies to eggs or soy. Patient denies any problems with anesthesia/sedation. Patient denies any oxygen use at home and does not take any diet/weight loss medications. Patient declined EMMI

## 2017-08-08 ENCOUNTER — Encounter: Payer: Self-pay | Admitting: Internal Medicine

## 2017-08-20 ENCOUNTER — Encounter: Payer: Self-pay | Admitting: Internal Medicine

## 2017-08-20 ENCOUNTER — Ambulatory Visit (AMBULATORY_SURGERY_CENTER): Payer: Medicare Other | Admitting: Internal Medicine

## 2017-08-20 VITALS — BP 115/65 | HR 76 | Temp 96.2°F | Resp 15 | Ht 61.0 in | Wt 144.0 lb

## 2017-08-20 DIAGNOSIS — Z800 Family history of malignant neoplasm of digestive organs: Secondary | ICD-10-CM | POA: Diagnosis not present

## 2017-08-20 DIAGNOSIS — Z85038 Personal history of other malignant neoplasm of large intestine: Secondary | ICD-10-CM | POA: Diagnosis not present

## 2017-08-20 MED ORDER — SODIUM CHLORIDE 0.9 % IV SOLN: 500.0000 mL | INTRAVENOUS | Status: DC

## 2017-08-20 NOTE — Progress Notes (Signed)
Report given to PACU, vss 

## 2017-08-20 NOTE — Progress Notes (Signed)
Pt's states no medical or surgical changes since previsit or office visit. 

## 2017-08-20 NOTE — Patient Instructions (Addendum)
   Diverticulosis but no polyps or cancer!  Your next routine colonoscopy should be in 3 years 2021.  I appreciate the opportunity to care for you. Gatha Mayer, MD, FACG YOU HAD AN ENDOSCOPIC PROCEDURE TODAY AT [facility]:   Refer to the procedure report that was given to you for any specific questions about what was found during the examination.  If the procedure report does not answer your questions, please call your gastroenterologist to clarify.  If you requested that your care partner not be given the details of your procedure findings, then the procedure report has been included in a sealed envelope for you to review at your convenience later.  YOU SHOULD EXPECT: Some feelings of bloating in the abdomen. Passage of more gas than usual.  Walking can help get rid of the air that was put into your GI tract during the procedure and reduce the bloating. If you had a lower endoscopy (such as a colonoscopy or flexible sigmoidoscopy) you may notice spotting of blood in your stool or on the toilet paper. If you underwent a bowel prep for your procedure, you may not have a normal bowel movement for a few days.  Please Note:  You might notice some irritation and congestion in your nose or some drainage.  This is from the oxygen used during your procedure.  There is no need for concern and it should clear up in a day or so.  SYMPTOMS TO REPORT IMMEDIATELY:   Following lower endoscopy (colonoscopy or flexible sigmoidoscopy):  Excessive amounts of blood in the stool  Significant tenderness or worsening of abdominal pains  Swelling of the abdomen that is new, acute  Fever of 100F or higher  For urgent or emergent issues, a gastroenterologist can be reached at any hour by calling [phone].   DIET:  We do recommend a small meal at first, but then you may proceed to your regular diet.  Drink plenty of fluids but you should avoid alcoholic beverages for 24  hours.  ACTIVITY:  You should plan to take it easy for the rest of today and you should NOT DRIVE or use heavy machinery until tomorrow (because of the sedation medicines used during the test).    FOLLOW UP: Our staff will call the number listed on your records the next business day following your procedure to check on you and address any questions or concerns that you may have regarding the information given to you following your procedure. If we do not reach you, we will leave a message.  However, if you are feeling well and you are not experiencing any problems, there is no need to return our call.  We will assume that you have returned to your regular daily activities without incident.  If any biopsies were taken you will be contacted by phone or by letter within the next 1-3 weeks.  Please call us at [phone] if you have not heard about the biopsies in 3 weeks.   Repeat Colonoscopy screening in 3 years Diverticulosis (handout given)  SIGNATURES/CONFIDENTIALITY: You and/or your care partner have signed paperwork which will be entered into your electronic medical record.  These signatures attest to the fact that that the information above on your After Visit Summary has been reviewed and is understood.  Full responsibility of the confidentiality of this discharge information lies with you and/or your care-partner.

## 2017-08-20 NOTE — Op Note (Signed)
[place] Patient Name: Linda Grant Procedure Date: 08/20/2017 10:15 AM MRN: [id number] Endoscopist: Gatha Mayer , MD Age: 76 Referring MD:  Date of Birth: [birth year] Gender: Female Account #: [id number] Procedure:                Colonoscopy Indications:              High risk colon cancer surveillance: Personal                            history of colon cancer, Last colonoscopy: August                            2015 Medicines:                Propofol per Anesthesia, Monitored Anesthesia Care Procedure:                Pre-Anesthesia Assessment:                           - Prior to the procedure, a History and Physical                            was performed, and patient medications and                            allergies were reviewed. The patient's tolerance of                            previous anesthesia was also reviewed. The risks                            and benefits of the procedure and the sedation                            options and risks were discussed with the patient.                            All questions were answered, and informed consent                            was obtained. Prior Anticoagulants: The patient has                            taken no previous anticoagulant or antiplatelet                            agents. ASA Grade Assessment: II - A patient with                            mild systemic disease. After reviewing the risks                            and benefits, the patient was deemed in  satisfactory condition to undergo the procedure.                           After obtaining informed consent, the colonoscope                            was passed under direct vision. Throughout the                            procedure, the patient's blood pressure, pulse, and                            oxygen saturations were monitored continuously. The                            Colonoscope was introduced  through the anus and                            advanced to the the ileocolonic anastomosis. The                            colonoscopy was performed without difficulty. The                            patient tolerated the procedure well. The quality                            of the bowel preparation was excellent. The bowel                            preparation used was Miralax. The rectum and                            Ileocolonic anastomsis areas were photographed. Scope In: 10:26:57 AM Scope Out: 10:32:42 AM Scope Withdrawal Time: 0 hours 4 minutes 6 seconds  Total Procedure Duration: 0 hours 5 minutes 45 seconds  Findings:                 The perianal and digital rectal examinations were                            normal.                           There was evidence of a prior side-to-side                            ileo-colonic anastomosis in the transverse colon.                            This was patent and was characterized by healthy                            appearing mucosa.  Multiple small and large-mouthed diverticula were                            found in the sigmoid colon.                           Internal hemorrhoids were found during retroflexion.                           The exam was otherwise without abnormality on                            direct and retroflexion views. Complications:            No immediate complications. Estimated Blood Loss:     Estimated blood loss: none. Impression:               - Patent side-to-side ileo-colonic anastomosis,                            characterized by healthy appearing mucosa.                           - Diverticulosis in the sigmoid colon.                           - Internal hemorrhoids.                           - The examination was otherwise normal on direct                            and retroflexion views.                           - No specimens collected. Recommendation:           -  Patient has a contact number available for                            emergencies. The signs and symptoms of potential                            delayed complications were discussed with the                            patient. Return to normal activities tomorrow.                            Written discharge instructions were provided to the                            patient.                           - Resume previous diet.                           -  Continue present medications.                           - Repeat colonoscopy in 3 years for surveillance.                            She has personal hx and strong FHx colon cancer and                            other cancers Gatha Mayer, MD 08/20/2017 10:38:39 AM This report has been signed electronically.

## 2017-08-21 ENCOUNTER — Telehealth: Payer: Self-pay | Admitting: *Deleted

## 2017-08-21 NOTE — Telephone Encounter (Signed)
  Follow up Call-  Call back number 08/20/2017 11/01/2016  Post procedure Call Back phone  # [phone]  Permission to leave phone message Yes Yes  Some recent data might be hidden     Patient questions:  Do you have a fever, pain , or abdominal swelling? No. Pain Score  0 *  Have you tolerated food without any problems? Yes.    Have you been able to return to your normal activities? Yes.    Do you have any questions about your discharge instructions: Diet   No. Medications  No. Follow up visit  No.  Do you have questions or concerns about your Care? No.  Actions: * If pain score is 4 or above: No action needed, pain <4.

## 2018-02-14 ENCOUNTER — Other Ambulatory Visit: Payer: Self-pay | Admitting: Family Medicine

## 2018-02-14 DIAGNOSIS — Z1231 Encounter for screening mammogram for malignant neoplasm of breast: Secondary | ICD-10-CM

## 2018-03-31 ENCOUNTER — Ambulatory Visit
Admission: RE | Admit: 2018-03-31 | Discharge: 2018-03-31 | Disposition: A | Discharge status: Home / Self Care | Payer: PRIVATE HEALTH INSURANCE | Source: Ambulatory Visit | Attending: Family Medicine | Admitting: Family Medicine

## 2018-03-31 DIAGNOSIS — Z1231 Encounter for screening mammogram for malignant neoplasm of breast: Secondary | ICD-10-CM

## 2018-07-15 ENCOUNTER — Inpatient Hospital Stay: Payer: PRIVATE HEALTH INSURANCE | Admitting: Oncology

## 2018-07-15 ENCOUNTER — Inpatient Hospital Stay: Payer: PRIVATE HEALTH INSURANCE

## 2018-07-29 ENCOUNTER — Inpatient Hospital Stay (HOSPITAL_BASED_OUTPATIENT_CLINIC_OR_DEPARTMENT_OTHER): Payer: Medicare Other | Admitting: Oncology

## 2018-07-29 ENCOUNTER — Inpatient Hospital Stay: Payer: Medicare Other | Attending: Oncology

## 2018-07-29 ENCOUNTER — Encounter: Payer: Self-pay | Admitting: Oncology

## 2018-07-29 VITALS — BP 111/56 | HR 85 | Temp 98.5°F | Resp 17 | Ht 61.0 in | Wt 144.7 lb

## 2018-07-29 DIAGNOSIS — Z801 Family history of malignant neoplasm of trachea, bronchus and lung: Secondary | ICD-10-CM | POA: Insufficient documentation

## 2018-07-29 DIAGNOSIS — E119 Type 2 diabetes mellitus without complications: Secondary | ICD-10-CM

## 2018-07-29 DIAGNOSIS — Z800 Family history of malignant neoplasm of digestive organs: Secondary | ICD-10-CM

## 2018-07-29 DIAGNOSIS — Z85038 Personal history of other malignant neoplasm of large intestine: Secondary | ICD-10-CM

## 2018-07-29 DIAGNOSIS — C182 Malignant neoplasm of ascending colon: Secondary | ICD-10-CM

## 2018-07-29 DIAGNOSIS — Z9049 Acquired absence of other specified parts of digestive tract: Secondary | ICD-10-CM | POA: Insufficient documentation

## 2018-07-29 LAB — CEA (IN HOUSE-CHCC): CEA (CHCC-IN HOUSE): 2.58 ng/mL (ref 0.00–5.00)

## 2018-07-29 NOTE — Progress Notes (Signed)
  [place] OFFICE PROGRESS NOTE   Diagnosis: Colon cancer  INTERVAL HISTORY:   Ms. [name] returns for a scheduled visit.  She underwent a negative colonoscopy on 08/20/2017.  She feels well.  Good appetite and energy level.  No complaint.  She is being followed at [facility] in a pulmonary fibrosis study.  She plans to have restaging CTs at [facility].  Objective:  Vital signs in last 24 hours:  Blood pressure (!) 111/56, pulse 85, temperature 98.5 F (36.9 C), temperature source Oral, resp. rate 17, height '5\' 1"'$  (1.549 m), weight 144 lb 11.2 oz (65.6 kg), SpO2 98 %.    HEENT: Neck without mass Lymphatics: No cervical, supraclavicular, axillary, or inguinal nodes Resp: Lungs clear bilaterally Cardio: Regular rate and rhythm GI: No hepatosplen megaly, no mass, nontender Vascular: No leg edema  Lab Results:   Lab Results  Component Value Date   CEA1 3.20 07/16/2017   CEA1 3.7 07/16/2017     Medications: I have reviewed the patient's current medications.   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 Preoperative anemia, July 2013  4. Family history of colon/pancreas and lung cancer  5. Family history of pulmonary fibrosis  6. 4 mm subpleural nodule in the lateral basal segment of the left lower lobe on a CT 11/05/2013 at [facility]  7. gastroesophageal reflux disease, biopsy 07/28/2014 at [facility] confirmed Barrett's esophagus  Upper endoscopy November 2017-biopsy negative for Barrett's esophagus 8. Diabetes    Disposition: Linda Grant is in clinical remission from colon cancer.  She is now 6 years out from diagnosis.  She has a good prognosis for a long-term disease-free survival.  She plans to continue clinical follow-up with Dr. Inda Merlin.  She will continue surveillance colonoscopy with Dr. Arelia Longest.  She will undergo CT follow-up via a pulmonary fibrosis study at [facility].  She plans to schedule a CT this year.  [name] is not scheduled for a follow-up appointment at the Cancer center.  I am available to see her in the future as needed.  15 minutes were spent with the patient today.  The majority of the time was used for counseling and coordination of care.  Betsy Coder, MD  07/29/2018  9:39 AM

## 2018-07-31 ENCOUNTER — Telehealth: Payer: Self-pay

## 2018-07-31 NOTE — Telephone Encounter (Addendum)
Pt voiced understanding of message below ----- Message from Linda Pier, MD sent at 07/29/2018  1:34 PM EDT ----- Please call patient, the CEA is normal

## 2018-09-17 ENCOUNTER — Other Ambulatory Visit: Payer: Self-pay | Admitting: Family Medicine

## 2018-09-17 DIAGNOSIS — M8580 Other specified disorders of bone density and structure, unspecified site: Secondary | ICD-10-CM

## 2018-11-19 ENCOUNTER — Ambulatory Visit
Admission: RE | Admit: 2018-11-19 | Discharge: 2018-11-19 | Disposition: A | Discharge status: Home / Self Care | Payer: Medicare Other | Source: Ambulatory Visit | Attending: Family Medicine | Admitting: Family Medicine

## 2018-11-19 DIAGNOSIS — M8580 Other specified disorders of bone density and structure, unspecified site: Secondary | ICD-10-CM

## 2019-06-29 ENCOUNTER — Other Ambulatory Visit: Payer: Self-pay | Admitting: Family Medicine

## 2019-06-29 ENCOUNTER — Other Ambulatory Visit: Payer: Self-pay | Admitting: Psychiatric/Mental Health

## 2019-06-29 DIAGNOSIS — Z1231 Encounter for screening mammogram for malignant neoplasm of breast: Secondary | ICD-10-CM

## 2019-09-15 ENCOUNTER — Ambulatory Visit
Admission: RE | Admit: 2019-09-15 | Discharge: 2019-09-15 | Disposition: A | Discharge status: Home / Self Care | Payer: Medicare Other | Source: Ambulatory Visit | Attending: Family Medicine | Admitting: Family Medicine

## 2019-09-15 ENCOUNTER — Other Ambulatory Visit: Payer: Self-pay

## 2019-09-15 DIAGNOSIS — Z1231 Encounter for screening mammogram for malignant neoplasm of breast: Secondary | ICD-10-CM

## 2019-12-17 ENCOUNTER — Ambulatory Visit: Payer: Medicare Other | Attending: Internal Medicine

## 2019-12-17 DIAGNOSIS — Z23 Encounter for immunization: Secondary | ICD-10-CM | POA: Insufficient documentation

## 2019-12-17 NOTE — Progress Notes (Signed)
   Covid-19 Vaccination Clinic  Name:  LANAYSIA ASHABRANNER    MRN: [id number] DOB: 07-12-1941  12/17/2019  Ms. Taves was observed post Covid-19 immunization for 15 minutes without incidence. She was provided with Vaccine Information Sheet and instruction to access the V-Safe system.   Ms. Kamer was instructed to call 911 with any severe reactions post vaccine: Marland Kitchen Difficulty breathing  . Swelling of your face and throat  . A fast heartbeat  . A bad rash all over your body  . Dizziness and weakness    Immunizations Administered    Name Date Dose VIS Date Route   Pfizer COVID-19 Vaccine 12/17/2019  9:37 AM 0.3 mL 11/13/2019 Intramuscular   Manufacturer: [place]   Lot: [id number]   [place]: [id number]

## 2020-01-07 ENCOUNTER — Ambulatory Visit: Payer: Medicare PPO | Attending: Internal Medicine

## 2020-01-07 DIAGNOSIS — Z23 Encounter for immunization: Secondary | ICD-10-CM | POA: Insufficient documentation

## 2020-01-07 NOTE — Progress Notes (Signed)
   Covid-19 Vaccination Clinic  Name:  DEBBRAH GRADILLAS    MRN: [id number] DOB: 05/11/1941  01/07/2020  Ms. Heyse was observed post Covid-19 immunization for 15 minutes without incidence. She was provided with Vaccine Information Sheet and instruction to access the V-Safe system.   Ms. Bellew was instructed to call 911 with any severe reactions post vaccine: Marland Kitchen Difficulty breathing  . Swelling of your face and throat  . A fast heartbeat  . A bad rash all over your body  . Dizziness and weakness    Immunizations Administered    Name Date Dose VIS Date Route   Pfizer COVID-19 Vaccine 01/07/2020  9:35 AM 0.3 mL 11/13/2019 Intramuscular   Manufacturer: [place]   Lot: [id number]   [place]: [id number]

## 2020-07-15 ENCOUNTER — Encounter: Payer: Self-pay | Admitting: Genetic Counselor

## 2020-08-18 ENCOUNTER — Other Ambulatory Visit: Payer: Self-pay | Admitting: Family Medicine

## 2020-08-18 DIAGNOSIS — Z1231 Encounter for screening mammogram for malignant neoplasm of breast: Secondary | ICD-10-CM

## 2020-09-06 DIAGNOSIS — Z961 Presence of intraocular lens: Secondary | ICD-10-CM | POA: Diagnosis not present

## 2020-09-06 DIAGNOSIS — H5211 Myopia, right eye: Secondary | ICD-10-CM | POA: Diagnosis not present

## 2020-09-06 DIAGNOSIS — H40013 Open angle with borderline findings, low risk, bilateral: Secondary | ICD-10-CM | POA: Diagnosis not present

## 2020-09-15 ENCOUNTER — Ambulatory Visit: Payer: Medicare PPO

## 2020-10-05 DIAGNOSIS — E1169 Type 2 diabetes mellitus with other specified complication: Secondary | ICD-10-CM | POA: Diagnosis not present

## 2020-10-05 DIAGNOSIS — E7800 Pure hypercholesterolemia, unspecified: Secondary | ICD-10-CM | POA: Diagnosis not present

## 2020-10-07 ENCOUNTER — Ambulatory Visit
Admission: RE | Admit: 2020-10-07 | Discharge: 2020-10-07 | Disposition: A | Discharge status: Home / Self Care | Payer: Medicare PPO | Source: Ambulatory Visit | Attending: Family Medicine | Admitting: Family Medicine

## 2020-10-07 ENCOUNTER — Other Ambulatory Visit: Payer: Self-pay

## 2020-10-07 DIAGNOSIS — Z1231 Encounter for screening mammogram for malignant neoplasm of breast: Secondary | ICD-10-CM

## 2020-10-10 DIAGNOSIS — Z836 Family history of other diseases of the respiratory system: Secondary | ICD-10-CM | POA: Diagnosis not present

## 2020-10-10 DIAGNOSIS — Z0000 Encounter for general adult medical examination without abnormal findings: Secondary | ICD-10-CM | POA: Diagnosis not present

## 2020-10-10 DIAGNOSIS — N9089 Other specified noninflammatory disorders of vulva and perineum: Secondary | ICD-10-CM | POA: Diagnosis not present

## 2020-10-10 DIAGNOSIS — Z7984 Long term (current) use of oral hypoglycemic drugs: Secondary | ICD-10-CM | POA: Diagnosis not present

## 2020-10-10 DIAGNOSIS — M8580 Other specified disorders of bone density and structure, unspecified site: Secondary | ICD-10-CM | POA: Diagnosis not present

## 2020-10-10 DIAGNOSIS — I10 Essential (primary) hypertension: Secondary | ICD-10-CM | POA: Diagnosis not present

## 2020-10-10 DIAGNOSIS — Z85038 Personal history of other malignant neoplasm of large intestine: Secondary | ICD-10-CM | POA: Diagnosis not present

## 2020-10-10 DIAGNOSIS — E119 Type 2 diabetes mellitus without complications: Secondary | ICD-10-CM | POA: Diagnosis not present

## 2020-10-10 DIAGNOSIS — E7800 Pure hypercholesterolemia, unspecified: Secondary | ICD-10-CM | POA: Diagnosis not present

## 2020-10-11 DIAGNOSIS — L570 Actinic keratosis: Secondary | ICD-10-CM | POA: Diagnosis not present

## 2020-10-11 DIAGNOSIS — A630 Anogenital (venereal) warts: Secondary | ICD-10-CM | POA: Diagnosis not present

## 2020-10-11 DIAGNOSIS — X32XXXA Exposure to sunlight, initial encounter: Secondary | ICD-10-CM | POA: Diagnosis not present

## 2020-10-20 ENCOUNTER — Encounter: Payer: Self-pay | Admitting: Internal Medicine

## 2020-10-25 ENCOUNTER — Telehealth: Payer: Self-pay | Admitting: Genetic Counselor

## 2020-10-25 NOTE — Telephone Encounter (Signed)
Pt scheduled for 12/20 @ 1 PM

## 2020-10-25 NOTE — Telephone Encounter (Signed)
Returned call.  Patient called to set up additional testing based on letter she received.  Left CB information.

## 2020-11-01 ENCOUNTER — Other Ambulatory Visit: Payer: Self-pay | Admitting: Family Medicine

## 2020-11-01 DIAGNOSIS — A630 Anogenital (venereal) warts: Secondary | ICD-10-CM | POA: Diagnosis not present

## 2020-11-01 DIAGNOSIS — M8580 Other specified disorders of bone density and structure, unspecified site: Secondary | ICD-10-CM

## 2020-11-16 ENCOUNTER — Other Ambulatory Visit: Payer: Self-pay

## 2020-11-16 ENCOUNTER — Ambulatory Visit (AMBULATORY_SURGERY_CENTER): Payer: Self-pay | Admitting: *Deleted

## 2020-11-16 VITALS — Ht 61.0 in | Wt 130.8 lb

## 2020-11-16 DIAGNOSIS — Z85038 Personal history of other malignant neoplasm of large intestine: Secondary | ICD-10-CM

## 2020-11-16 NOTE — Progress Notes (Signed)

## 2020-11-21 ENCOUNTER — Inpatient Hospital Stay: Payer: Medicare PPO

## 2020-11-21 ENCOUNTER — Encounter: Payer: Self-pay | Admitting: Genetic Counselor

## 2020-11-21 ENCOUNTER — Inpatient Hospital Stay: Payer: Medicare PPO | Attending: Genetic Counselor | Admitting: Genetic Counselor

## 2020-11-21 ENCOUNTER — Other Ambulatory Visit: Payer: Self-pay | Admitting: Genetic Counselor

## 2020-11-21 ENCOUNTER — Other Ambulatory Visit: Payer: Self-pay

## 2020-11-21 DIAGNOSIS — Z803 Family history of malignant neoplasm of breast: Secondary | ICD-10-CM

## 2020-11-21 DIAGNOSIS — C182 Malignant neoplasm of ascending colon: Secondary | ICD-10-CM

## 2020-11-21 DIAGNOSIS — Z836 Family history of other diseases of the respiratory system: Secondary | ICD-10-CM | POA: Insufficient documentation

## 2020-11-21 DIAGNOSIS — Z800 Family history of malignant neoplasm of digestive organs: Secondary | ICD-10-CM

## 2020-11-21 LAB — GENETIC SCREENING ORDER

## 2020-11-21 NOTE — Progress Notes (Signed)
REFERRING PROVIDER: Ladell Pier, MD [address] [place],  [place] [address]  PRIMARY PROVIDER:  Glenis Smoker, MD  PRIMARY REASON FOR VISIT:  1. Malignant neoplasm of ascending colon ([place])   2. Family history of colon cancer   3. Family history of breast cancer   4. Family history of pulmonary fibrosis      HISTORY OF PRESENT ILLNESS:   Linda Grant, a 79 y.o. female, was seen for a [facility] cancer genetics consultation at the request of Dr. Benay Spice due to a personal and family history of colon and other cancers.  Linda Grant presents to clinic today to discuss the possibility of a hereditary predisposition to cancer, genetic testing, and to further clarify her future cancer risks, as well as potential cancer risks for family members.   In 2013, at the age of 89, Linda Grant was diagnosed with colon cancer.  IHC loss of MLH1 and PMS2 suggested that she could have Lynch syndrome.Previous genetic testing did not identify hereditary mutations within these genes.    CANCER HISTORY:  Oncology History   No history exists.     RISK FACTORS:  Menarche was at age 65.  First live birth at age N/A.  OCP use for approximately 0 years.  Ovaries intact: yes.  Hysterectomy: no.  Menopausal status: postmenopausal.  HRT use: 0 years. Colonoscopy: yes; <5 polyps. Mammogram within the last year: yes. Number of breast biopsies: 0. Up to date with pelvic exams: yes. Any excessive radiation exposure in the past: no  Past Medical History:  Diagnosis Date  . 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 Anemia   . Arthritis    rt hip  . Colon cancer ([place])   . Diabetes mellitus ([place])    type 2 - controlled  . Diverticulosis   . Elevated cholesterol   . Family history of breast cancer   . Family history of colon cancer   . Family history of pulmonary fibrosis   . GERD (gastroesophageal reflux disease)   . Heart murmur   . Hepatic lesion   . Hx of adenomatous colonic  polyps 07/02/12  . Hypertension   . Internal hemorrhoids   . Mitral valve prolapse   . Ventral hernia   . Vitamin D deficiency     Past Surgical History:  Procedure Laterality Date  . CATARACT EXTRACTION  07/2017  . COLON SURGERY    . COLONOSCOPY    . [place]   Dental Implants  . INCISIONAL HERNIA REPAIR N/A 08/29/2016   Procedure: HERNIA REPAIR INCISIONAL;  Surgeon: Erroll Luna, MD;  Location: [facility];  Service: General;  Laterality: N/A;  [place]  . INSERTION OF MESH N/A 08/29/2016   Procedure: INSERTION OF MESH;  Surgeon: Erroll Luna, MD;  Location: [facility];  Service: General;  Laterality: N/A;  INSERTION OF MESH  . MOUTH SURGERY     dental implants  . PARTIAL COLECTOMY  07/28/2012  . VAGINAL HYSTERECTOMY      Social History   Socioeconomic History  . Marital status: Married    Spouse name: Not on file  . Number of children: Not on file  . Years of education: Not on file  . Highest education level: Not on file  Occupational History  . Occupation: retired  Tobacco Use  . Smoking status: Former Smoker    Packs/day: 0.20    Types: Cigarettes    Quit date: 07/10/1982  Years since quitting: 38.3  . Smokeless tobacco: Never Used  . Tobacco comment: social smoker - only 3-4 cigs in a day and not everyday  Vaping Use  . Vaping Use: Never used  Substance and Sexual Activity  . Alcohol use: Yes    Alcohol/week: 2.0 - 3.0 standard drinks    Types: 2 - 3 Glasses of wine per week    Comment: glass of wine w/ dinner -- 2-3x per week  . Drug use: No  . Sexual activity: Not Currently  Other Topics Concern  . Not on file  Social History Narrative   Retired Tourist information centre manager   No biological children   #3 children she raised and husband has #3 children from his previous marriage   Social Determinants of Radio broadcast assistant Strain: Not on file  Food Insecurity: Not on file  Transportation Needs: Not on  file  Physical Activity: Not on file  Stress: Not on file  Social Connections: Not on file     FAMILY HISTORY:  We obtained a detailed, 4-generation family history.  Significant diagnoses are listed below: Family History  Problem Relation Age of Onset  . Lung cancer Sister 25       smoker  . Diabetes Brother        deceased  . COPD Brother        d. 27  . Diabetes Brother 78  . Pulmonary fibrosis Brother   . Heart disease Father   . Pulmonary fibrosis Father        d. 78  . Colon cancer Sister 24       w/ mets to pancreas at 40y; negative genetic testing (Myriad myRisk in 2014)  . Pulmonary fibrosis Sister        d. 36  . Colon polyps Sister        "benign"; unspecified number  . Breast cancer Maternal Aunt        older than 50y  . Uterine cancer Cousin 18       maternal 1st cousin, once-removed  . Breast cancer Cousin        maternal 1st cousin  . Prostate cancer Cousin        first cousin (he is a maternal and paternal 1st cousin)  . Bladder Cancer Cousin        bladder cancer w/ intestinal involvement; dx 40s-50s  . Pulmonary fibrosis Paternal Uncle   . Colon polyps Other        niece w/ "several polyps"; dx <50y  . Heart attack Maternal Uncle        d. late 50s-early 60s  . Heart attack Maternal Grandmother 89  . Heart attack Maternal Grandfather   . Heart attack Paternal Grandmother        d. 36s-80s  . Heart attack Paternal Grandfather        d. late 70s-early 80s  . Breast cancer Maternal Aunt        dx late 99s  . Diabetes Maternal Aunt   . Heart Problems Maternal Aunt   . Diabetes Maternal Aunt   . Heart Problems Maternal Aunt   . Diabetes Maternal Aunt   . Heart Problems Maternal Aunt   . Heart attack Maternal Uncle   . Heart attack Maternal Uncle   . Melanoma Paternal Uncle   . Leukemia Cousin        paternal 1st cousin, once-removed dx 77s  . Pulmonary fibrosis Cousin  3-4 pat first cousins with IPF  . Esophageal cancer Neg Hx   .  Rectal cancer Neg Hx   . Stomach cancer Neg Hx     Ms. Winner has two full brothers and two full sisters.  One brother is living--currently 92 years of age--and has never had cancer, but was diagnosed with Pulmonary Fibrosis.  One brother died of COPD at 30.  Both of her sisters have passed away.  One sister, a smoker, died of lung cancer at 17.  One sister was diagnosed with colon cancer (also of the cecum) at age 2.  This metastasized to her pancreas at the age of 54.  This sister ultimately passed away from pulmonary fibrosis at the age of 16.  Linda Grant reports that this sister also has a history of additional benign colon polyps, and she had negative Myriad myRisk testing in 2014.  This sister has two daughters, one of whom has a history of "several polyps" diagnosed at age 88 or younger.    Linda Grant mother died at 75 and never had cancer.  She had five full sisters and three full brothers.  Two sisters are still living--both have a history of breast cancer, one diagnosed older than age 40, the other diagnosed in her late 74's.  The former has a granddaughter who was diagnosed with uterine cancer at the age of 34.  The latter has three daughters, one of whom has a history of breast cancer.  Linda Grant maternal uncles mostly passed away from heart attacks.  Three aunts passed away at later ages, due to diabetes and heart problems.  Linda Grant maternal grandmother died of a heart attack at 42.  Her sister married the brother or Linda Grant's father--thus, Linda Grant has a maternal first cousin, once-removed who is also her paternal first cousin.  This cousin has a history of prostate cancer.  Linda Grant maternal grandfather died of a heart attack when she was only 79 years old.  She has no further information for any maternal great aunts/uncles and great grandparents.  Linda Grant father died of pulmonary fibrosis at 70.  He had five full brothers and three full sisters.  His sisters all passed away  mostly later ages, due to age-related causes, with no history of cancer.  One brother had a history of melanoma and passed away in his 15's.  He had one son and one daughter; his son died of a bladder cancer with intestinal involvement in his 68's-50's.  His daughter had a daughter who died of leukemia in her 45's.  Many of Linda Grant's paternal uncles died of pulmonary fibrosis, and they had children with pulmonary fibrosis.  Her paternal grandmother died of a heart attack in her 89's-80's, as did her paternal grandfather.  She has no information for any paternal great aunts/uncles or great grandparents.    Patient's maternal ancestors are of Scotch-Irish descent, and paternal ancestors are of French/Native American descent. There is no reported Ashkenazi Jewish ancestry. There is no known consanguinity.  GENETIC COUNSELING ASSESSMENT: Linda Grant is a 79 y.o. female with a personal and family history of cancer which is somewhat suggestive of a Lynch syndrome and predisposition to cancer given the combination of cancer and early age of onset. We, therefore, discussed and recommended the following at today's visit.   DISCUSSION: We discussed that 5- 7% of colon cancer is hereditary, with most cases associated with Lynch syndrome.  Linda Grant was tested for Lynch syndrome in  the past and was negative, although a VUS was identified in MSH6.  There are other genes that can be associated with hereditary colon cancer syndromes.  These include APC, MUTYH and others.  Recently, RNA testing has allowed [place] to look deeper within the genes, and can sometimes find mutations otherwise unidentified.  We will pursue RNA testing on Linda Grant's test.  We discussed that testing is beneficial for several reasons including knowing how to follow individuals after completing their treatment,and understand if other family members could be at risk for cancer and allow them to undergo genetic testing.   We discussed that 20 - 30% of  Pulmonary Fibrosis is considered familial, however only about 10-20% of familial cases have an identified mutation, with most cases associated with TERT or TERC mutations.  There are other genes that can be associated with hereditary pulmonary fibrosis syndromes.  These include RTEL1, PARN, and others.  These are all genes associated with the stabilization of telomeres, and can also be associated with extrapulmonary findings including dysplastic nails, cancer including leukemia and MD'S, and skin findings.  Linda Grant family is being followed at [facility], but she is unaware of hereditary testing.  She is interested in pursuing this type of testing, however we will consider doing a splice through [facility].  First we will get her above genetic testing back, as TERT and TERT are on that panel. We will continue with testing from [facility], if this testing is negative.  We reviewed the characteristics, features and inheritance patterns of hereditary cancer syndromes. We also discussed genetic testing, including the appropriate family members to test, the process of testing, insurance coverage and turn-around-time for results. We discussed the implications of a negative, positive, carrier and/or variant of uncertain significant result. We recommended Linda Grant pursue genetic testing for the Multi cancer gene panel+RNA.  The Multi-Gene Panel offered by [facility] includes sequencing and/or deletion duplication testing of the following 84 genes: AIP, ALK, APC, ATM, AXIN2,BAP1,  BARD1, BLM, BMPR1A, BRCA1, BRCA2, BRIP1, CASR, CDC73, CDH1, CDK4, CDKN1B, CDKN1C, CDKN2A (p14ARF), CDKN2A (p16INK4a), CEBPA, CHEK2, CTNNA1, DICER1, DIS3L2, EGFR (c.2369C>T, p.Thr790Met variant only), EPCAM (Deletion/duplication testing only), FH, FLCN, GATA2, GPC3, GREM1 (Promoter region deletion/duplication testing only), HOXB13 (c.251G>A, p.Gly84Glu), HRAS, KIT, MAX, MEN1, MET, MITF (c.952G>A, p.Glu318Lys variant only), MLH1, MSH2, MSH3, MSH6, MUTYH,  NBN, NF1, NF2, NTHL1, PALB2, PDGFRA, PHOX2B, PMS2, POLD1, POLE, POT1, PRKAR1A, PTCH1, PTEN, RAD50, RAD51C, RAD51D, RB1, RECQL4, RET, RUNX1, SDHAF2, SDHA (sequence changes only), SDHB, SDHC, SDHD, SMAD4, SMARCA4, SMARCB1, SMARCE1, STK11, SUFU, TERC, TERT, TMEM127, TP53, TSC1, TSC2, VHL, WRN and WT1.    Based on Linda Grant's personal and family history of cancer, she meets medical criteria for genetic testing. Despite that she meets criteria, she may still have an out of pocket cost. We discussed that if her out of pocket cost for testing is over $100, the laboratory will call and confirm whether she wants to proceed with testing.  If the out of pocket cost of testing is less than $100 she will be billed by the genetic testing laboratory.   In order to estimate her chance of having a Lynch syndrome mutation, we used statistical models (MMR Pro) that consider her personal medical history, family history and ancestry.  Because each model is different, there can be a lot of variability in the risks they give.  Therefore, these numbers must be considered a rough range and not a precise risk of having a Lynch syndrome mutation.  These models estimate that  she has approximately a 33.13% chance of having a mutation. Based on this assessment of her family and personal history, genetic testing is recommended.     PLAN: After considering the risks, benefits, and limitations, Ms. Aronov provided informed consent to pursue genetic testing and the blood sample was sent to [facility] for analysis of the Multi cancer panel+RNA. Results should be available within approximately 2-3 weeks' time, at which point they will be disclosed by telephone to Ms. Staten, as will any additional recommendations warranted by these results. Ms. Henken will receive a summary of her genetic counseling visit and a copy of her results once available. This information will also be available in Epic.   Lastly, we encouraged Ms. Fort to  remain in contact with cancer genetics annually so that we can continuously update the family history and inform her of any changes in cancer genetics and testing that may be of benefit for this family.   Ms. Rolfson questions were answered to her satisfaction today. Our contact information was provided should additional questions or concerns arise. Thank you for the referral and allowing [place] to share in the care of your patient.   Saniyyah Elster P. Florene Glen, [place], [facility] Licensed, Insurance risk surveyor [email or web address] phone: [phone]  The patient was seen for a total of 35 minutes in face-to-face genetic counseling.  This patient was discussed with Drs. Magrinat, Lindi Adie and/or Burr Medico who agrees with the above.    _______________________________________________________________________ For Office Staff:  Number of people involved in session: 1 Was an Intern/ student involved with case: no

## 2020-12-05 ENCOUNTER — Telehealth: Payer: Self-pay | Admitting: Genetic Counselor

## 2020-12-05 NOTE — Telephone Encounter (Signed)
LM on VM that results are back and to please call. 

## 2020-12-06 ENCOUNTER — Ambulatory Visit: Payer: Self-pay | Admitting: Genetic Counselor

## 2020-12-06 DIAGNOSIS — Z1379 Encounter for other screening for genetic and chromosomal anomalies: Secondary | ICD-10-CM

## 2020-12-06 NOTE — Progress Notes (Signed)
HPI:  Linda Grant was previously seen in the [place] clinic due to a personal and family history of cancer and family history of Pulmonary Fibrosis and concerns regarding a hereditary predisposition to cancer. Please refer to our prior cancer genetics clinic note for more information regarding our discussion, assessment and recommendations, at the time. Linda. Tays recent genetic test results were disclosed to her, as were recommendations warranted by these results. These results and recommendations are discussed in more detail below.  CANCER HISTORY:  Oncology History  Colon cancer ([place])  07/10/2012 Initial Diagnosis   Colon cancer ([place])   12/04/2020 Genetic Testing   Negative for known pathogenic mutations within any of 32 genes on the Comprehensive Cancer Panel through [facility].  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  The Comprehensive Cancer Panel offered by [facility] Junius Roads, MD) includes sequencing and/or deletion duplication testing of the following 32 genes: APC, ATM, AXIN2, BARD1, BMPR1A, BRCA1, BRCA2, BRIP1, CDH1, CDK4, CDKN2A, CHEK2, EPCAM, FANCC, MLH1, MSH2, MSH6, MUTYH, NBN, PALB2, PMS2, POLD1, POLE, PTEN, RAD51C, RAD51D, SCG5/GREM1, SMAD4, STK11, TP53, VHL, and XRCC2.  Date of report is August 15, 2016.    MSH2 Exons 1-7 Inversion Analysis was negative through [employer].  Date of report is August 15, 2016.  Negative genetic testing on the Multicancer panell +RNA.  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 The Multi-Gene Panel offered by [facility] includes sequencing and/or deletion duplication testing of the following 84 genes: AIP, ALK, APC, ATM, AXIN2,BAP1,  BARD1, BLM, BMPR1A, BRCA1, BRCA2, BRIP1, CASR, CDC73, CDH1, CDK4, CDKN1B, CDKN1C, CDKN2A (p14ARF), CDKN2A (p16INK4a), CEBPA,  CHEK2, CTNNA1, DICER1, DIS3L2, EGFR (c.2369C>T, p.Thr790Met variant only), EPCAM (Deletion/duplication testing only), FH, FLCN, GATA2, GPC3, GREM1 (Promoter region deletion/duplication testing only), HOXB13 (c.251G>A, p.Gly84Glu), HRAS, KIT, MAX, MEN1, MET, MITF (c.952G>A, p.Glu318Lys variant only), MLH1, MSH2, MSH3, MSH6, MUTYH, NBN, NF1, NF2, NTHL1, PALB2, PDGFRA, PHOX2B, PMS2, POLD1, POLE, POT1, PRKAR1A, PTCH1, PTEN, RAD50, RAD51C, RAD51D, RB1, RECQL4, RET, RUNX1, SDHAF2, SDHA (sequence changes only), SDHB, SDHC, SDHD, SMAD4, SMARCA4, SMARCB1, SMARCE1, STK11, SUFU, TERC, TERT, TMEM127, TP53, TSC1, TSC2, VHL, WRN and WT1.  The report date is December 04, 2020.     FAMILY HISTORY:  We obtained a detailed, 4-generation family history.  Significant diagnoses are listed below: Family History  Problem Relation Age of Onset  . Lung cancer Sister 11       smoker  . Diabetes Brother        deceased  . COPD Brother        d. 27  . Diabetes Brother 15  . Pulmonary fibrosis Brother   . Heart disease Father   . Pulmonary fibrosis Father        d. 52  . Colon cancer Sister 39       w/ mets to pancreas at 40y; negative genetic testing (Myriad myRisk in 2014)  . Pulmonary fibrosis Sister        d. 25  . Colon polyps Sister        "benign"; unspecified number  . Breast cancer Maternal Aunt        older than 50y  . Uterine cancer Cousin 18       maternal 1st cousin, once-removed  . Breast cancer Cousin        maternal 1st cousin  . Prostate cancer Cousin  first cousin (he is a maternal and paternal 1st cousin)  . Bladder Cancer Cousin        bladder cancer w/ intestinal involvement; dx 40s-50s  . Pulmonary fibrosis Paternal Uncle   . Colon polyps Other        niece w/ "several polyps"; dx <50y  . Heart attack Maternal Uncle        d. late 50s-early 60s  . Heart attack Maternal Grandmother 89  . Heart attack Maternal Grandfather   . Heart attack Paternal Grandmother        d. 70s-80s   . Heart attack Paternal Grandfather        d. late 70s-early 80s  . Breast cancer Maternal Aunt        dx late 3s  . Diabetes Maternal Aunt   . Heart Problems Maternal Aunt   . Diabetes Maternal Aunt   . Heart Problems Maternal Aunt   . Diabetes Maternal Aunt   . Heart Problems Maternal Aunt   . Heart attack Maternal Uncle   . Heart attack Maternal Uncle   . Melanoma Paternal Uncle   . Leukemia Cousin        paternal 1st cousin, once-removed dx 11s  . Pulmonary fibrosis Cousin        3-4 pat first cousins with IPF  . Esophageal cancer Neg Hx   . Rectal cancer Neg Hx   . Stomach cancer Neg Hx     Linda Grant has two full brothers and two full sisters. One brother is living--currently 16 years of age--and has never had cancer, but was diagnosed with Pulmonary Fibrosis. One brother died of COPD at 74. Both of her sisters have passed away. One sister, a smoker, died of lung cancer at 83. One sister was diagnosed with colon cancer (also of the cecum) at age 64. This metastasized to her pancreas at the age of 43. This sister ultimately passed away from pulmonary fibrosis at the age of 26. Linda Grant reports that this sister also has a history of additional benign colon polyps, and she had negative Myriad myRisk testing in 2014. This sister has two daughters, one of whom has a history of "several polyps" diagnosed at age 51 or younger.   Linda Grant mother died at 32 and never had cancer. She had five full sisters and three full brothers. Two sisters are still living--both have a history of breast cancer, one diagnosed older than age 30, the other diagnosed in her late 97's. The former has a granddaughter who was diagnosed with uterine cancer at the age of 55. The latter has three daughters, one of whom has a history of breast cancer. Linda Grant maternal uncles mostly passed away from heart attacks. Three aunts passed away at later ages, due to diabetes and heart problems. Linda.  Grant maternal grandmother died of a heart attack at 106. Her sister married the brother or Linda Grant's father--thus, Linda Grant has a maternal first cousin, once-removed who is also her paternal first cousin. This cousin has a history of prostate cancer. Linda. Oland maternal grandfather died of a heart attack when she was only 80 years old. She has no further information for any maternal great aunts/uncles and great grandparents.  Linda Grant father died of pulmonary fibrosis at 3. He had five full brothers and three full sisters. His sisters all passed away mostly later ages, due to age-related causes, with no history of cancer. One brother had a history of melanoma and  passed away in his 37's. He had one son and one daughter; his son died of a bladder cancer with intestinal involvement in his 21's-50's. His daughter had a daughter who died of leukemia in her 75's. Many of Linda. Caslin's paternal uncles died of pulmonary fibrosis, and they had children with pulmonary fibrosis. Her paternal grandmother died of a heart attack in her 27's-80's, as did her paternal grandfather. She has no information for any paternal great aunts/uncles or great grandparents.   Patient's maternal ancestors are ofScotch-Irishdescent, and paternal ancestors are of Environmental manager. There is noreported Ashkenazi Jewish ancestry. There is noknown consanguinity.  GENETIC TEST RESULTS: Genetic testing reported out on December 04, 2020 through the Multi-cancer panel+RNA found no pathogenic mutations. The Multi-Gene Panel offered by [facility] includes sequencing and/or deletion duplication testing of the following 84 genes: AIP, ALK, APC, ATM, AXIN2,BAP1,  BARD1, BLM, BMPR1A, BRCA1, BRCA2, BRIP1, CASR, CDC73, CDH1, CDK4, CDKN1B, CDKN1C, CDKN2A (p14ARF), CDKN2A (p16INK4a), CEBPA, CHEK2, CTNNA1, DICER1, DIS3L2, EGFR (c.2369C>T, p.Thr790Met variant only), EPCAM (Deletion/duplication testing only), FH, FLCN, GATA2,  GPC3, GREM1 (Promoter region deletion/duplication testing only), HOXB13 (c.251G>A, p.Gly84Glu), HRAS, KIT, MAX, MEN1, MET, MITF (c.952G>A, p.Glu318Lys variant only), MLH1, MSH2, MSH3, MSH6, MUTYH, NBN, NF1, NF2, NTHL1, PALB2, PDGFRA, PHOX2B, PMS2, POLD1, POLE, POT1, PRKAR1A, PTCH1, PTEN, RAD50, RAD51C, RAD51D, RB1, RECQL4, RET, RUNX1, SDHAF2, SDHA (sequence changes only), SDHB, SDHC, SDHD, SMAD4, SMARCA4, SMARCB1, SMARCE1, STK11, SUFU, TERC, TERT, TMEM127, TP53, TSC1, TSC2, VHL, WRN and WT1. The test report has been scanned into EPIC and is located under the Molecular Pathology section of the Results Review tab.  A portion of the result report is included below for reference.     We discussed with Linda Grant that because current genetic testing is not perfect, it is possible there may be a gene mutation in one of these genes that current testing cannot detect, but that chance is small.  We also discussed, that there could be another gene that has not yet been discovered, or that we have not yet tested, that is responsible for the cancer diagnoses in the family. It is also possible there is a hereditary cause for the cancer in the family that Linda Grant did not inherit and therefore was not identified in her testing.  Therefore, it is important to remain in touch with cancer genetics in the future so that we can continue to offer Linda Grant the most up to date genetic testing.   Genetic testing did identify a variant of uncertain significance (VUS) was identified in the MSH6 gene called c.3260C>A (p.Pro1087His).  At this time, it is unknown if this variant is associated with increased cancer risk or if this is a normal finding, but most variants such as this get reclassified to being inconsequential. It should not be used to make medical management decisions. With time, we suspect the lab will determine the significance of this variant, if any. If we do learn more about it, we will try to contact Linda Grant to  discuss it further. However, it is important to stay in touch with [place] periodically and keep the address and phone number up to date.  ADDITIONAL GENETIC TESTING: We discussed with Linda Grant that her genetic testing was fairly extensive.  If there are genes identified to increase cancer risk that can be analyzed in the future, we would be happy to discuss and coordinate this testing at that time.    Linda Grant was negative for genetic testing on the TERT and  TERC genes, that are commonly associated with hereditary Pulmonary Fibrosis.  There are additional genes that play a role in hereditary Pulmonary Fibrosis that were not tested.  At this time, Linda Grant did not want to pursue further testing, but will see if her brother may want to undergo genetic testing in order to help clarify what may be going on in the family.  CANCER SCREENING RECOMMENDATIONS: Linda Grant test result is considered negative (normal).  This means that we have not identified a hereditary cause for her personal and family history of cancer at this time. Most cancers happen by chance and this negative test suggests that her cancer may fall into this category.    While reassuring, this does not definitively rule out a hereditary predisposition to cancer. It is still possible that there could be genetic mutations that are undetectable by current technology. There could be genetic mutations in genes that have not been tested or identified to increase cancer risk.  Therefore, it is recommended she continue to follow the cancer management and screening guidelines provided by her oncology and primary healthcare provider.   An individual's cancer risk and medical management are not determined by genetic test results alone. Overall cancer risk assessment incorporates additional factors, including personal medical history, family history, and any available genetic information that may result in a personalized plan for cancer prevention and  surveillance  RECOMMENDATIONS FOR FAMILY MEMBERS:  Individuals in this family might be at some increased risk of developing cancer, over the general population risk, simply due to the family history of cancer.  We recommended women in this family have a yearly mammogram beginning at age 88, or 8 years younger than the earliest onset of cancer, an annual clinical breast exam, and perform monthly breast self-exams. Women in this family should also have a gynecological exam as recommended by their primary provider. All family members should be referred for colonoscopy starting at age 54.  It is also possible there is a hereditary cause for the cancer in Linda. Horkey's family that she did not inherit and therefore was not identified in her.  Based on Linda. Greaser's family history, we recommended her brother, who was diagnosed with Pulmonary Fibrosis, have genetic counseling and testing. Linda. Fiero will let us know if we can be of any assistance in coordinating genetic counseling and/or testing for this family member.   FOLLOW-UP: Lastly, we discussed with Linda. Roell that cancer genetics is a rapidly advancing field and it is possible that new genetic tests will be appropriate for her and/or her family members in the future. We encouraged her to remain in contact with cancer genetics on an annual basis so we can update her personal and family histories and let her know of advances in cancer genetics that may benefit this family.   Our contact number was provided. Linda. Cappelletti questions were answered to her satisfaction, and she knows she is welcome to call us at anytime with additional questions or concerns.   Roma Kayser, [facility], [facility] Licensed, Certified Genetic Counselor [email or web address]

## 2020-12-09 ENCOUNTER — Encounter: Payer: Self-pay | Admitting: Internal Medicine

## 2020-12-15 ENCOUNTER — Encounter: Payer: Self-pay | Admitting: Internal Medicine

## 2020-12-15 ENCOUNTER — Other Ambulatory Visit: Payer: Self-pay

## 2020-12-15 ENCOUNTER — Ambulatory Visit (AMBULATORY_SURGERY_CENTER): Payer: Medicare PPO | Admitting: Internal Medicine

## 2020-12-15 VITALS — BP 131/74 | HR 69 | Temp 95.7°F | Resp 13 | Ht 61.0 in | Wt 130.8 lb

## 2020-12-15 DIAGNOSIS — D123 Benign neoplasm of transverse colon: Secondary | ICD-10-CM | POA: Diagnosis not present

## 2020-12-15 DIAGNOSIS — D128 Benign neoplasm of rectum: Secondary | ICD-10-CM | POA: Diagnosis not present

## 2020-12-15 DIAGNOSIS — D129 Benign neoplasm of anus and anal canal: Secondary | ICD-10-CM

## 2020-12-15 DIAGNOSIS — Z85038 Personal history of other malignant neoplasm of large intestine: Secondary | ICD-10-CM | POA: Diagnosis not present

## 2020-12-15 DIAGNOSIS — K635 Polyp of colon: Secondary | ICD-10-CM | POA: Diagnosis not present

## 2020-12-15 MED ORDER — SODIUM CHLORIDE 0.9 % IV SOLN: 500.0000 mL | Freq: Once | INTRAVENOUS | Status: DC

## 2020-12-15 NOTE — Progress Notes (Signed)
Called to room to assist during endoscopic procedure.  Patient ID and intended procedure confirmed with present staff. Received instructions for my participation in the procedure from the performing physician.  

## 2020-12-15 NOTE — Op Note (Signed)
[facility] Patient Name: Linda Grant Procedure Date: 12/15/2020 2:47 PM MRN: [id number] Endoscopist: Gatha Mayer , MD Age: 80 Referring MD:  Date of Birth: 1941/01/14 Gender: Female Account #: [id number] Procedure:                Colonoscopy Indications:              High risk colon cancer surveillance: Personal                            history of colon cancer, polyps and FHx CRCA Medicines:                Propofol per Anesthesia, Monitored Anesthesia Care Procedure:                Pre-Anesthesia Assessment:                           - Prior to the procedure, a History and Physical                            was performed, and patient medications and                            allergies were reviewed. The patient's tolerance of                            previous anesthesia was also reviewed. The risks                            and benefits of the procedure and the sedation                            options and risks were discussed with the patient.                            All questions were answered, and informed consent                            was obtained. Prior Anticoagulants: The patient has                            taken no previous anticoagulant or antiplatelet                            agents. ASA Grade Assessment: III - A patient with                            severe systemic disease. After reviewing the risks                            and benefits, the patient was deemed in                            satisfactory condition to undergo the procedure.  After obtaining informed consent, the colonoscope                            was passed under direct vision. Throughout the                            procedure, the patient's blood pressure, pulse, and                            oxygen saturations were monitored continuously. The                            Olympus [id number]-[id number] (#[id number]) Colonoscope was                             introduced through the anus and advanced to the the                            ileocolonic anastomosis. The colonoscopy was                            performed without difficulty. The patient tolerated                            the procedure well. The quality of the bowel                            preparation was good. The rectum and Ileocolonic                            anastomsis areas were photographed. Scope In: 2:59:59 PM Scope Out: 3:20:19 PM Scope Withdrawal Time: 0 hours 17 minutes 9 seconds  Total Procedure Duration: 0 hours 20 minutes 20 seconds  Findings:                 The perianal and digital rectal examinations were                            normal.                           A 12 mm polyp was found in the transverse colon.                            The polyp was semi-pedunculated. The polyp was                            removed with a hot snare. Resection and retrieval                            were complete. Verification of patient                            identification for the specimen was done. To  prevent bleeding after the polypectomy, one                            hemostatic clip was successfully placed (MR                            conditional). There was no bleeding at the end of                            the procedure.                           Two flat and sessile polyps were found in the                            rectum and anastomosis. The polyps were 2 to 8 mm                            in size. These polyps were removed with a cold                            snare. Resection and retrieval were complete.                            Verification of patient identification for the                            specimen was done. Estimated blood loss was minimal.                           Many diverticula were found in the sigmoid colon.                           The exam was otherwise without abnormality on                             direct and retroflexion views. Complications:            No immediate complications. Estimated Blood Loss:     Estimated blood loss was minimal. Impression:               - One 12 mm polyp in the transverse colon, removed                            with a hot snare. Resected and retrieved. Clip (MR                            conditional) was placed.                           - Two 2 to 8 mm polyps in the rectum and at the                            anastomosis, removed with a  cold snare. Resected                            and retrieved.                           - Diverticulosis in the sigmoid colon.                           - The examination was otherwise normal on direct                            and retroflexion views.                           - Personal history of colon cancer, polyps and FHx                            CRCA Recommendation:           - Patient has a contact number available for                            emergencies. The signs and symptoms of potential                            delayed complications were discussed with the                            patient. Return to normal activities tomorrow.                            Written discharge instructions were provided to the                            patient.                           - Resume previous diet.                           - Continue present medications.                           - No aspirin, ibuprofen, naproxen, or other                            non-steroidal anti-inflammatory drugs for 2 weeks                            after polyp removal. Gatha Mayer, MD 12/15/2020 3:35:43 PM This report has been signed electronically.

## 2020-12-15 NOTE — Patient Instructions (Addendum)
I found and removed 3 polyps today.  They all look benign.  They were tiny to small in size.  I will get them analyzed and let you know the results as per usual.  You also have diverticulosis as in the past.  I appreciate the opportunity to care for you. Gatha Mayer, MD, [facility]  HANDOUTS PROVIDED ON: POLYPS & DIVERTICULOSIS  The polyps removed today have been sent for pathology.  The results can take 1-3 weeks to receive.  When your next colonoscopy should occur will be based on the pathology results.    You may resume your previous diet and medication schedule.  NO ASPIRIN, IBUPROFEN, NAPROXEN, OR ANY OTHER NSAIDs FOR 2 WEEKS AFTER POLYP REMOVAL!!!  Thank you for allowing [place] to care for you today!!!   YOU HAD AN ENDOSCOPIC PROCEDURE TODAY AT [facility]:   Refer to the procedure report that was given to you for any specific questions about what was found during the examination.  If the procedure report does not answer your questions, please call your gastroenterologist to clarify.  If you requested that your care partner not be given the details of your procedure findings, then the procedure report has been included in a sealed envelope for you to review at your convenience later.  YOU SHOULD EXPECT: Some feelings of bloating in the abdomen. Passage of more gas than usual.  Walking can help get rid of the air that was put into your GI tract during the procedure and reduce the bloating. If you had a lower endoscopy (such as a colonoscopy or flexible sigmoidoscopy) you may notice spotting of blood in your stool or on the toilet paper. If you underwent a bowel prep for your procedure, you may not have a normal bowel movement for a few days.  Please Note:  You might notice some irritation and congestion in your nose or some drainage.  This is from the oxygen used during your procedure.  There is no need for concern and it should clear up in a day or so.  SYMPTOMS TO REPORT  IMMEDIATELY:   Following lower endoscopy (colonoscopy or flexible sigmoidoscopy):  Excessive amounts of blood in the stool  Significant tenderness or worsening of abdominal pains  Swelling of the abdomen that is new, acute  Fever of 100F or higher  For urgent or emergent issues, a gastroenterologist can be reached at any hour by calling [phone]. Do not use MyChart messaging for urgent concerns.    DIET:  We do recommend a small meal at first, but then you may proceed to your regular diet.  Drink plenty of fluids but you should avoid alcoholic beverages for 24 hours.  ACTIVITY:  You should plan to take it easy for the rest of today and you should NOT DRIVE or use heavy machinery until tomorrow (because of the sedation medicines used during the test).    FOLLOW UP: Our staff will call the number listed on your records Monday morning between 7:15 am and 8:15 am to check on you and address any questions or concerns that you may have regarding the information given to you following your procedure. If we do not reach you, we will leave a message.  We will attempt to reach you two times.  During this call, we will ask if you have developed any symptoms of COVID 19. If you develop any symptoms (ie: fever, flu-like symptoms, shortness of breath, cough etc.) before then, please call [phone].  If you test positive for Covid 19 in the 2 weeks post procedure, please call and report this information to [place].    If any biopsies were taken you will be contacted by phone or by letter within the next 1-3 weeks.  Please call us at [phone] if you have not heard about the biopsies in 3 weeks.    SIGNATURES/CONFIDENTIALITY: You and/or your care partner have signed paperwork which will be entered into your electronic medical record.  These signatures attest to the fact that that the information above on your After Visit Summary has been reviewed and is understood.  Full responsibility of the  confidentiality of this discharge information lies with you and/or your care-partner.

## 2020-12-15 NOTE — Progress Notes (Signed)
VS by [facility] [place].  previsit done and pt states no changes to health hx since visit

## 2020-12-15 NOTE — Progress Notes (Signed)
pt tolerated well. VSS. awake and to recovery. Report given to RN.  

## 2020-12-20 ENCOUNTER — Telehealth: Payer: Self-pay

## 2020-12-20 NOTE — Telephone Encounter (Signed)
Covid-19 screening questions   Do you now or have you had a fever in the last 14 days? No.  Do you have any respiratory symptoms of shortness of breath or cough now or in the last 14 days? No. Do you have any family members or close contacts with diagnosed or suspected Covid-19 in the past 14 days? No.  Have you been tested for Covid-19 and found to be positive? No.       Follow up Call-  Call back number 12/15/2020  Post procedure Call Back phone  # [phone]  Permission to leave phone message Yes  Some recent data might be hidden     Patient questions:  Do you have a fever, pain , or abdominal swelling? No. Pain Score  0 *  Have you tolerated food without any problems? Yes.    Have you been able to return to your normal activities? Yes.    Do you have any questions about your discharge instructions: Diet   No. Medications  No. Follow up visit  No.  Do you have questions or concerns about your Care? No.  Actions: * If pain score is 4 or above: No action needed, pain <4.

## 2020-12-23 ENCOUNTER — Encounter: Payer: Self-pay | Admitting: Internal Medicine

## 2020-12-23 DIAGNOSIS — Z86010 Personal history of colonic polyps: Secondary | ICD-10-CM | POA: Insufficient documentation

## 2021-01-02 DIAGNOSIS — N898 Other specified noninflammatory disorders of vagina: Secondary | ICD-10-CM | POA: Diagnosis not present

## 2021-01-30 DIAGNOSIS — R0789 Other chest pain: Secondary | ICD-10-CM | POA: Diagnosis not present

## 2021-01-31 ENCOUNTER — Other Ambulatory Visit: Payer: Self-pay | Admitting: Family Medicine

## 2021-01-31 ENCOUNTER — Ambulatory Visit
Admission: RE | Admit: 2021-01-31 | Discharge: 2021-01-31 | Disposition: A | Discharge status: Home / Self Care | Payer: Medicare PPO | Source: Ambulatory Visit | Attending: Family Medicine | Admitting: Family Medicine

## 2021-01-31 DIAGNOSIS — R0789 Other chest pain: Secondary | ICD-10-CM

## 2021-02-03 ENCOUNTER — Encounter: Payer: Self-pay | Admitting: Genetic Counselor

## 2021-02-09 ENCOUNTER — Other Ambulatory Visit: Payer: Medicare PPO

## 2021-02-20 ENCOUNTER — Other Ambulatory Visit: Payer: Self-pay

## 2021-02-20 ENCOUNTER — Ambulatory Visit
Admission: RE | Admit: 2021-02-20 | Discharge: 2021-02-20 | Disposition: A | Discharge status: Home / Self Care | Payer: Medicare PPO | Source: Ambulatory Visit | Attending: Family Medicine | Admitting: Family Medicine

## 2021-02-20 DIAGNOSIS — Z780 Asymptomatic menopausal state: Secondary | ICD-10-CM | POA: Diagnosis not present

## 2021-02-20 DIAGNOSIS — M8580 Other specified disorders of bone density and structure, unspecified site: Secondary | ICD-10-CM

## 2021-02-20 DIAGNOSIS — M8589 Other specified disorders of bone density and structure, multiple sites: Secondary | ICD-10-CM | POA: Diagnosis not present

## 2021-02-22 DIAGNOSIS — H26492 Other secondary cataract, left eye: Secondary | ICD-10-CM | POA: Diagnosis not present

## 2021-03-22 DIAGNOSIS — H26492 Other secondary cataract, left eye: Secondary | ICD-10-CM | POA: Diagnosis not present

## 2021-04-10 DIAGNOSIS — E7800 Pure hypercholesterolemia, unspecified: Secondary | ICD-10-CM | POA: Diagnosis not present

## 2021-04-10 DIAGNOSIS — D509 Iron deficiency anemia, unspecified: Secondary | ICD-10-CM | POA: Diagnosis not present

## 2021-04-10 DIAGNOSIS — E119 Type 2 diabetes mellitus without complications: Secondary | ICD-10-CM | POA: Diagnosis not present

## 2021-04-10 DIAGNOSIS — I10 Essential (primary) hypertension: Secondary | ICD-10-CM | POA: Diagnosis not present

## 2021-07-13 DIAGNOSIS — M542 Cervicalgia: Secondary | ICD-10-CM | POA: Diagnosis not present

## 2021-07-13 DIAGNOSIS — M2569 Stiffness of other specified joint, not elsewhere classified: Secondary | ICD-10-CM | POA: Diagnosis not present

## 2021-07-13 DIAGNOSIS — R293 Abnormal posture: Secondary | ICD-10-CM | POA: Diagnosis not present

## 2021-07-18 DIAGNOSIS — M2569 Stiffness of other specified joint, not elsewhere classified: Secondary | ICD-10-CM | POA: Diagnosis not present

## 2021-07-18 DIAGNOSIS — E118 Type 2 diabetes mellitus with unspecified complications: Secondary | ICD-10-CM | POA: Diagnosis not present

## 2021-07-18 DIAGNOSIS — M6258 Muscle wasting and atrophy, not elsewhere classified, other site: Secondary | ICD-10-CM | POA: Diagnosis not present

## 2021-07-18 DIAGNOSIS — R293 Abnormal posture: Secondary | ICD-10-CM | POA: Diagnosis not present

## 2021-07-18 DIAGNOSIS — M542 Cervicalgia: Secondary | ICD-10-CM | POA: Diagnosis not present

## 2021-07-19 ENCOUNTER — Other Ambulatory Visit: Payer: Medicare PPO

## 2021-07-20 DIAGNOSIS — M542 Cervicalgia: Secondary | ICD-10-CM | POA: Diagnosis not present

## 2021-07-20 DIAGNOSIS — E118 Type 2 diabetes mellitus with unspecified complications: Secondary | ICD-10-CM | POA: Diagnosis not present

## 2021-07-20 DIAGNOSIS — M2569 Stiffness of other specified joint, not elsewhere classified: Secondary | ICD-10-CM | POA: Diagnosis not present

## 2021-07-20 DIAGNOSIS — M6258 Muscle wasting and atrophy, not elsewhere classified, other site: Secondary | ICD-10-CM | POA: Diagnosis not present

## 2021-07-20 DIAGNOSIS — R293 Abnormal posture: Secondary | ICD-10-CM | POA: Diagnosis not present

## 2021-07-23 DIAGNOSIS — N3001 Acute cystitis with hematuria: Secondary | ICD-10-CM | POA: Diagnosis not present

## 2021-07-25 DIAGNOSIS — M2569 Stiffness of other specified joint, not elsewhere classified: Secondary | ICD-10-CM | POA: Diagnosis not present

## 2021-07-25 DIAGNOSIS — R293 Abnormal posture: Secondary | ICD-10-CM | POA: Diagnosis not present

## 2021-07-25 DIAGNOSIS — E118 Type 2 diabetes mellitus with unspecified complications: Secondary | ICD-10-CM | POA: Diagnosis not present

## 2021-07-25 DIAGNOSIS — M6258 Muscle wasting and atrophy, not elsewhere classified, other site: Secondary | ICD-10-CM | POA: Diagnosis not present

## 2021-07-25 DIAGNOSIS — M542 Cervicalgia: Secondary | ICD-10-CM | POA: Diagnosis not present

## 2021-07-27 DIAGNOSIS — R293 Abnormal posture: Secondary | ICD-10-CM | POA: Diagnosis not present

## 2021-07-27 DIAGNOSIS — M542 Cervicalgia: Secondary | ICD-10-CM | POA: Diagnosis not present

## 2021-07-27 DIAGNOSIS — M2569 Stiffness of other specified joint, not elsewhere classified: Secondary | ICD-10-CM | POA: Diagnosis not present

## 2021-07-27 DIAGNOSIS — M6258 Muscle wasting and atrophy, not elsewhere classified, other site: Secondary | ICD-10-CM | POA: Diagnosis not present

## 2021-07-27 DIAGNOSIS — E118 Type 2 diabetes mellitus with unspecified complications: Secondary | ICD-10-CM | POA: Diagnosis not present

## 2021-08-01 DIAGNOSIS — M542 Cervicalgia: Secondary | ICD-10-CM | POA: Diagnosis not present

## 2021-08-01 DIAGNOSIS — E118 Type 2 diabetes mellitus with unspecified complications: Secondary | ICD-10-CM | POA: Diagnosis not present

## 2021-08-01 DIAGNOSIS — M6258 Muscle wasting and atrophy, not elsewhere classified, other site: Secondary | ICD-10-CM | POA: Diagnosis not present

## 2021-08-01 DIAGNOSIS — R293 Abnormal posture: Secondary | ICD-10-CM | POA: Diagnosis not present

## 2021-08-01 DIAGNOSIS — M2569 Stiffness of other specified joint, not elsewhere classified: Secondary | ICD-10-CM | POA: Diagnosis not present

## 2021-08-03 DIAGNOSIS — M542 Cervicalgia: Secondary | ICD-10-CM | POA: Diagnosis not present

## 2021-08-03 DIAGNOSIS — M6258 Muscle wasting and atrophy, not elsewhere classified, other site: Secondary | ICD-10-CM | POA: Diagnosis not present

## 2021-08-03 DIAGNOSIS — R293 Abnormal posture: Secondary | ICD-10-CM | POA: Diagnosis not present

## 2021-08-03 DIAGNOSIS — E118 Type 2 diabetes mellitus with unspecified complications: Secondary | ICD-10-CM | POA: Diagnosis not present

## 2021-08-03 DIAGNOSIS — M2569 Stiffness of other specified joint, not elsewhere classified: Secondary | ICD-10-CM | POA: Diagnosis not present

## 2021-08-08 DIAGNOSIS — M2569 Stiffness of other specified joint, not elsewhere classified: Secondary | ICD-10-CM | POA: Diagnosis not present

## 2021-08-08 DIAGNOSIS — R293 Abnormal posture: Secondary | ICD-10-CM | POA: Diagnosis not present

## 2021-08-08 DIAGNOSIS — M6258 Muscle wasting and atrophy, not elsewhere classified, other site: Secondary | ICD-10-CM | POA: Diagnosis not present

## 2021-08-08 DIAGNOSIS — E118 Type 2 diabetes mellitus with unspecified complications: Secondary | ICD-10-CM | POA: Diagnosis not present

## 2021-08-08 DIAGNOSIS — M542 Cervicalgia: Secondary | ICD-10-CM | POA: Diagnosis not present

## 2021-08-10 DIAGNOSIS — M2569 Stiffness of other specified joint, not elsewhere classified: Secondary | ICD-10-CM | POA: Diagnosis not present

## 2021-08-10 DIAGNOSIS — M542 Cervicalgia: Secondary | ICD-10-CM | POA: Diagnosis not present

## 2021-08-10 DIAGNOSIS — R293 Abnormal posture: Secondary | ICD-10-CM | POA: Diagnosis not present

## 2021-08-10 DIAGNOSIS — M6258 Muscle wasting and atrophy, not elsewhere classified, other site: Secondary | ICD-10-CM | POA: Diagnosis not present

## 2021-08-10 DIAGNOSIS — E118 Type 2 diabetes mellitus with unspecified complications: Secondary | ICD-10-CM | POA: Diagnosis not present

## 2021-10-16 ENCOUNTER — Other Ambulatory Visit: Payer: Self-pay | Admitting: Family Medicine

## 2021-10-16 DIAGNOSIS — Z1231 Encounter for screening mammogram for malignant neoplasm of breast: Secondary | ICD-10-CM

## 2021-10-17 DIAGNOSIS — E538 Deficiency of other specified B group vitamins: Secondary | ICD-10-CM | POA: Diagnosis not present

## 2021-10-17 DIAGNOSIS — D509 Iron deficiency anemia, unspecified: Secondary | ICD-10-CM | POA: Diagnosis not present

## 2021-10-17 DIAGNOSIS — E1169 Type 2 diabetes mellitus with other specified complication: Secondary | ICD-10-CM | POA: Diagnosis not present

## 2021-10-17 DIAGNOSIS — I10 Essential (primary) hypertension: Secondary | ICD-10-CM | POA: Diagnosis not present

## 2021-10-17 DIAGNOSIS — E7800 Pure hypercholesterolemia, unspecified: Secondary | ICD-10-CM | POA: Diagnosis not present

## 2021-10-17 DIAGNOSIS — E119 Type 2 diabetes mellitus without complications: Secondary | ICD-10-CM | POA: Diagnosis not present

## 2021-10-17 DIAGNOSIS — E559 Vitamin D deficiency, unspecified: Secondary | ICD-10-CM | POA: Diagnosis not present

## 2021-10-19 DIAGNOSIS — E119 Type 2 diabetes mellitus without complications: Secondary | ICD-10-CM | POA: Diagnosis not present

## 2021-10-19 DIAGNOSIS — Z85038 Personal history of other malignant neoplasm of large intestine: Secondary | ICD-10-CM | POA: Diagnosis not present

## 2021-10-19 DIAGNOSIS — Z836 Family history of other diseases of the respiratory system: Secondary | ICD-10-CM | POA: Diagnosis not present

## 2021-10-19 DIAGNOSIS — M8580 Other specified disorders of bone density and structure, unspecified site: Secondary | ICD-10-CM | POA: Diagnosis not present

## 2021-10-19 DIAGNOSIS — E7800 Pure hypercholesterolemia, unspecified: Secondary | ICD-10-CM | POA: Diagnosis not present

## 2021-10-19 DIAGNOSIS — Z0000 Encounter for general adult medical examination without abnormal findings: Secondary | ICD-10-CM | POA: Diagnosis not present

## 2021-10-19 DIAGNOSIS — I10 Essential (primary) hypertension: Secondary | ICD-10-CM | POA: Diagnosis not present

## 2021-10-19 DIAGNOSIS — D509 Iron deficiency anemia, unspecified: Secondary | ICD-10-CM | POA: Diagnosis not present

## 2021-10-19 DIAGNOSIS — E1169 Type 2 diabetes mellitus with other specified complication: Secondary | ICD-10-CM | POA: Diagnosis not present

## 2021-11-21 ENCOUNTER — Ambulatory Visit
Admission: RE | Admit: 2021-11-21 | Discharge: 2021-11-21 | Disposition: A | Discharge status: Home / Self Care | Payer: Medicare PPO | Source: Ambulatory Visit | Attending: Family Medicine | Admitting: Family Medicine

## 2021-11-21 DIAGNOSIS — Z1231 Encounter for screening mammogram for malignant neoplasm of breast: Secondary | ICD-10-CM

## 2022-04-17 ENCOUNTER — Other Ambulatory Visit: Payer: Self-pay | Admitting: Family Medicine

## 2022-04-17 ENCOUNTER — Ambulatory Visit
Admission: RE | Admit: 2022-04-17 | Discharge: 2022-04-17 | Disposition: A | Discharge status: Home / Self Care | Payer: Medicare PPO | Source: Ambulatory Visit | Attending: Family Medicine | Admitting: Family Medicine

## 2022-04-17 DIAGNOSIS — M542 Cervicalgia: Secondary | ICD-10-CM

## 2022-04-17 DIAGNOSIS — D509 Iron deficiency anemia, unspecified: Secondary | ICD-10-CM | POA: Diagnosis not present

## 2022-04-17 DIAGNOSIS — E7800 Pure hypercholesterolemia, unspecified: Secondary | ICD-10-CM | POA: Diagnosis not present

## 2022-04-17 DIAGNOSIS — E119 Type 2 diabetes mellitus without complications: Secondary | ICD-10-CM | POA: Diagnosis not present

## 2022-04-17 DIAGNOSIS — I10 Essential (primary) hypertension: Secondary | ICD-10-CM | POA: Diagnosis not present

## 2022-05-18 ENCOUNTER — Other Ambulatory Visit: Payer: Self-pay | Admitting: Family Medicine

## 2022-05-18 DIAGNOSIS — M5412 Radiculopathy, cervical region: Secondary | ICD-10-CM

## 2022-05-18 DIAGNOSIS — M5030 Other cervical disc degeneration, unspecified cervical region: Secondary | ICD-10-CM

## 2022-06-06 ENCOUNTER — Ambulatory Visit
Admission: RE | Admit: 2022-06-06 | Discharge: 2022-06-06 | Disposition: A | Discharge status: Home / Self Care | Payer: Medicare PPO | Source: Ambulatory Visit | Attending: Family Medicine | Admitting: Family Medicine

## 2022-06-06 DIAGNOSIS — M47812 Spondylosis without myelopathy or radiculopathy, cervical region: Secondary | ICD-10-CM | POA: Diagnosis not present

## 2022-06-06 DIAGNOSIS — M5412 Radiculopathy, cervical region: Secondary | ICD-10-CM

## 2022-06-06 DIAGNOSIS — M5030 Other cervical disc degeneration, unspecified cervical region: Secondary | ICD-10-CM

## 2022-06-22 DIAGNOSIS — M47812 Spondylosis without myelopathy or radiculopathy, cervical region: Secondary | ICD-10-CM | POA: Diagnosis not present

## 2022-06-22 DIAGNOSIS — Z6826 Body mass index (BMI) 26.0-26.9, adult: Secondary | ICD-10-CM | POA: Diagnosis not present

## 2022-07-02 ENCOUNTER — Ambulatory Visit (HOSPITAL_BASED_OUTPATIENT_CLINIC_OR_DEPARTMENT_OTHER): Payer: Medicare PPO | Attending: Neurological Surgery | Admitting: Physical Therapy

## 2022-07-02 ENCOUNTER — Other Ambulatory Visit: Payer: Self-pay

## 2022-07-02 DIAGNOSIS — M542 Cervicalgia: Secondary | ICD-10-CM

## 2022-07-02 DIAGNOSIS — M62838 Other muscle spasm: Secondary | ICD-10-CM | POA: Insufficient documentation

## 2022-07-02 DIAGNOSIS — M47812 Spondylosis without myelopathy or radiculopathy, cervical region: Secondary | ICD-10-CM | POA: Diagnosis not present

## 2022-07-02 NOTE — Therapy (Signed)
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   Anemia    Arthritis    rt hip   Colon cancer ([place])    Diabetes mellitus ([place])    type 2 - controlled   Diverticulosis    Elevated cholesterol    Family history of breast cancer    Family history of colon cancer    Family history of pulmonary fibrosis    GERD (gastroesophageal reflux disease)    Heart murmur    Hepatic lesion    Hx of adenomatous colonic polyps 07/02/12   Hypertension    Internal hemorrhoids    Mitral valve prolapse    Ventral hernia    Vitamin D deficiency    Past Surgical History:  Procedure Laterality Date   CATARACT EXTRACTION  07/2017   [place]   Dental Implants   INCISIONAL HERNIA REPAIR N/A 08/29/2016   Procedure: HERNIA REPAIR INCISIONAL;  Surgeon: Erroll Luna, MD;  Location: [facility];  Service: General;  Laterality: N/A;  HERNIA REPAIR INCISIONAL   INSERTION OF MESH N/A 08/29/2016   Procedure: INSERTION OF MESH;  Surgeon: Erroll Luna, MD;  Location: [facility];  Service: General;  Laterality: N/A;  INSERTION OF MESH   MOUTH SURGERY     dental implants   PARTIAL COLECTOMY  07/28/2012   VAGINAL HYSTERECTOMY     Patient Active Problem List   Diagnosis Date Noted   Hx of adenomatous and sessile serrated colonic polyps 12/23/2020   Family history of colon cancer    Family  history of breast cancer    Family history of pulmonary fibrosis    Genetic testing 10/01/2016   Family history of breast cancer in female 10/01/2016   Incisional hernia, without obstruction or gangrene 07/09/2013   History of colon cancer 07/09/2013   Colon cancer ([place]) 07/10/2012   Family history of malignant neoplasm of gastrointestinal tract 06/30/2012   Diabetes mellitus ([place]) 06/30/2012    PCP: Sela Hilding MD  REFERRING PROVIDER: Dr Karle Plumber   REFERRING DIAG: [phone] (ICD-10-CM) - Spondylosis without myelopathy or radiculopathy, cervical region  THERAPY DIAG:  Cervicalgia  Other muscle spasm  Rationale for Evaluation and Treatment Rehabilitation  ONSET DATE: year and a half   SUBJECTIVE:  SUBJECTIVE STATEMENT: The patient has had neck pain for about a year and a half.   PERTINENT HISTORY:  DMII, right hip arthritis; DMIIm            Cataracts   PAIN:  Are you having pain? Yes: NPRS scale: 4/10 at worst with movement  Pain location: Left side in cervical paraspinals  Pain description: aching  Aggravating factors: movement/ turning her head  Relieving factors: has a c-collar; heating collar;    PRECAUTIONS: None  WEIGHT BEARING RESTRICTIONS No  FALLS:  Has patient fallen in last 6 months? No  LIVING ENVIRONMENT: Lives with: OCCUPATION: retired   PLOF: Independent  PATIENT GOALS to have less pain in her neck   OBJECTIVE:   DIAGNOSTIC FINDINGS:  On her phone: multilevel degeneration and spurring    PATIENT SURVEYS:     COGNITION: Overall cognitive status: Within functional limits for tasks assessed   SENSATION: WFL  POSTURE: No Significant postural limitations  PALPATION: Spasming in her upper traps and tightness in her left  parapinals    CERVICAL ROM:   Active ROM A/PROM (deg) eval  Flexion 25  Extension 16  Right lateral flexion Right more restricted   Left lateral flexion   Right rotation 43  Left rotation 40   (Blank rows = not tested)  UPPER EXTREMITY ROM: Mild limitations with bilateral ER behind the head   Active ROM Right eval Left eval  Shoulder flexion    Shoulder extension    Shoulder abduction    Shoulder adduction    Shoulder extension    Shoulder internal rotation    Shoulder external rotation    Elbow flexion    Elbow extension    Wrist flexion    Wrist extension    Wrist ulnar deviation    Wrist radial deviation    Wrist pronation    Wrist supination     (Blank rows = not tested)  UPPER EXTREMITY MMT:  MMT Right eval Left eval  Shoulder flexion 5 5  Shoulder extension    Shoulder abduction 5 5  Shoulder adduction    Shoulder extension    Shoulder internal rotation 5 5  Shoulder external rotation 5 5  Middle trapezius    Lower trapezius    Elbow flexion    Elbow extension    Wrist flexion    Wrist extension    Wrist ulnar deviation    Wrist radial deviation    Wrist pronation    Wrist supination    Grip strength     (Blank rows = not tested)  CERVICAL SPECIAL TESTS:    TODAY'S TREATMENT:  Exercises - Theracane Over Shoulder  - 2 x daily - 7 x weekly - 1-2 min  hold - Seated Upper Trapezius Stretch  - 2 x daily - 7 x weekly - 3 reps - 20 hold - Gentle Levator Scapulae Stretch  - 2 x daily - 7 x weekly - 1 sets - 2 reps - 30 sec  hold - Seated Cervical Rotation AROM  - 3 x daily - 7 x weekly - 1 sets - 3 reps  Trigger point release to upper traps and cervical paraspinals    PATIENT EDUCATION:  Education details: HEP; symptom mangement; benefits and risks of TPDN  Person educated: Patient Education method: Explanation, Demonstration, Tactile cues, Verbal cues, and Handouts Education comprehension: verbalized understanding, returned demonstration,  verbal cues required, tactile cues required, and needs further education   HOME EXERCISE PROGRAM: Access  Code: [email or web address] URL: [email or web address] Date: 07/03/2022 Prepared by: Carolyne Littles  Exercises - Theracane Over Shoulder  - 2 x daily - 7 x weekly - 1-2 min  hold - Seated Upper Trapezius Stretch  - 2 x daily - 7 x weekly - 3 reps - 20 hold - Gentle Levator Scapulae Stretch  - 2 x daily - 7 x weekly - 1 sets - 2 reps - 30 sec  hold - Seated Cervical Rotation AROM  - 3 x daily - 7 x weekly - 1 sets - 3 reps  ASSESSMENT:  CLINICAL IMPRESSION: Patient is a 81 y.o. female who was seen today for physical therapy evaluation and treatment for cervicalgia. Most of her pain is on the left side. She has muscle tightness in bilateral upper traps and into her cervical paraspinals and sub-occipitals. Sh ehas limited cervical rotation to the left and the right. She has pain with movement and when sleeping at night. She would benefit from skilled therapy to develop a long term management plan with her neck and to resolve short term tightness and pain.    OBJECTIVE IMPAIRMENTS decreased ROM, decreased strength, increased fascial restrictions, increased muscle spasms, and pain.   ACTIVITY LIMITATIONS lifting, sleeping, and reach over head  PARTICIPATION LIMITATIONS: cleaning, laundry, shopping, and yard work  PERSONAL FACTORS 1-2 comorbidities: right hip OA   are also affecting patient's functional outcome. Age  REHAB POTENTIAL: Good  CLINICAL DECISION MAKING: Stable/uncomplicated  EVALUATION COMPLEXITY: Low   GOALS: Goals reviewed with patient? Yes  SHORT TERM GOALS: Target date:  8/22  Patient will increase bilateral cervical rotation by 15 degrees  Baseline:  Goal status: INITIAL  2.  Patient will increase cervical flexion and extension by 10 degrees  Baseline:  Goal status: INITIAL  3.  Patient will be independent with basic stretching and strengthening program   Baseline:  Goal status: INITIAL   LONG TERM GOALS: Target date: 08/14/2022  Patient will report a 75% decrease in stiffness and pain when se wakes up in the morning Baseline:  Goal status: INITIAL  2.  Patient will increase bilateral rotation to 60 degrees without pain in order to increase community safety Baseline:  Goal status: INITIAL  3.  Patient will be independent with full strengthening program  Baseline:  Goal status: INITIAL   PLAN: PT FREQUENCY: 1-2x/week  PT DURATION: 6 weeks  PLANNED INTERVENTIONS: Therapeutic exercises, Therapeutic activity, Neuromuscular re-education, Patient/Family education, Self Care, Joint mobilization, Aquatic Therapy, Dry Needling, Spinal mobilization, Cryotherapy, Moist heat, Taping, and Manual therapy  PLAN FOR NEXT SESSION: begin with TPDN to the upper trap cervical paraspinals and sub-occipitals. Patient has had needling to upper trap but no higher. She feels a lot of her tightness is coming from the higher cervical area. Manual therapy to the neck; initiate postural exercises.    Carney Living, PT 07/03/2022, 3:27 PM  Referring diagnosis? M47.812 (ICD-10-CM) - Spondylosis without myelopathy or radiculopathy, cervical region Treatment diagnosis? (if different than referring diagnosis)  What was this (referring dx) caused by? '[]'$  Surgery '[]'$  Fall '[]'$  Ongoing issue '[x]'$  Arthritis '[]'$  Other: ____________  Laterality: '[]'$  Rt '[]'$  Lt '[x]'$  Both  Check all possible CPT codes:  *CHOOSE 10 OR LESS*    '[x]'$  97110 (Therapeutic Exercise)  '[]'$  92507 (SLP Treatment)  '[x]'$  97112 (Neuro Re-ed)   '[]'$  92526 (Swallowing Treatment)   '[]'$  97116 (Gait Training)   '[]'$  89381 (Cognitive Training, 1st 15 minutes) '[x]'$  97140 (Manual Therapy)   '[]'$  97130 (Cognitive Training,  each add'l 15 minutes)  '[]'$  97164 (Re-evaluation)                              '[]'$  Other, List CPT Code ____________  '[x]'$  17356 (Therapeutic Activities)     '[x]'$  97535 (Self Care)   '[]'$  All codes  above (97110 - 97535)  '[]'$  97012 (Mechanical Traction)  '[x]'$  97014 (E-stim Unattended)  '[]'$  97032 (E-stim manual)  '[]'$  97033 (Ionto)  '[x]'$  97035 (Ultrasound) '[]'$  97750 (Physical Performance Training) '[]'$  [id number] (Aquatic Therapy) '[]'$  97016 (Vasopneumatic Device) '[]'$  [id number] (Paraffin) '[]'$  97034 (Contrast Bath) '[]'$  97597 (Wound Care 1st 20 sq cm) '[]'$  97598 (Wound Care each add'l 20 sq cm) '[]'$  97760 (Orthotic Fabrication, Fitting, Training Initial) '[]'$  [id number] (Prosthetic Management and Training Initial) '[]'$  [id number] (Orthotic or Prosthetic Training/ Modification Subsequent)

## 2022-07-03 ENCOUNTER — Encounter (HOSPITAL_BASED_OUTPATIENT_CLINIC_OR_DEPARTMENT_OTHER): Payer: Self-pay | Admitting: Physical Therapy

## 2022-07-08 NOTE — Therapy (Signed)
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   Anemia    Arthritis    rt hip   Colon cancer ([place])    Diabetes mellitus ([place])    type 2 - controlled   Diverticulosis    Elevated cholesterol    Family history of breast cancer    Family history of colon cancer    Family history of pulmonary fibrosis    GERD (gastroesophageal reflux disease)    Heart murmur    Hepatic lesion    Hx of adenomatous colonic polyps 07/02/12   Hypertension    Internal hemorrhoids    Mitral valve prolapse    Ventral hernia    Vitamin D deficiency    Past Surgical History:  Procedure Laterality Date   CATARACT EXTRACTION  07/2017   [place]   Dental Implants   INCISIONAL HERNIA REPAIR N/A 08/29/2016   Procedure: HERNIA REPAIR INCISIONAL;  Surgeon: Erroll Luna, MD;  Location: [facility];  Service: General;  Laterality: N/A;  HERNIA REPAIR INCISIONAL   INSERTION OF MESH N/A 08/29/2016   Procedure: INSERTION OF MESH;  Surgeon: Erroll Luna, MD;  Location: [facility];  Service: General;  Laterality: N/A;  INSERTION OF MESH   MOUTH SURGERY     dental implants   PARTIAL COLECTOMY  07/28/2012   VAGINAL HYSTERECTOMY     Patient Active Problem List   Diagnosis Date Noted   Hx of adenomatous and sessile serrated colonic polyps 12/23/2020   Family history of colon cancer    Family  history of breast cancer    Family history of pulmonary fibrosis    Genetic testing 10/01/2016   Family history of breast cancer in female 10/01/2016   Incisional hernia, without obstruction or gangrene 07/09/2013   History of colon cancer 07/09/2013   Colon cancer ([place]) 07/10/2012   Family history of malignant neoplasm of gastrointestinal tract 06/30/2012   Diabetes mellitus ([place]) 06/30/2012    PCP: Sela Hilding MD  REFERRING PROVIDER: Dr Karle Plumber   REFERRING DIAG: [phone] (ICD-10-CM) - Spondylosis without myelopathy or radiculopathy, cervical region  THERAPY DIAG:  Cervicalgia  Other muscle spasm  Rationale for Evaluation and Treatment Rehabilitation  ONSET DATE: year and a half   SUBJECTIVE:  SUBJECTIVE STATEMENT: Stiff today.   PERTINENT HISTORY:  DMII, right hip arthritis; DMIIm            Cataracts   PAIN:  Are you having pain? Yes: NPRS scale: 4/10 with movement Pain location: Left side in cervical paraspinals  Pain description: aching  Aggravating factors: movement/ turning her head  Relieving factors: has a c-collar; heating collar;    PRECAUTIONS: None  WEIGHT BEARING RESTRICTIONS No  FALLS:  Has patient fallen in last 6 months? No  LIVING ENVIRONMENT: Lives with: OCCUPATION: retired   PLOF: Independent  PATIENT GOALS to have less pain in her neck   OBJECTIVE:   DIAGNOSTIC FINDINGS:  On her phone: multilevel degeneration and spurring    PATIENT SURVEYS:    PALPATION: Spasming in her upper traps and tightness in her left parapinals    CERVICAL ROM:   Active ROM A/PROM (deg) eval  Flexion 25  Extension 16  Right lateral flexion Right more restricted   Left lateral flexion   Right rotation 43  Left rotation 40   (Blank rows =  not tested)  UPPER EXTREMITY ROM: Mild limitations with bilateral ER behind the head   Active ROM Right eval Left eval  Shoulder flexion    Shoulder extension    Shoulder abduction    Shoulder adduction    Shoulder extension    Shoulder internal rotation    Shoulder external rotation    Elbow flexion    Elbow extension    Wrist flexion    Wrist extension    Wrist ulnar deviation    Wrist radial deviation    Wrist pronation    Wrist supination     (Blank rows = not tested)  UPPER EXTREMITY MMT:  MMT Right eval Left eval  Shoulder flexion 5 5  Shoulder extension    Shoulder abduction 5 5  Shoulder adduction    Shoulder extension    Shoulder internal rotation 5 5  Shoulder external rotation 5 5  Middle trapezius    Lower trapezius    Elbow flexion    Elbow extension    Wrist flexion    Wrist extension    Wrist ulnar deviation    Wrist radial deviation    Wrist pronation    Wrist supination    Grip strength     (Blank rows = not tested)  CERVICAL SPECIAL TESTS:    TODAY'S TREATMENT:   Treatment                            07/09/22: Trigger Point Dry Needling, Manual Therapy Treatment:  Initial or subsequent education regarding Trigger Point Dry Needling: Subsequent Did patient give consent to treatment with Trigger Point Dry Needling: Yes TPDN with skilled palpation and monitoring followed by STM to the following muscles: bil suboccipitals & upper traps MANUAL: prone thoracic & rib PA, review of use of theracane  Seated cervical rotation with sheet Seated & standing postural alignment Row blue tband ER red tband     PATIENT EDUCATION:  Education details: HEP; symptom mangement; benefits and risks of TPDN  Person educated: Patient Education method: Explanation, Demonstration, Tactile cues, Verbal cues, and Handouts Education comprehension: verbalized understanding, returned demonstration, verbal cues required, tactile cues required, and needs further  education   HOME EXERCISE PROGRAM: Access Code: [facility] URL: [email or web address]  ASSESSMENT:  CLINICAL IMPRESSION:  38 deg to the left and 42 deg to the right following manual  therapy. Encouraged upright posture as well as allowing cervical rotation when turning to look vs staying stiff and using whole body.       OBJECTIVE IMPAIRMENTS decreased ROM, decreased strength, increased fascial restrictions, increased muscle spasms, and pain.   ACTIVITY LIMITATIONS lifting, sleeping, and reach over head  PARTICIPATION LIMITATIONS: cleaning, laundry, shopping, and yard work  PERSONAL FACTORS 1-2 comorbidities: right hip OA   are also affecting patient's functional outcome. Age  REHAB POTENTIAL: Good  CLINICAL DECISION MAKING: Stable/uncomplicated  EVALUATION COMPLEXITY: Low   GOALS: Goals reviewed with patient? Yes  SHORT TERM GOALS: Target date:  8/22  Patient will increase bilateral cervical rotation by 15 degrees  Baseline:  Goal status: INITIAL  2.  Patient will increase cervical flexion and extension by 10 degrees  Baseline:  Goal status: INITIAL  3.  Patient will be independent with basic stretching and strengthening program  Baseline:  Goal status: INITIAL   LONG TERM GOALS: Target date: 08/14/2022  Patient will report a 75% decrease in stiffness and pain when se wakes up in the morning Baseline:  Goal status: INITIAL  2.  Patient will increase bilateral rotation to 60 degrees without pain in order to increase community safety Baseline:  Goal status: INITIAL  3.  Patient will be independent with full strengthening program  Baseline:  Goal status: INITIAL   PLAN: PT FREQUENCY: 1-2x/week  PT DURATION: 6 weeks  PLANNED INTERVENTIONS: Therapeutic exercises, Therapeutic activity, Neuromuscular re-education, Patient/Family education, Self Care, Joint mobilization, Aquatic Therapy, Dry Needling, Spinal mobilization, Cryotherapy, Moist  heat, Taping, and Manual therapy  PLAN FOR NEXT SESSION: outcome of DN? Progress periscap region   [place] C. Jeptha Hinnenkamp PT, DPT 07/09/22 10:34 AM   Referring diagnosis? M47.812 (ICD-10-CM) - Spondylosis without myelopathy or radiculopathy, cervical region Treatment diagnosis? (if different than referring diagnosis)  What was this (referring dx) caused by? '[]'$  Surgery '[]'$  Fall '[]'$  Ongoing issue '[x]'$  Arthritis '[]'$  Other: ____________  Laterality: '[]'$  Rt '[]'$  Lt '[x]'$  Both  Check all possible CPT codes:  *CHOOSE 10 OR LESS*    '[x]'$  97110 (Therapeutic Exercise)  '[]'$  92507 (SLP Treatment)  '[x]'$  97112 (Neuro Re-ed)   '[]'$  92526 (Swallowing Treatment)   '[]'$  97116 (Gait Training)   '[]'$  [id number] (Cognitive Training, 1st 15 minutes) '[x]'$  97140 (Manual Therapy)   '[]'$  97130 (Cognitive Training, each add'l 15 minutes)  '[]'$  97164 (Re-evaluation)                              '[]'$  Other, List CPT Code ____________  '[x]'$  97530 (Therapeutic Activities)     '[x]'$  97535 (Self Care)   '[]'$  All codes above (97110 - 97535)  '[]'$  97012 (Mechanical Traction)  '[x]'$  97014 (E-stim Unattended)  '[]'$  97032 (E-stim manual)  '[]'$  16606 (Ionto)  '[x]'$  97035 (Ultrasound) '[]'$  97750 (Physical Performance Training) '[]'$  [id number] (Aquatic Therapy) '[]'$  97016 (Vasopneumatic Device) '[]'$  [id number] (Paraffin) '[]'$  97034 (Contrast Bath) '[]'$  97597 (Wound Care 1st 20 sq cm) '[]'$  97598 (Wound Care each add'l 20 sq cm) '[]'$  97760 (Orthotic Fabrication, Fitting, Training Initial) '[]'$  [id number] (Prosthetic Management and Training Initial) '[]'$  [id number] (Orthotic or Prosthetic Training/ Modification Subsequent)

## 2022-07-09 ENCOUNTER — Ambulatory Visit (HOSPITAL_BASED_OUTPATIENT_CLINIC_OR_DEPARTMENT_OTHER): Payer: Medicare PPO | Attending: Neurological Surgery | Admitting: Physical Therapy

## 2022-07-09 ENCOUNTER — Encounter (HOSPITAL_BASED_OUTPATIENT_CLINIC_OR_DEPARTMENT_OTHER): Payer: Self-pay | Admitting: Physical Therapy

## 2022-07-09 DIAGNOSIS — M62838 Other muscle spasm: Secondary | ICD-10-CM

## 2022-07-09 DIAGNOSIS — M542 Cervicalgia: Secondary | ICD-10-CM | POA: Diagnosis not present

## 2022-07-25 ENCOUNTER — Encounter (HOSPITAL_BASED_OUTPATIENT_CLINIC_OR_DEPARTMENT_OTHER): Payer: Self-pay | Admitting: Physical Therapy

## 2022-07-25 ENCOUNTER — Ambulatory Visit (HOSPITAL_BASED_OUTPATIENT_CLINIC_OR_DEPARTMENT_OTHER): Payer: Medicare PPO | Admitting: Physical Therapy

## 2022-07-25 DIAGNOSIS — M62838 Other muscle spasm: Secondary | ICD-10-CM | POA: Diagnosis not present

## 2022-07-25 DIAGNOSIS — M542 Cervicalgia: Secondary | ICD-10-CM

## 2022-07-25 NOTE — Therapy (Signed)
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   Anemia    Arthritis    rt hip   Colon cancer ([place])    Diabetes mellitus ([place])    type 2 - controlled   Diverticulosis    Elevated cholesterol    Family history of breast cancer    Family history of colon cancer    Family history of pulmonary fibrosis    GERD (gastroesophageal reflux disease)    Heart murmur    Hepatic lesion    Hx of adenomatous colonic polyps 07/02/12   Hypertension    Internal hemorrhoids    Mitral valve prolapse    Ventral hernia    Vitamin D deficiency    Past Surgical History:  Procedure Laterality Date   CATARACT EXTRACTION  07/2017   [place]   Dental Implants   INCISIONAL HERNIA REPAIR N/A 08/29/2016   Procedure: HERNIA REPAIR INCISIONAL;  Surgeon: Erroll Luna, MD;  Location: [facility];  Service: General;  Laterality: N/A;  HERNIA REPAIR INCISIONAL   INSERTION OF MESH N/A 08/29/2016   Procedure: INSERTION OF MESH;  Surgeon: Erroll Luna, MD;  Location: [facility];  Service: General;  Laterality: N/A;  INSERTION OF MESH   MOUTH SURGERY     dental implants   PARTIAL COLECTOMY  07/28/2012   VAGINAL HYSTERECTOMY     Patient Active Problem List   Diagnosis Date Noted   Hx of adenomatous and sessile serrated colonic polyps 12/23/2020   Family history of colon cancer     Family history of breast cancer    Family history of pulmonary fibrosis    Genetic testing 10/01/2016   Family history of breast cancer in female 10/01/2016   Incisional hernia, without obstruction or gangrene 07/09/2013   History of colon cancer 07/09/2013   Colon cancer ([place]) 07/10/2012   Family history of malignant neoplasm of gastrointestinal tract 06/30/2012   Diabetes mellitus ([place]) 06/30/2012    PCP: Sela Hilding MD  REFERRING PROVIDER: Dr Karle Plumber   REFERRING DIAG: [phone] (ICD-10-CM) - Spondylosis without myelopathy or radiculopathy, cervical region  THERAPY DIAG:  Cervicalgia  Other muscle spasm  Rationale for Evaluation and Treatment Rehabilitation  ONSET DATE: year and a half   SUBJECTIVE:  SUBJECTIVE STATEMENT: Pt states the needling helped and was sore for about a day "as expected." Pt states that motion is slightly better. Pt is doing the HEP at home. Pt to see MD on 9/4 for injections.  PERTINENT HISTORY:  DMII, right hip arthritis; DMIIm            Cataracts   PAIN:  Are you having pain? No: NPRS scale: 2/10 with movement Pain location: Left side in cervical paraspinals  Pain description: aching  Aggravating factors: movement/ turning her head  Relieving factors: has a c-collar; heating collar;    PRECAUTIONS: None  WEIGHT BEARING RESTRICTIONS No  FALLS:  Has patient fallen in last 6 months? No  LIVING ENVIRONMENT: Lives with: OCCUPATION: retired   PLOF: Independent  PATIENT GOALS to have less pain in her neck   OBJECTIVE:   DIAGNOSTIC FINDINGS:  On her phone: multilevel degeneration and spurring    PATIENT SURVEYS:    PALPATION: Spasming in her upper traps and tightness in her left parapinals    CERVICAL ROM:   Active ROM  A/PROM (deg) eval  Flexion 25  Extension 16  Right lateral flexion Right more restricted   Left lateral flexion   Right rotation 43  Left rotation 40   (Blank rows = not tested)  UPPER EXTREMITY ROM: Mild limitations with bilateral ER behind the head   Active ROM Right eval Left eval  Shoulder flexion    Shoulder extension    Shoulder abduction    Shoulder adduction    Shoulder extension    Shoulder internal rotation    Shoulder external rotation    Elbow flexion    Elbow extension    Wrist flexion    Wrist extension    Wrist ulnar deviation    Wrist radial deviation    Wrist pronation    Wrist supination     (Blank rows = not tested)  UPPER EXTREMITY MMT:  MMT Right eval Left eval  Shoulder flexion 5 5  Shoulder extension    Shoulder abduction 5 5  Shoulder adduction    Shoulder extension    Shoulder internal rotation 5 5  Shoulder external rotation 5 5  Middle trapezius    Lower trapezius    Elbow flexion    Elbow extension    Wrist flexion    Wrist extension    Wrist ulnar deviation    Wrist radial deviation    Wrist pronation    Wrist supination    Grip strength     (Blank rows = not tested)  CERVICAL SPECIAL TESTS:    TODAY'S TREATMENT:   8/23  Trigger Point Dry Needling, Manual Therapy Treatment:  Did patient give consent to treatment with Trigger Point Dry Needling: Yes TPDN with skilled palpation and monitoring followed by STM to the following muscles: L C3-5 paraspinals and multifidi  MANUAL: UPA and CPA grade III C3-7  Supine chin tuck 10x 2s Seated rotational SNAG 10x each Seated chin tuck 10x  Treatment                            07/09/22: Trigger Point Dry Needling, Manual Therapy Treatment:  Initial or subsequent education regarding Trigger Point Dry Needling: Subsequent Did patient give consent to treatment with Trigger Point Dry Needling: Yes TPDN with skilled palpation and monitoring followed by STM to the following  muscles: bil suboccipitals & upper traps MANUAL: prone thoracic & rib PA, review  of use of theracane  Seated cervical rotation with sheet Seated & standing postural alignment Row blue tband ER red tband     PATIENT EDUCATION:  Education details: anatomy, exercise progression, DOMS expectations, frequency of movement,  envelope of function, HEP, POC  Person educated: Patient Education method: Explanation, Demonstration, Tactile cues, Verbal cues, and Handouts Education comprehension: verbalized understanding, returned demonstration, verbal cues required, tactile cues required, and needs further education   HOME EXERCISE PROGRAM: Access Code: [facility] URL: [email or web address]  ASSESSMENT:  CLINICAL IMPRESSION:  Pt gained 20 degs of R and L rotation following manual therapy and exercise. Pt had report of soreness of TPDN but no increase in pain. Pt able to improve ROM by end of session and HEP updated to promote more frequent movement of C/S. Pt had good response to joint mobilizations followed by SNAG. Plan to continue with pain management and improving ROM. Pt would benefit from continued skilled therapy in order to reach goals and maximize functional  C/S strength and ROM for prevention of further functional decline.   OBJECTIVE IMPAIRMENTS decreased ROM, decreased strength, increased fascial restrictions, increased muscle spasms, and pain.   ACTIVITY LIMITATIONS lifting, sleeping, and reach over head  PARTICIPATION LIMITATIONS: cleaning, laundry, shopping, and yard work  PERSONAL FACTORS 1-2 comorbidities: right hip OA   are also affecting patient's functional outcome. Age  REHAB POTENTIAL: Good  CLINICAL DECISION MAKING: Stable/uncomplicated  EVALUATION COMPLEXITY: Low   GOALS: Goals reviewed with patient? Yes  SHORT TERM GOALS: Target date:  8/22  Patient will increase bilateral cervical rotation by 15 degrees  Baseline:  Goal status:  INITIAL  2.  Patient will increase cervical flexion and extension by 10 degrees  Baseline:  Goal status: INITIAL  3.  Patient will be independent with basic stretching and strengthening program  Baseline:  Goal status: INITIAL   LONG TERM GOALS: Target date: 08/14/2022  Patient will report a 75% decrease in stiffness and pain when se wakes up in the morning Baseline:  Goal status: INITIAL  2.  Patient will increase bilateral rotation to 60 degrees without pain in order to increase community safety Baseline:  Goal status: INITIAL  3.  Patient will be independent with full strengthening program  Baseline:  Goal status: INITIAL   PLAN: PT FREQUENCY: 1-2x/week  PT DURATION: 6 weeks  PLANNED INTERVENTIONS: Therapeutic exercises, Therapeutic activity, Neuromuscular re-education, Patient/Family education, Self Care, Joint mobilization, Aquatic Therapy, Dry Needling, Spinal mobilization, Cryotherapy, Moist heat, Taping, and Manual therapy  PLAN FOR NEXT SESSION:  TPDN, joint mobs, C/S ROM, scap/UE strength  Daleen Bo PT, DPT 07/25/22 5:28 PM

## 2022-07-31 ENCOUNTER — Encounter (HOSPITAL_BASED_OUTPATIENT_CLINIC_OR_DEPARTMENT_OTHER): Payer: Self-pay | Admitting: Physical Therapy

## 2022-07-31 ENCOUNTER — Ambulatory Visit (HOSPITAL_BASED_OUTPATIENT_CLINIC_OR_DEPARTMENT_OTHER): Payer: Medicare PPO | Admitting: Physical Therapy

## 2022-07-31 DIAGNOSIS — M62838 Other muscle spasm: Secondary | ICD-10-CM

## 2022-07-31 DIAGNOSIS — M542 Cervicalgia: Secondary | ICD-10-CM

## 2022-07-31 NOTE — Therapy (Signed)
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   Anemia    Arthritis    rt hip   Colon cancer ([place])    Diabetes mellitus ([place])    type 2 - controlled   Diverticulosis    Elevated cholesterol    Family history of breast cancer    Family history of colon cancer    Family history of pulmonary fibrosis    GERD (gastroesophageal reflux disease)    Heart murmur    Hepatic lesion    Hx of adenomatous colonic polyps 07/02/12   Hypertension    Internal hemorrhoids    Mitral valve prolapse    Ventral hernia    Vitamin D deficiency    Past Surgical History:  Procedure Laterality Date   CATARACT EXTRACTION  07/2017   [place]   Dental Implants   INCISIONAL HERNIA REPAIR N/A 08/29/2016   Procedure: HERNIA REPAIR INCISIONAL;  Surgeon: Erroll Luna, MD;  Location: [facility];  Service: General;  Laterality: N/A;  HERNIA REPAIR INCISIONAL   INSERTION OF MESH N/A 08/29/2016   Procedure: INSERTION OF MESH;  Surgeon: Erroll Luna, MD;  Location: [facility];  Service: General;  Laterality: N/A;  INSERTION OF MESH   MOUTH SURGERY     dental implants   PARTIAL COLECTOMY  07/28/2012   VAGINAL HYSTERECTOMY     Patient Active Problem List   Diagnosis Date Noted   Hx of adenomatous and sessile serrated colonic polyps 12/23/2020   Family history of colon cancer     Family history of breast cancer    Family history of pulmonary fibrosis    Genetic testing 10/01/2016   Family history of breast cancer in female 10/01/2016   Incisional hernia, without obstruction or gangrene 07/09/2013   History of colon cancer 07/09/2013   Colon cancer ([place]) 07/10/2012   Family history of malignant neoplasm of gastrointestinal tract 06/30/2012   Diabetes mellitus ([place]) 06/30/2012    PCP: Sela Hilding MD  REFERRING PROVIDER: Dr Karle Plumber   REFERRING DIAG: [phone] (ICD-10-CM) - Spondylosis without myelopathy or radiculopathy, cervical region  THERAPY DIAG:  Cervicalgia  Other muscle spasm  Rationale for Evaluation and Treatment Rehabilitation  ONSET DATE: year and a half   SUBJECTIVE:  SUBJECTIVE STATEMENT: Was maybe less sore and for not as long after last time. I feel like I have more motion and less tightness iinto shoulders but still have the pain at the base of head/top of neck.     PERTINENT HISTORY:  DMII, right hip arthritis; DMIIm            Cataracts   PAIN:  Are you having pain? No: NPRS scale: 3/10 with movement, 1/10 being still Pain location: Left side in cervical paraspinals  Pain description: aching  Aggravating factors: movement/ turning her head  Relieving factors: has a c-collar; heating collar;    PRECAUTIONS: None  WEIGHT BEARING RESTRICTIONS No  FALLS:  Has patient fallen in last 6 months? No  LIVING ENVIRONMENT: Lives with: OCCUPATION: retired   PLOF: Independent  PATIENT GOALS to have less pain in her neck   OBJECTIVE:   DIAGNOSTIC FINDINGS:  On her phone: multilevel degeneration and spurring    PATIENT SURVEYS:    PALPATION: Spasming in her upper traps and tightness in her left parapinals    CERVICAL  ROM:   Active ROM A/PROM (deg) eval AROM 8/29  Flexion 25 32  Extension 16 35  Right lateral flexion Right more restricted  20  Left lateral flexion  20  Right rotation 43 40  Left rotation 40 42   (Blank rows = not tested)    UPPER EXTREMITY MMT:  MMT Right eval Left eval  Shoulder flexion 5 5  Shoulder extension    Shoulder abduction 5 5  Shoulder adduction    Shoulder extension    Shoulder internal rotation 5 5  Shoulder external rotation 5 5  Middle trapezius    Lower trapezius    Elbow flexion    Elbow extension    Wrist flexion    Wrist extension    Wrist ulnar deviation    Wrist radial deviation    Wrist pronation    Wrist supination    Grip strength     (Blank rows = not tested)  CERVICAL SPECIAL TESTS:    TODAY'S TREATMENT:   Treatment                            07/31/22: Trigger Point Dry Needling, Manual Therapy Treatment:  Initial or subsequent education regarding Trigger Point Dry Needling: Subsequent Did patient give consent to treatment with Trigger Point Dry Needling: Yes TPDN with skilled palpation and monitoring followed by STM to the following muscles: bil upper traps MANUAL: prone thoracic & rib PA Ktape bil upper trap inhibition  Seated cervical rotation with ktape on- being aware to avoid shoulder elevation     PATIENT EDUCATION:  Education details: anatomy, exercise progression, DOMS expectations, frequency of movement,  envelope of function, HEP, POC  Person educated: Patient Education method: Explanation, Demonstration, Tactile cues, Verbal cues, and Handouts Education comprehension: verbalized understanding, returned demonstration, verbal cues required, tactile cues required, and needs further education   HOME EXERCISE PROGRAM: Access Code: [facility] URL: [email or web address]  ASSESSMENT:  CLINICAL IMPRESSION: Able to increase cervical rotation to 50 deg bil without pain after needling and with tape to  avoid shoulder elevation.      OBJECTIVE IMPAIRMENTS decreased ROM, decreased strength, increased fascial restrictions, increased muscle spasms, and pain.   ACTIVITY LIMITATIONS lifting, sleeping, and reach over head  PARTICIPATION LIMITATIONS: cleaning, laundry, shopping, and yard work  PERSONAL FACTORS 1-2 comorbidities: right hip OA   are also affecting  patient's functional outcome. Age  REHAB POTENTIAL: Good  CLINICAL DECISION MAKING: Stable/uncomplicated  EVALUATION COMPLEXITY: Low   GOALS: Goals reviewed with patient? Yes  SHORT TERM GOALS: Target date:  8/22  Patient will increase bilateral cervical rotation by 15 degrees  Baseline:  Goal status: ongoing  2.  Patient will increase cervical flexion and extension by 10 degrees  Baseline:  Goal status: achieved  3.  Patient will be independent with basic stretching and strengthening program  Baseline:  Goal status:achieved   LONG TERM GOALS: Target date: 08/14/2022  Patient will report a 75% decrease in stiffness and pain when se wakes up in the morning Baseline:  Goal status: INITIAL  2.  Patient will increase bilateral rotation to 60 degrees without pain in order to increase community safety Baseline:  Goal status: INITIAL  3.  Patient will be independent with full strengthening program  Baseline:  Goal status: INITIAL   PLAN: PT FREQUENCY: 1-2x/week  PT DURATION: 6 weeks  PLANNED INTERVENTIONS: Therapeutic exercises, Therapeutic activity, Neuromuscular re-education, Patient/Family education, Self Care, Joint mobilization, Aquatic Therapy, Dry Needling, Spinal mobilization, Cryotherapy, Moist heat, Taping, and Manual therapy  PLAN FOR NEXT SESSION:  TPDN, joint mobs, C/S ROM, scap/UE strength  Yigit Norkus C. Khadeja Abt PT, DPT 07/31/22 2:06 PM

## 2022-08-09 ENCOUNTER — Ambulatory Visit (HOSPITAL_BASED_OUTPATIENT_CLINIC_OR_DEPARTMENT_OTHER): Payer: Medicare PPO | Attending: Neurological Surgery | Admitting: Physical Therapy

## 2022-08-09 ENCOUNTER — Encounter (HOSPITAL_BASED_OUTPATIENT_CLINIC_OR_DEPARTMENT_OTHER): Payer: Self-pay | Admitting: Physical Therapy

## 2022-08-09 DIAGNOSIS — M542 Cervicalgia: Secondary | ICD-10-CM | POA: Insufficient documentation

## 2022-08-09 DIAGNOSIS — M62838 Other muscle spasm: Secondary | ICD-10-CM | POA: Insufficient documentation

## 2022-08-09 NOTE — Therapy (Signed)
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   Anemia    Arthritis    rt hip   Colon cancer ([place])    Diabetes mellitus ([place])    type 2 - controlled   Diverticulosis    Elevated cholesterol    Family history of breast cancer    Family history of colon cancer    Family history of pulmonary fibrosis    GERD (gastroesophageal reflux disease)    Heart murmur    Hepatic lesion    Hx of adenomatous colonic polyps 07/02/12   Hypertension    Internal hemorrhoids    Mitral valve prolapse    Ventral hernia    Vitamin D deficiency    Past Surgical History:  Procedure Laterality Date   CATARACT EXTRACTION  07/2017   [place]   Dental Implants   INCISIONAL HERNIA REPAIR N/A 08/29/2016   Procedure: HERNIA REPAIR INCISIONAL;  Surgeon: Erroll Luna, MD;  Location: [facility];  Service: General;  Laterality: N/A;  HERNIA REPAIR INCISIONAL   INSERTION OF MESH N/A 08/29/2016   Procedure: INSERTION OF MESH;  Surgeon: Erroll Luna, MD;  Location: [facility];  Service: General;  Laterality: N/A;  INSERTION OF MESH   MOUTH SURGERY     dental implants   PARTIAL COLECTOMY  07/28/2012   VAGINAL HYSTERECTOMY     Patient Active Problem List   Diagnosis Date Noted   Hx of adenomatous and sessile serrated colonic polyps 12/23/2020   Family history of colon cancer     Family history of breast cancer    Family history of pulmonary fibrosis    Genetic testing 10/01/2016   Family history of breast cancer in female 10/01/2016   Incisional hernia, without obstruction or gangrene 07/09/2013   History of colon cancer 07/09/2013   Colon cancer ([place]) 07/10/2012   Family history of malignant neoplasm of gastrointestinal tract 06/30/2012   Diabetes mellitus ([place]) 06/30/2012    PCP: Sela Hilding MD  REFERRING PROVIDER: Dr Karle Plumber   REFERRING DIAG: [phone] (ICD-10-CM) - Spondylosis without myelopathy or radiculopathy, cervical region  THERAPY DIAG:  Cervicalgia  Other muscle spasm  Rationale for Evaluation and Treatment Rehabilitation  ONSET DATE: year and a half   SUBJECTIVE:  SUBJECTIVE STATEMENT: Pt states the muscle spasm is better and is helping but the "source of the pain is not changing." Worst is still 5/10. Pt reports that the "one spot" on the L side of her neck is continuing to bother her despite the needling.     PERTINENT HISTORY:  DMII, right hip arthritis; DMIIm            Cataracts   PAIN:  Are you having pain? No: NPRS scale: 3/10 with movement Pain location: Left side in cervical paraspinals  Pain description: aching  Aggravating factors: movement/ turning her head  Relieving factors: has a c-collar; heating collar;    PRECAUTIONS: None  WEIGHT BEARING RESTRICTIONS No  FALLS:  Has patient fallen in last 6 months? No  LIVING ENVIRONMENT: Lives with: OCCUPATION: retired   PLOF: Independent  PATIENT GOALS to have less pain in her neck   OBJECTIVE:   DIAGNOSTIC FINDINGS:  On her phone: multilevel degeneration and spurring    PATIENT SURVEYS:    PALPATION: Spasming in her upper traps and tightness in her  left parapinals    CERVICAL ROM:   Active ROM A/PROM (deg) eval AROM 8/29  Flexion 25 32  Extension 16 35  Right lateral flexion Right more restricted  20  Left lateral flexion  20  Right rotation 43 40  Left rotation 40 42   (Blank rows = not tested)    UPPER EXTREMITY MMT:  MMT Right eval Left eval  Shoulder flexion 5 5  Shoulder extension    Shoulder abduction 5 5  Shoulder adduction    Shoulder extension    Shoulder internal rotation 5 5  Shoulder external rotation 5 5  Middle trapezius    Lower trapezius    Elbow flexion    Elbow extension    Wrist flexion    Wrist extension    Wrist ulnar deviation    Wrist radial deviation    Wrist pronation    Wrist supination    Grip strength     (Blank rows = not tested)  CERVICAL SPECIAL TESTS:    TODAY'S TREATMENT:   9/7  MANUAL: manual traction 5 min Supine UPA and side glides grade III and grade IV respectively- C3-6 Prone UPA grade IV bilat C3-6  C/S snag 2x10 each way  Treatment                            07/31/22: Trigger Point Dry Needling, Manual Therapy Treatment:  Initial or subsequent education regarding Trigger Point Dry Needling: Subsequent Did patient give consent to treatment with Trigger Point Dry Needling: Yes TPDN with skilled palpation and monitoring followed by STM to the following muscles: bil upper traps MANUAL: prone thoracic & rib PA Ktape bil upper trap inhibition  Seated cervical rotation with ktape on- being aware to avoid shoulder elevation     PATIENT EDUCATION:  Education details: anatomy, exercise progression, DOMS expectations, frequency of movement,  envelope of function, HEP, POC  Person educated: Patient Education method: Explanation, Demonstration, Tactile cues, Verbal cues, and Handouts Education comprehension: verbalized understanding, returned demonstration, verbal cues required, tactile cues required, and needs further education   HOME EXERCISE  PROGRAM: Access Code: [facility] URL: [email or web address]  ASSESSMENT:  CLINICAL IMPRESSION:   Pt with marked improvement in cervical rotation to the L following more aggressive joint mobilizations following pt report of "source area" without relief after a few sessions of TPDN. Pt did gain ~  10-15 deg of rotation bilaterally following session. Pt advised to continue with HEP at this time and future sessions will be a mix of TPDN and joint mobilizations in order to improve cervical function and reduce pain for ADL and daily function. Plan to take formal measurements at next session. Pt would benefit from continued skilled therapy in order to reach goals and maximize functional postural strength and ROM for prevention of further functional decline.   OBJECTIVE IMPAIRMENTS decreased ROM, decreased strength, increased fascial restrictions, increased muscle spasms, and pain.   ACTIVITY LIMITATIONS lifting, sleeping, and reach over head  PARTICIPATION LIMITATIONS: cleaning, laundry, shopping, and yard work  PERSONAL FACTORS 1-2 comorbidities: right hip OA   are also affecting patient's functional outcome. Age  REHAB POTENTIAL: Good  CLINICAL DECISION MAKING: Stable/uncomplicated  EVALUATION COMPLEXITY: Low   GOALS: Goals reviewed with patient? Yes  SHORT TERM GOALS: Target date:  8/22  Patient will increase bilateral cervical rotation by 15 degrees  Baseline:  Goal status: ongoing  2.  Patient will increase cervical flexion and extension by 10 degrees  Baseline:  Goal status: achieved  3.  Patient will be independent with basic stretching and strengthening program  Baseline:  Goal status:achieved   LONG TERM GOALS: Target date: 08/14/2022  Patient will report a 75% decrease in stiffness and pain when se wakes up in the morning Baseline:  Goal status: INITIAL  2.  Patient will increase bilateral rotation to 60 degrees without pain in order to increase community  safety Baseline:  Goal status: INITIAL  3.  Patient will be independent with full strengthening program  Baseline:  Goal status: INITIAL   PLAN: PT FREQUENCY: 1-2x/week  PT DURATION: 6 weeks  PLANNED INTERVENTIONS: Therapeutic exercises, Therapeutic activity, Neuromuscular re-education, Patient/Family education, Self Care, Joint mobilization, Aquatic Therapy, Dry Needling, Spinal mobilization, Cryotherapy, Moist heat, Taping, and Manual therapy  PLAN FOR NEXT SESSION:  TPDN, joint mobs, C/S ROM  Daleen Bo PT, DPT 08/09/22 12:52 PM   Referring diagnosis? M47.812 (ICD-10-CM) - Spondylosis without myelopathy or radiculopathy, cervical region Treatment diagnosis? (if different than referring diagnosis)  What was this (referring dx) caused by? '[]'$  Surgery '[]'$  Fall '[]'$  Ongoing issue '[x]'$  Arthritis '[]'$  Other: ____________   Laterality: '[]'$  Rt '[]'$  Lt '[x]'$  Both   Check all possible CPT codes:             *CHOOSE 10 OR LESS*                          '[x]'$  97110 (Therapeutic Exercise)             '[]'$  92507 (SLP Treatment)  '[x]'$  97112 (Neuro Re-ed)                           '[]'$  92526 (Swallowing Treatment)             '[]'$  97116 (Gait Training)                           '[]'$  [id number] (Cognitive Training, 1st 15 minutes) '[x]'$  97140 (Manual Therapy)                                '[]'$  97130 (Cognitive Training, each add'l 15 minutes)   '[]'$  76283 (Re-evaluation)                              '[]'$   Other, List CPT Code ____________  '[x]'$  00938 (Therapeutic Activities)                                    '[x]'$  18299 (Self Care)                      '[]'$  All codes above (97110 - 97535)            '[]'$  97012 (Mechanical Traction)            '[x]'$  37169 (E-stim Unattended)            '[]'$  97032 (E-stim manual)            '[]'$  97033 (Ionto)            '[x]'$  97035 (Ultrasound) '[]'$  97750 (Physical Performance Training) '[]'$  [id number] (Aquatic Therapy) '[]'$  97016 (Vasopneumatic Device) '[]'$  [id number] (Paraffin) '[]'$  97034 (Contrast Bath) '[]'$   97597 (Wound Care 1st 20 sq cm) '[]'$  97598 (Wound Care each add'l 20 sq cm) '[]'$  97760 (Orthotic Fabrication, Fitting, Training Initial) '[]'$  [id number] (Prosthetic Management and Training Initial) '[]'$  [id number] (Orthotic or Prosthetic Training/ Modification Subsequent)

## 2022-08-14 ENCOUNTER — Encounter (HOSPITAL_BASED_OUTPATIENT_CLINIC_OR_DEPARTMENT_OTHER): Payer: Self-pay | Admitting: Physical Therapy

## 2022-08-14 ENCOUNTER — Ambulatory Visit (HOSPITAL_BASED_OUTPATIENT_CLINIC_OR_DEPARTMENT_OTHER): Payer: Medicare PPO | Admitting: Physical Therapy

## 2022-08-14 DIAGNOSIS — M542 Cervicalgia: Secondary | ICD-10-CM | POA: Diagnosis not present

## 2022-08-14 DIAGNOSIS — M62838 Other muscle spasm: Secondary | ICD-10-CM | POA: Diagnosis not present

## 2022-08-14 NOTE — Therapy (Signed)
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   Anemia    Arthritis    rt hip   Colon cancer ([place])    Diabetes mellitus ([place])    type 2 - controlled   Diverticulosis    Elevated cholesterol    Family history of breast cancer    Family history of colon cancer    Family history of pulmonary fibrosis    GERD (gastroesophageal reflux disease)    Heart murmur    Hepatic lesion    Hx of adenomatous colonic polyps 07/02/12   Hypertension    Internal hemorrhoids    Mitral valve prolapse    Ventral hernia    Vitamin D deficiency    Past Surgical History:  Procedure Laterality Date   CATARACT EXTRACTION  07/2017   [place]   Dental Implants   INCISIONAL HERNIA REPAIR N/A 08/29/2016   Procedure: HERNIA REPAIR INCISIONAL;  Surgeon: Erroll Luna, MD;  Location: [facility];  Service: General;  Laterality: N/A;  HERNIA REPAIR INCISIONAL   INSERTION OF MESH N/A 08/29/2016   Procedure: INSERTION OF MESH;  Surgeon: Erroll Luna, MD;  Location: [facility];  Service: General;  Laterality: N/A;  INSERTION OF MESH   MOUTH SURGERY     dental implants   PARTIAL COLECTOMY  07/28/2012   VAGINAL HYSTERECTOMY     Patient Active Problem List   Diagnosis Date Noted   Hx of adenomatous and sessile serrated colonic polyps 12/23/2020   Family history of colon cancer     Family history of breast cancer    Family history of pulmonary fibrosis    Genetic testing 10/01/2016   Family history of breast cancer in female 10/01/2016   Incisional hernia, without obstruction or gangrene 07/09/2013   History of colon cancer 07/09/2013   Colon cancer ([place]) 07/10/2012   Family history of malignant neoplasm of gastrointestinal tract 06/30/2012   Diabetes mellitus ([place]) 06/30/2012    PCP: Sela Hilding MD  REFERRING PROVIDER: Dr Karle Plumber   REFERRING DIAG: [phone] (ICD-10-CM) - Spondylosis without myelopathy or radiculopathy, cervical region  THERAPY DIAG:  Cervicalgia  Other muscle spasm  Rationale for Evaluation and Treatment Rehabilitation  ONSET DATE: year and a half   SUBJECTIVE:  SUBJECTIVE STATEMENT: Pt states the neck feels better. Pt feels that the joint mobilization was helpful and gained motion after. She states it was sore but has gone away. The pain remains at this time.     PERTINENT HISTORY:  DMII, right hip arthritis; DMIIm            Cataracts   PAIN:  Are you having pain? No: NPRS scale: 3/10 with movement Pain location: Left side in cervical paraspinals  Pain description: aching  Aggravating factors: movement/ turning her head  Relieving factors: has a c-collar; heating collar;    PRECAUTIONS: None  WEIGHT BEARING RESTRICTIONS No  FALLS:  Has patient fallen in last 6 months? No  LIVING ENVIRONMENT: Lives with: OCCUPATION: retired   PLOF: Independent  PATIENT GOALS to have less pain in her neck   OBJECTIVE:   DIAGNOSTIC FINDINGS:  On her phone: multilevel degeneration and spurring    PALPATION: Spasming in her upper traps and tightness in her left parapinals    CERVICAL ROM:   Active ROM A/PROM (deg) eval  AROM 8/29  Flexion 25 32  Extension 16 35  Right lateral flexion Right more restricted  20  Left lateral flexion  20  Right rotation 43 40  Left rotation 40 42   (Blank rows = not tested)    UPPER EXTREMITY MMT:  MMT Right eval Left eval  Shoulder flexion 5 5  Shoulder extension    Shoulder abduction 5 5  Shoulder adduction    Shoulder extension    Shoulder internal rotation 5 5  Shoulder external rotation 5 5  Middle trapezius    Lower trapezius    Elbow flexion    Elbow extension    Wrist flexion    Wrist extension    Wrist ulnar deviation    Wrist radial deviation    Wrist pronation    Wrist supination    Grip strength     (Blank rows = not tested)  CERVICAL SPECIAL TESTS:    TODAY'S TREATMENT:  9/12  Trigger Point Dry-Needling  Treatment instructions: Expect mild to moderate muscle soreness. S/S of pneumothorax if dry needled over a lung field, and to seek immediate medical attention should they occur. Patient verbalized understanding of these instructions and education.   Patient Consent Given: Yes Education (verbally)provided: Yes, previously Muscles treated: bilat C4-5 multifidi and paraspinals Electrical stimulation performed: No Treatment response/outcome: improvement in soft tissue tonicity, small LTR elicited on L    MANUAL: manual traction 5 min Supine UPA and side glides grade III and grade IV respectively- C3-6 Prone UPA grade IV bilat C3-6  Supine chin nod with manual traction 10x Supine chin nod with pillow 10x with 3s hold  9/7  MANUAL: manual traction 5 min Supine UPA and side glides grade III and grade IV respectively- C3-6 Prone UPA grade IV bilat C3-6  C/S snag 2x10 each way  Treatment                            07/31/22: Trigger Point Dry Needling, Manual Therapy Treatment:  Initial or subsequent education regarding Trigger Point Dry Needling: Subsequent Did patient give consent to treatment with Trigger Point Dry Needling:  Yes TPDN with skilled palpation and monitoring followed by STM to the following muscles: bil upper traps MANUAL: prone thoracic & rib PA Ktape bil upper trap inhibition  Seated cervical rotation with ktape on- being aware to avoid shoulder elevation  PATIENT EDUCATION:  Education details: anatomy, postural changes, exercise progression, DOMS expectations, frequency of movement,  envelope of function, HEP, POC  Person educated: Patient Education method: Explanation, Demonstration, Tactile cues, Verbal cues, and Handouts Education comprehension: verbalized understanding, returned demonstration, verbal cues required, tactile cues required, and needs further education   HOME EXERCISE PROGRAM: Access Code: [facility] URL: [email or web address]  ASSESSMENT:  CLINICAL IMPRESSION:   Pt able to gain 10 degrees of R rotation following session as well as 5 degree increase of L rotation. Pt gained bilateral SB motions as well following TPDN and manual therapy. Pt given edu about postural compensations and to begin deep cervical flexion motions to help with cervical joint mobility. Plan to continue with joint mobs and TPDN for improvement of pain and ROM. Pt would benefit from continued skilled therapy in order to reach goals and maximize functional postural strength and ROM for prevention of further functional decline.   OBJECTIVE IMPAIRMENTS decreased ROM, decreased strength, increased fascial restrictions, increased muscle spasms, and pain.   ACTIVITY LIMITATIONS lifting, sleeping, and reach over head  PARTICIPATION LIMITATIONS: cleaning, laundry, shopping, and yard work  PERSONAL FACTORS 1-2 comorbidities: right hip OA   are also affecting patient's functional outcome. Age  REHAB POTENTIAL: Good  CLINICAL DECISION MAKING: Stable/uncomplicated  EVALUATION COMPLEXITY: Low   GOALS: Goals reviewed with patient? Yes  SHORT TERM GOALS: Target date:  8/22  Patient will  increase bilateral cervical rotation by 15 degrees  Baseline:  Goal status: ongoing  2.  Patient will increase cervical flexion and extension by 10 degrees  Baseline:  Goal status: achieved  3.  Patient will be independent with basic stretching and strengthening program  Baseline:  Goal status:achieved   LONG TERM GOALS: Target date: 08/14/2022  Patient will report a 75% decrease in stiffness and pain when se wakes up in the morning Baseline:  Goal status: INITIAL  2.  Patient will increase bilateral rotation to 60 degrees without pain in order to increase community safety Baseline:  Goal status: INITIAL  3.  Patient will be independent with full strengthening program  Baseline:  Goal status: INITIAL   PLAN: PT FREQUENCY: 1-2x/week  PT DURATION: 6 weeks  PLANNED INTERVENTIONS: Therapeutic exercises, Therapeutic activity, Neuromuscular re-education, Patient/Family education, Self Care, Joint mobilization, Aquatic Therapy, Dry Needling, Spinal mobilization, Cryotherapy, Moist heat, Taping, and Manual therapy  PLAN FOR NEXT SESSION:  TPDN, joint mobs, C/S ROM  Daleen Bo PT, DPT 08/14/22 5:28 PM   Referring diagnosis? M47.812 (ICD-10-CM) - Spondylosis without myelopathy or radiculopathy, cervical region Treatment diagnosis? (if different than referring diagnosis)  What was this (referring dx) caused by? '[]'$  Surgery '[]'$  Fall '[]'$  Ongoing issue '[x]'$  Arthritis '[]'$  Other: ____________   Laterality: '[]'$  Rt '[]'$  Lt '[x]'$  Both   Check all possible CPT codes:             *CHOOSE 10 OR LESS*                          '[x]'$  97110 (Therapeutic Exercise)             '[]'$  92507 (SLP Treatment)  '[x]'$  97112 (Neuro Re-ed)                           '[]'$  92526 (Swallowing Treatment)             '[]'$  16109 (Gait Training)                           '[]'$   97129 (Cognitive Training, 1st 15 minutes) '[x]'$  97140 (Manual Therapy)                                '[]'$  97130 (Cognitive Training, each add'l 15 minutes)    '[]'$  97164 (Re-evaluation)                              '[]'$  Other, List CPT Code ____________  '[x]'$  97530 (Therapeutic Activities)                                    '[x]'$  78675 (Self Care)                      '[]'$  All codes above (97110 - 97535)            '[]'$  97012 (Mechanical Traction)            '[x]'$  97014 (E-stim Unattended)            '[]'$  97032 (E-stim manual)            '[]'$  97033 (Ionto)            '[x]'$  97035 (Ultrasound) '[]'$  97750 (Physical Performance Training) '[]'$  [id number] (Aquatic Therapy) '[]'$  97016 (Vasopneumatic Device) '[]'$  [id number] (Paraffin) '[]'$  97034 (Contrast Bath) '[]'$  97597 (Wound Care 1st 20 sq cm) '[]'$  97598 (Wound Care each add'l 20 sq cm) '[]'$  97760 (Orthotic Fabrication, Fitting, Training Initial) '[]'$  [id number] (Prosthetic Management and Training Initial) '[]'$  [id number] (Orthotic or Prosthetic Training/ Modification Subsequent)

## 2022-08-20 DIAGNOSIS — Z6826 Body mass index (BMI) 26.0-26.9, adult: Secondary | ICD-10-CM | POA: Diagnosis not present

## 2022-08-20 DIAGNOSIS — M47812 Spondylosis without myelopathy or radiculopathy, cervical region: Secondary | ICD-10-CM | POA: Diagnosis not present

## 2022-08-21 ENCOUNTER — Encounter (HOSPITAL_BASED_OUTPATIENT_CLINIC_OR_DEPARTMENT_OTHER): Payer: Self-pay | Admitting: Physical Therapy

## 2022-08-21 ENCOUNTER — Ambulatory Visit (HOSPITAL_BASED_OUTPATIENT_CLINIC_OR_DEPARTMENT_OTHER): Payer: Medicare PPO | Admitting: Physical Therapy

## 2022-08-21 DIAGNOSIS — M62838 Other muscle spasm: Secondary | ICD-10-CM | POA: Diagnosis not present

## 2022-08-21 DIAGNOSIS — M542 Cervicalgia: Secondary | ICD-10-CM

## 2022-08-21 NOTE — Therapy (Signed)
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   Anemia    Arthritis    rt hip   Colon cancer ([place])    Diabetes mellitus ([place])    type 2 - controlled   Diverticulosis    Elevated cholesterol    Family history of breast cancer    Family history of colon cancer    Family history of pulmonary fibrosis    GERD (gastroesophageal reflux disease)    Heart murmur    Hepatic lesion    Hx of adenomatous colonic polyps 07/02/12   Hypertension    Internal hemorrhoids    Mitral valve prolapse    Ventral hernia    Vitamin D deficiency    Past Surgical History:  Procedure Laterality Date   CATARACT EXTRACTION  07/2017   [place]   Dental Implants   INCISIONAL HERNIA REPAIR N/A 08/29/2016   Procedure: HERNIA REPAIR INCISIONAL;  Surgeon: Erroll Luna, MD;  Location: [facility];  Service: General;  Laterality: N/A;  HERNIA REPAIR INCISIONAL   INSERTION OF MESH N/A 08/29/2016   Procedure: INSERTION OF MESH;  Surgeon: Erroll Luna, MD;  Location: [facility];  Service: General;  Laterality: N/A;  INSERTION OF MESH   MOUTH SURGERY     dental implants   PARTIAL COLECTOMY  07/28/2012   VAGINAL HYSTERECTOMY     Patient Active Problem List   Diagnosis Date Noted   Hx of adenomatous and sessile serrated colonic polyps 12/23/2020   Family history of colon cancer     Family history of breast cancer    Family history of pulmonary fibrosis    Genetic testing 10/01/2016   Family history of breast cancer in female 10/01/2016   Incisional hernia, without obstruction or gangrene 07/09/2013   History of colon cancer 07/09/2013   Colon cancer ([place]) 07/10/2012   Family history of malignant neoplasm of gastrointestinal tract 06/30/2012   Diabetes mellitus ([place]) 06/30/2012    PCP: Sela Hilding MD  REFERRING PROVIDER: Dr Karle Plumber   REFERRING DIAG: [phone] (ICD-10-CM) - Spondylosis without myelopathy or radiculopathy, cervical region  THERAPY DIAG:  Cervicalgia  Other muscle spasm  Rationale for Evaluation and Treatment Rehabilitation  ONSET DATE: year and a half   SUBJECTIVE:  SUBJECTIVE STATEMENT: Pt states thate the neck is moving better. She has much more rotation than last visit. She still has pain into the one "spot."    PERTINENT HISTORY:  DMII, right hip arthritis; DMIIm            Cataracts   PAIN:  Are you having pain? No: NPRS scale: 3/10 with movement Pain location: Left side in cervical paraspinals  Pain description: aching  Aggravating factors: movement/ turning her head  Relieving factors: has a c-collar; heating collar;    PRECAUTIONS: None  WEIGHT BEARING RESTRICTIONS No  FALLS:  Has patient fallen in last 6 months? No  LIVING ENVIRONMENT: Lives with: OCCUPATION: retired   PLOF: Independent  PATIENT GOALS to have less pain in her neck   OBJECTIVE:   DIAGNOSTIC FINDINGS:  On her phone: multilevel degeneration and spurring    PALPATION: Spasming in her upper traps and tightness in her left parapinals    CERVICAL ROM:   Active ROM A/PROM (deg) eval AROM 8/29  Flexion 25 32  Extension 16 35  Right  lateral flexion Right more restricted  20  Left lateral flexion  20  Right rotation 43 40  Left rotation 40 42   (Blank rows = not tested)    TODAY'S TREATMENT:  9/12  Trigger Point Dry-Needling  Treatment instructions: Expect mild to moderate muscle soreness. S/S of pneumothorax if dry needled over a lung field, and to seek immediate medical attention should they occur. Patient verbalized understanding of these instructions and education.   Patient Consent Given: Yes Education (verbally)provided: Yes, previously Muscles treated: bilat UT Electrical stimulation performed: No Treatment response/outcome: improvement in soft tissue tonicity,large LTR on L- very reactive    MANUAL: manual traction 5 min  Supine UPA and side glides grade III and grade IV respectively- C3-6  Prone UPA grade IV bilat C3-6  Supine chin nod with manual traction 10x   9/7  MANUAL: manual traction 5 min Supine UPA and side glides grade III and grade IV respectively- C3-6 Prone UPA grade IV bilat C3-6  C/S snag 2x10 each way  Treatment                            07/31/22: Trigger Point Dry Needling, Manual Therapy Treatment:  Initial or subsequent education regarding Trigger Point Dry Needling: Subsequent Did patient give consent to treatment with Trigger Point Dry Needling: Yes TPDN with skilled palpation and monitoring followed by STM to the following muscles: bil upper traps MANUAL: prone thoracic & rib PA Ktape bil upper trap inhibition  Seated cervical rotation with ktape on- being aware to avoid shoulder elevation     PATIENT EDUCATION:  Education details: anatomy, postural changes, exercise progression, DOMS expectations, frequency of movement,  envelope of function, HEP, POC  Person educated: Patient Education method: Explanation, Demonstration, Tactile cues, Verbal cues, and Handouts Education comprehension: verbalized understanding, returned demonstration, verbal cues required,  tactile cues required, and needs further education   HOME EXERCISE PROGRAM: Access Code: [facility] URL: [email or web address]  ASSESSMENT:  CLINICAL IMPRESSION:   Pt able to gain ROM with lateral flexion and rotation today with grade IV joint mobilizations. Pt able to improve rotation to at least 2/3 of ROM in seated with ~5 deg of lateral flexion. Plan to continue with joint mobs and TPDN for improvement of pain and ROM. Pt to continue at decreased frequency of visits and to discuss maintenance or preventative visits within  the next 3-4 wks. Pt would benefit from continued skilled therapy in order to reach goals and maximize functional postural strength and ROM for prevention of further functional decline.   OBJECTIVE IMPAIRMENTS decreased ROM, decreased strength, increased fascial restrictions, increased muscle spasms, and pain.   ACTIVITY LIMITATIONS lifting, sleeping, and reach over head  PARTICIPATION LIMITATIONS: cleaning, laundry, shopping, and yard work  PERSONAL FACTORS 1-2 comorbidities: right hip OA   are also affecting patient's functional outcome. Age  REHAB POTENTIAL: Good  CLINICAL DECISION MAKING: Stable/uncomplicated  EVALUATION COMPLEXITY: Low   GOALS: Goals reviewed with patient? Yes  SHORT TERM GOALS: Target date:  8/22  Patient will increase bilateral cervical rotation by 15 degrees  Baseline:  Goal status: ongoing  2.  Patient will increase cervical flexion and extension by 10 degrees  Baseline:  Goal status: achieved  3.  Patient will be independent with basic stretching and strengthening program  Baseline:  Goal status:achieved   LONG TERM GOALS: Target date: 08/14/2022  Patient will report a 75% decrease in stiffness and pain when se wakes up in the morning Baseline:  Goal status: INITIAL  2.  Patient will increase bilateral rotation to 60 degrees without pain in order to increase community safety Baseline:  Goal status:  INITIAL  3.  Patient will be independent with full strengthening program  Baseline:  Goal status: INITIAL   PLAN: PT FREQUENCY: 1-2x/week  PT DURATION: 6 weeks  PLANNED INTERVENTIONS: Therapeutic exercises, Therapeutic activity, Neuromuscular re-education, Patient/Family education, Self Care, Joint mobilization, Aquatic Therapy, Dry Needling, Spinal mobilization, Cryotherapy, Moist heat, Taping, and Manual therapy  PLAN FOR NEXT SESSION:  TPDN, joint mobs, C/S ROM  Daleen Bo PT, DPT 08/21/22 3:49 PM   Referring diagnosis? M47.812 (ICD-10-CM) - Spondylosis without myelopathy or radiculopathy, cervical region Treatment diagnosis? (if different than referring diagnosis)  What was this (referring dx) caused by? '[]'$  Surgery '[]'$  Fall '[]'$  Ongoing issue '[x]'$  Arthritis '[]'$  Other: ____________   Laterality: '[]'$  Rt '[]'$  Lt '[x]'$  Both   Check all possible CPT codes:             *CHOOSE 10 OR LESS*                          '[x]'$  97110 (Therapeutic Exercise)             '[]'$  92507 (SLP Treatment)  '[x]'$  97112 (Neuro Re-ed)                           '[]'$  92526 (Swallowing Treatment)             '[]'$  97116 (Gait Training)                           '[]'$  [id number] (Cognitive Training, 1st 15 minutes) '[x]'$  97140 (Manual Therapy)                                '[]'$  97130 (Cognitive Training, each add'l 15 minutes)   '[]'$  97164 (Re-evaluation)                              '[]'$  Other, List CPT Code ____________  '[x]'$  93235 (Therapeutic Activities)                                    [  x] [phone] (Self Care)                      '[]'$  All codes above (97110 - 97535)            '[]'$  97012 (Mechanical Traction)            '[x]'$  97014 (E-stim Unattended)            '[]'$  97032 (E-stim manual)            '[]'$  97033 (Ionto)            '[x]'$  97035 (Ultrasound) '[]'$  97750 (Physical Performance Training) '[]'$  [id number] (Aquatic Therapy) '[]'$  97016 (Vasopneumatic Device) '[]'$  [id number] (Paraffin) '[]'$  97034 (Contrast Bath) '[]'$  97597 (Wound Care 1st 20 sq cm) '[]'$   97598 (Wound Care each add'l 20 sq cm) '[]'$  97760 (Orthotic Fabrication, Fitting, Training Initial) '[]'$  [id number] (Prosthetic Management and Training Initial) '[]'$  [id number] (Orthotic or Prosthetic Training/ Modification Subsequent)

## 2022-08-28 ENCOUNTER — Ambulatory Visit (HOSPITAL_BASED_OUTPATIENT_CLINIC_OR_DEPARTMENT_OTHER): Payer: Medicare PPO | Admitting: Physical Therapy

## 2022-08-28 DIAGNOSIS — M542 Cervicalgia: Secondary | ICD-10-CM

## 2022-08-28 DIAGNOSIS — M62838 Other muscle spasm: Secondary | ICD-10-CM

## 2022-08-28 NOTE — Therapy (Signed)
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   Anemia    Arthritis    rt hip   Colon cancer ([place])    Diabetes mellitus ([place])    type 2 - controlled   Diverticulosis    Elevated cholesterol    Family history of breast cancer    Family history of colon cancer    Family history of pulmonary fibrosis    GERD (gastroesophageal reflux disease)    Heart murmur    Hepatic lesion    Hx of adenomatous colonic polyps 07/02/12   Hypertension    Internal hemorrhoids    Mitral valve prolapse    Ventral hernia    Vitamin D deficiency    Past Surgical History:  Procedure Laterality Date   CATARACT EXTRACTION  07/2017   [place]   Dental Implants   INCISIONAL HERNIA REPAIR N/A 08/29/2016   Procedure: HERNIA REPAIR INCISIONAL;  Surgeon: Erroll Luna, MD;  Location: [facility];  Service: General;  Laterality: N/A;  HERNIA REPAIR INCISIONAL   INSERTION OF MESH N/A 08/29/2016   Procedure: INSERTION OF MESH;  Surgeon: Erroll Luna, MD;  Location: [facility];  Service: General;  Laterality: N/A;  INSERTION OF MESH   MOUTH SURGERY     dental implants   PARTIAL COLECTOMY  07/28/2012   VAGINAL HYSTERECTOMY     Patient Active Problem List   Diagnosis Date Noted   Hx of adenomatous and sessile serrated colonic polyps 12/23/2020   Family history of colon cancer     Family history of breast cancer    Family history of pulmonary fibrosis    Genetic testing 10/01/2016   Family history of breast cancer in female 10/01/2016   Incisional hernia, without obstruction or gangrene 07/09/2013   History of colon cancer 07/09/2013   Colon cancer ([place]) 07/10/2012   Family history of malignant neoplasm of gastrointestinal tract 06/30/2012   Diabetes mellitus ([place]) 06/30/2012    PCP: Sela Hilding MD  REFERRING PROVIDER: Dr Karle Plumber   REFERRING DIAG: [phone] (ICD-10-CM) - Spondylosis without myelopathy or radiculopathy, cervical region  THERAPY DIAG:  Cervicalgia - Plan: PT plan of care cert/re-cert  Other muscle spasm - Plan: PT plan of care cert/re-cert  Rationale for Evaluation and Treatment Rehabilitation  ONSET DATE: year and a half   SUBJECTIVE:  SUBJECTIVE STATEMENT: Pt states the neck is more sore after last time. She states she is unsure if the TPDN to UT caused it last time. It seemed to "linger after last session." Pt brings in TENS unit for guidance on usage.    PERTINENT HISTORY:  DMII, right hip arthritis; DMIIm            Cataracts   PAIN:  Are you having pain? No: NPRS scale: 3.5/10 with movement Pain location: Left side in cervical paraspinals  Pain description: aching  Aggravating factors: movement/ turning her head  Relieving factors: has a c-collar; heating collar;    PRECAUTIONS: None  WEIGHT BEARING RESTRICTIONS No  FALLS:  Has patient fallen in last 6 months? No  LIVING ENVIRONMENT: Lives with: OCCUPATION: retired   PLOF: Independent  PATIENT GOALS to have less pain in her neck   OBJECTIVE:   DIAGNOSTIC FINDINGS:  On her phone: multilevel degeneration and spurring    PALPATION: Spasming in her upper  traps and tightness in her left parapinals    CERVICAL ROM:   Active ROM A/PROM (deg) eval AROM 8/29 9/26   Flexion 25 32 37  Extension 16 35 35  Right lateral flexion Right more restricted  20 21  Left lateral flexion  20 15  Right rotation 43 40 42  Left rotation 40 42 45   (Blank rows = not tested)    TODAY'S TREATMENT:    9/26 MANUAL: manual traction 5 min  Supine UPA and side glides grade III and grade IV respectively- C3-6  STM bilat UT L>R and L sided paraspinals and scalenes  Supine chin nod with manual traction 10x   9/12  Trigger Point Dry-Needling  Treatment instructions: Expect mild to moderate muscle soreness. S/S of pneumothorax if dry needled over a lung field, and to seek immediate medical attention should they occur. Patient verbalized understanding of these instructions and education.   Patient Consent Given: Yes Education (verbally)provided: Yes, previously Muscles treated: bilat UT Electrical stimulation performed: No Treatment response/outcome: improvement in soft tissue tonicity,large LTR on L- very reactive    MANUAL: manual traction 5 min  Supine UPA and side glides grade III and grade IV respectively- C3-6  Prone UPA grade IV bilat C3-6  Supine chin nod with manual traction 10x   9/7  MANUAL: manual traction 5 min Supine UPA and side glides grade III and grade IV respectively- C3-6 Prone UPA grade IV bilat C3-6  C/S snag 2x10 each way  Treatment                            07/31/22: Trigger Point Dry Needling, Manual Therapy Treatment:  Initial or subsequent education regarding Trigger Point Dry Needling: Subsequent Did patient give consent to treatment with Trigger Point Dry Needling: Yes TPDN with skilled palpation and monitoring followed by STM to the following muscles: bil upper traps MANUAL: prone thoracic & rib PA Ktape bil upper trap inhibition  Seated cervical rotation with ktape on- being aware to avoid shoulder  elevation     PATIENT EDUCATION:  Education details: anatomy, postural changes, exercise progression, DOMS expectations, frequency of movement,  envelope of function, HEP, POC  Person educated: Patient Education method: Explanation, Demonstration, Tactile cues, Verbal cues, and Handouts Education comprehension: verbalized understanding, returned demonstration, verbal cues required, tactile cues required, and needs further education   HOME EXERCISE PROGRAM: Access Code: [facility] URL: [email or web address]  ASSESSMENT:  CLINICAL IMPRESSION:  Pt with improvement in ROM from previous session but minimally at this time. Given pt's recent increase in pain, pt will likely need remaining session for pain management and edu about prolonging need for surgical intervention. Pt was able to improve pain by end of session today and had improvement of 10 deg with rotation again. However, pt is likely nearly potential max with therapy. Pt to schedule at a decreased frequency in order to conserve visits and promote more independent management of cervical pain. Pt would benefit from continued skilled therapy in order to reach goals and maximize functional postural strength and ROM for prevention of further functional decline.   OBJECTIVE IMPAIRMENTS decreased ROM, decreased strength, increased fascial restrictions, increased muscle spasms, and pain.   ACTIVITY LIMITATIONS lifting, sleeping, and reach over head  PARTICIPATION LIMITATIONS: cleaning, laundry, shopping, and yard work  PERSONAL FACTORS 1-2 comorbidities: right hip OA   are also affecting patient's functional outcome. Age  REHAB POTENTIAL: Good  CLINICAL DECISION MAKING: Stable/uncomplicated  EVALUATION COMPLEXITY: Low  Daleen Bo PT, DPT 08/28/22 4:46 PM  GOALS: Goals reviewed with patient? Yes  SHORT TERM GOALS: Target date:  8/22  Patient will increase bilateral cervical rotation by 15 degrees  Baseline:   Goal status: ongoing  2.  Patient will increase cervical flexion and extension by 10 degrees  Baseline:  Goal status: achieved  3.  Patient will be independent with basic stretching and strengthening program  Baseline:  Goal status:achieved   LONG TERM GOALS: Target date: 08/14/2022  Patient will report a 75% decrease in stiffness and pain when se wakes up in the morning Baseline:  Goal status: ongoing  2.  Patient will increase bilateral rotation to 60 degrees without pain in order to increase community safety Baseline:  Goal status: ongoing  3.  Patient will be independent with full strengthening program  Baseline:  Goal status: ongoing   PLAN: PT FREQUENCY: 1-2x/week  PT DURATION: 6 weeks  PLANNED INTERVENTIONS: Therapeutic exercises, Therapeutic activity, Neuromuscular re-education, Patient/Family education, Self Care, Joint mobilization, Aquatic Therapy, Dry Needling, Spinal mobilization, Cryotherapy, Moist heat, Taping, and Manual therapy  PLAN FOR NEXT SESSION:  TPDN, joint mobs, C/S ROM  Daleen Bo PT, DPT 08/28/22 4:46 PM   Referring diagnosis? M47.812 (ICD-10-CM) - Spondylosis without myelopathy or radiculopathy, cervical region Treatment diagnosis? (if different than referring diagnosis)  What was this (referring dx) caused by? '[]'$  Surgery '[]'$  Fall '[]'$  Ongoing issue '[x]'$  Arthritis '[]'$  Other: ____________   Laterality: '[]'$  Rt '[]'$  Lt '[x]'$  Both   Check all possible CPT codes:             *CHOOSE 10 OR LESS*                          '[x]'$  97110 (Therapeutic Exercise)             '[]'$  92507 (SLP Treatment)  '[x]'$  97112 (Neuro Re-ed)                           '[]'$  92526 (Swallowing Treatment)             '[]'$  97116 (Gait Training)                           '[]'$  [id number] (Cognitive Training, 1st 15 minutes) '[x]'$  97140 (Manual Therapy)                                '[]'$   69450 (Cognitive Training, each add'l 15 minutes)   '[]'$  [id number] (Re-evaluation)                              '[]'$   Other, List CPT Code ____________  '[x]'$  38882 (Therapeutic Activities)                                    '[x]'$  97535 (Self Care)                      '[]'$  All codes above (97110 - 97535)            '[]'$  97012 (Mechanical Traction)            '[x]'$  97014 (E-stim Unattended)            '[]'$  97032 (E-stim manual)            '[]'$  97033 (Ionto)            '[x]'$  97035 (Ultrasound) '[]'$  97750 (Physical Performance Training) '[]'$  80034 (Aquatic Therapy) '[]'$  97016 (Vasopneumatic Device) '[]'$  [id number] (Paraffin) '[]'$  97034 (Contrast Bath) '[]'$  [phone] (Wound Care 1st 20 sq cm) '[]'$  97598 (Wound Care each add'l 20 sq cm) '[]'$  97760 (Orthotic Fabrication, Fitting, Training Initial) '[]'$  [id number] (Prosthetic Management and Training Initial) '[]'$  [id number] (Orthotic or Prosthetic Training/ Modification Subsequent)

## 2022-09-04 ENCOUNTER — Encounter (HOSPITAL_BASED_OUTPATIENT_CLINIC_OR_DEPARTMENT_OTHER): Payer: Medicare PPO | Admitting: Physical Therapy

## 2022-09-11 ENCOUNTER — Encounter (HOSPITAL_BASED_OUTPATIENT_CLINIC_OR_DEPARTMENT_OTHER): Payer: Self-pay | Admitting: Physical Therapy

## 2022-09-11 ENCOUNTER — Ambulatory Visit (HOSPITAL_BASED_OUTPATIENT_CLINIC_OR_DEPARTMENT_OTHER): Payer: Medicare PPO | Attending: Neurological Surgery | Admitting: Physical Therapy

## 2022-09-11 DIAGNOSIS — M62838 Other muscle spasm: Secondary | ICD-10-CM | POA: Diagnosis not present

## 2022-09-11 DIAGNOSIS — M542 Cervicalgia: Secondary | ICD-10-CM | POA: Insufficient documentation

## 2022-09-11 NOTE — Therapy (Signed)
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   Anemia    Arthritis    rt hip   Colon cancer ([place])    Diabetes mellitus ([place])    type 2 - controlled   Diverticulosis    Elevated cholesterol    Family history of breast cancer    Family history of colon cancer    Family history of pulmonary fibrosis    GERD (gastroesophageal reflux disease)    Heart murmur    Hepatic lesion    Hx of adenomatous colonic polyps 07/02/12   Hypertension    Internal hemorrhoids    Mitral valve prolapse    Ventral hernia    Vitamin D deficiency    Past Surgical History:  Procedure Laterality Date   CATARACT EXTRACTION  07/2017   [place]   Dental Implants   INCISIONAL HERNIA REPAIR N/A 08/29/2016   Procedure: HERNIA REPAIR INCISIONAL;  Surgeon: Erroll Luna, MD;  Location: [facility];  Service: General;  Laterality: N/A;  HERNIA REPAIR INCISIONAL   INSERTION OF MESH N/A 08/29/2016   Procedure: INSERTION OF MESH;  Surgeon: Erroll Luna, MD;  Location: [facility];  Service: General;  Laterality: N/A;  INSERTION OF MESH   MOUTH SURGERY     dental implants   PARTIAL COLECTOMY  07/28/2012   VAGINAL HYSTERECTOMY     Patient Active Problem List   Diagnosis Date Noted   Hx of adenomatous and sessile serrated colonic polyps 12/23/2020   Family history of colon  cancer    Family history of breast cancer    Family history of pulmonary fibrosis    Genetic testing 10/01/2016   Family history of breast cancer in female 10/01/2016   Incisional hernia, without obstruction or gangrene 07/09/2013   History of colon cancer 07/09/2013   Colon cancer ([place]) 07/10/2012   Family history of malignant neoplasm of gastrointestinal tract 06/30/2012   Diabetes mellitus ([place]) 06/30/2012    PCP: Sela Hilding MD  REFERRING PROVIDER: Dr Karle Plumber   REFERRING DIAG: [phone] (ICD-10-CM) - Spondylosis without myelopathy or radiculopathy, cervical region  THERAPY DIAG:  Cervicalgia  Other muscle spasm  Rationale for Evaluation and Treatment Rehabilitation  ONSET DATE: year and a half   SUBJECTIVE:  SUBJECTIVE STATEMENT: Pt states the there has been no increase in pain and no UT since last visit. Pt has not noticed stiffness increase. Her spot pain is there but she is able turn further beofre pain starts.    PERTINENT HISTORY:  DMII, right hip arthritis; DMIIm            Cataracts   PAIN:  Are you having pain? No: NPRS scale: 3/10 with movement Pain location: Left side in cervical paraspinals  Pain description: aching  Aggravating factors: movement/ turning her head  Relieving factors: has a c-collar; heating collar;    PRECAUTIONS: None  WEIGHT BEARING RESTRICTIONS No  FALLS:  Has patient fallen in last 6 months? No  LIVING ENVIRONMENT: Lives with: OCCUPATION: retired   PLOF: Independent  PATIENT GOALS to have less pain in her neck   OBJECTIVE:   DIAGNOSTIC FINDINGS:  On her phone: multilevel degeneration and spurring    PALPATION: Spasming in her upper traps and tightness in her left parapinals    CERVICAL ROM:   Active ROM  A/PROM (deg) eval AROM 8/29 9/26   Flexion 25 32 37  Extension 16 35 35  Right lateral flexion Right more restricted  20 21  Left lateral flexion  20 15  Right rotation 43 40 42  Left rotation 40 42 45   (Blank rows = not tested)   FOTO 66 10/10 61 FOTO expected at DC  TODAY'S TREATMENT:   10/10  MANUAL: manual traction 5 min  Supine UPA and side glides grade III and grade IV respectively- C3-6 (improved in SB today)   Exercises - seated chin tuck with rotation - Shoulder External Rotation and Scapular Retraction with RTB  -2 sets - 10 reps - Seated Thoracic Lumbar Extension  - 15 reps - 5 hold  9/26 MANUAL: manual traction 5 min  Supine UPA and side glides grade III and grade IV respectively- C3-6  STM bilat UT L>R and L sided paraspinals and scalenes  Supine chin nod with manual traction 10x   9/12  Trigger Point Dry-Needling  Treatment instructions: Expect mild to moderate muscle soreness. S/S of pneumothorax if dry needled over a lung field, and to seek immediate medical attention should they occur. Patient verbalized understanding of these instructions and education.   Patient Consent Given: Yes Education (verbally)provided: Yes, previously Muscles treated: bilat UT Electrical stimulation performed: No Treatment response/outcome: improvement in soft tissue tonicity,large LTR on L- very reactive    MANUAL: manual traction 5 min  Supine UPA and side glides grade III and grade IV respectively- C3-6  Prone UPA grade IV bilat C3-6  Supine chin nod with manual traction 10x      PATIENT EDUCATION:  Education details: anatomy, postural changes, exercise progression, DOMS expectations, frequency of movement,  envelope of function, HEP, POC  Person educated: Patient Education method: Explanation, Demonstration, Tactile cues, Verbal cues, and Handouts Education comprehension: verbalized understanding, returned demonstration, verbal cues required, tactile  cues required, and needs further education   HOME EXERCISE PROGRAM: Access Code: [facility] URL: [email or web address]  ASSESSMENT:  CLINICAL IMPRESSION:   Pt able to gain bilat lateral flexion ROM today following joint mobilizations and cervical exercise. Pt HEP updated today to include thoracic mobility and postural strengthening today without exacerbation of neck pain. Pt with good tolerance to HEP. Plan to reassess for continued needed for therapy at next. Pt likely able to D/C with independent management. Pt has exceeded expected FOTO score at today's session already. Pt  with good understanding of importance of postural changes as well as compliance with HEP in order to maintain current ROM. Pt would benefit from continued skilled therapy in order to reach goals and maximize functional postural strength and ROM for prevention of further functional decline.   OBJECTIVE IMPAIRMENTS decreased ROM, decreased strength, increased fascial restrictions, increased muscle spasms, and pain.   ACTIVITY LIMITATIONS lifting, sleeping, and reach over head  PARTICIPATION LIMITATIONS: cleaning, laundry, shopping, and yard work  PERSONAL FACTORS 1-2 comorbidities: right hip OA   are also affecting patient's functional outcome. Age  REHAB POTENTIAL: Good  CLINICAL DECISION MAKING: Stable/uncomplicated  EVALUATION COMPLEXITY: Low  Daleen Bo PT, DPT 09/11/22 10:56 AM  GOALS: Goals reviewed with patient? Yes  SHORT TERM GOALS: Target date:  8/22  Patient will increase bilateral cervical rotation by 15 degrees  Baseline:  Goal status: met  2.  Patient will increase cervical flexion and extension by 10 degrees  Baseline:  Goal status: met  3.  Patient will be independent with basic stretching and strengthening program  Baseline:  Goal status:achieved   LONG TERM GOALS: Target date: 08/14/2022  Patient will report a 75% decrease in stiffness and pain when se wakes up in the  morning Baseline:  Goal status: ongoing  2.  Patient will increase bilateral rotation to 60 degrees without pain in order to increase community safety Baseline:  Goal status: met  3.  Patient will be independent with full strengthening program  Baseline:  Goal status: ongoing   PLAN: PT FREQUENCY: 1-2x/week  PT DURATION: 6 weeks  PLANNED INTERVENTIONS: Therapeutic exercises, Therapeutic activity, Neuromuscular re-education, Patient/Family education, Self Care, Joint mobilization, Aquatic Therapy, Dry Needling, Spinal mobilization, Cryotherapy, Moist heat, Taping, and Manual therapy  PLAN FOR NEXT SESSION:  TPDN, joint mobs, C/S ROM, postural strength  Daleen Bo PT, DPT 09/11/22 10:56 AM   Referring diagnosis? M47.812 (ICD-10-CM) - Spondylosis without myelopathy or radiculopathy, cervical region Treatment diagnosis? (if different than referring diagnosis)  What was this (referring dx) caused by? []  Surgery []  Fall []  Ongoing issue [x]  Arthritis []  Other: ____________   Laterality: []  Rt []  Lt [x]  Both   Check all possible CPT codes:             *CHOOSE 10 OR LESS*                          [x]  97110 (Therapeutic Exercise)             []  92507 (SLP Treatment)  [x]  97112 (Neuro Re-ed)                           []  92526 (Swallowing Treatment)             []  97116 (Gait Training)                           []  [id number] (Cognitive Training, 1st 15 minutes) [x]  97140 (Manual Therapy)                                []  97130 (Cognitive Training, each add'l 15 minutes)   []  97164 (Re-evaluation)                              []  Other,  List CPT Code ____________  [x]  37357 (Therapeutic Activities)                                    [x]  89784 (Self Care)                      []  All codes above (97110 - 97535)            []  97012 (Mechanical Traction)            [x]  97014 (E-stim Unattended)            []  97032 (E-stim manual)            []  97033 (Ionto)            [x]  97035  (Ultrasound) []  97750 (Physical Performance Training) []  [id number] (Aquatic Therapy) []  97016 (Vasopneumatic Device) []  [id number] (Paraffin) []  97034 (Contrast Bath) []  97597 (Wound Care 1st 20 sq cm) []  97598 (Wound Care each add'l 20 sq cm) []  97760 (Orthotic Fabrication, Fitting, Training Initial) []  [id number] (Prosthetic Management and Training Initial) []  [id number] (Orthotic or Prosthetic Training/ Modification Subsequent)

## 2022-09-18 ENCOUNTER — Encounter (HOSPITAL_BASED_OUTPATIENT_CLINIC_OR_DEPARTMENT_OTHER): Payer: Medicare PPO | Admitting: Physical Therapy

## 2022-09-25 ENCOUNTER — Encounter (HOSPITAL_BASED_OUTPATIENT_CLINIC_OR_DEPARTMENT_OTHER): Payer: Self-pay | Admitting: Physical Therapy

## 2022-09-25 ENCOUNTER — Ambulatory Visit (HOSPITAL_BASED_OUTPATIENT_CLINIC_OR_DEPARTMENT_OTHER): Payer: Medicare PPO | Admitting: Physical Therapy

## 2022-09-25 DIAGNOSIS — M62838 Other muscle spasm: Secondary | ICD-10-CM | POA: Diagnosis not present

## 2022-09-25 DIAGNOSIS — M542 Cervicalgia: Secondary | ICD-10-CM

## 2022-09-25 NOTE — Therapy (Signed)
OUTPATIENT PHYSICAL THERAPY CERVICAL TREATMENT  PHYSICAL THERAPY DISCHARGE SUMMARY  Visits from Start of Care: 10  Plan: Patient agrees to discharge.  Patient goals were mostly met. Patient is being discharged due to meeting the stated rehab goals.         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   Anemia    Arthritis    rt hip   Colon cancer ([place])    Diabetes mellitus ([place])    type 2 - controlled   Diverticulosis    Elevated cholesterol    Family history of breast cancer    Family history of colon cancer    Family history of pulmonary fibrosis    GERD (gastroesophageal reflux disease)    Heart murmur    Hepatic lesion    Hx of adenomatous colonic polyps 07/02/12   Hypertension    Internal hemorrhoids    Mitral valve prolapse    Ventral hernia    Vitamin D deficiency    Past Surgical History:  Procedure Laterality Date   CATARACT EXTRACTION  07/2017   [place]   Dental Implants   INCISIONAL HERNIA REPAIR N/A 08/29/2016   Procedure: HERNIA REPAIR INCISIONAL;  Surgeon: Erroll Luna, MD;  Location: [facility];  Service: General;  Laterality: N/A;  HERNIA REPAIR INCISIONAL   INSERTION OF MESH N/A 08/29/2016   Procedure: INSERTION OF MESH;  Surgeon: Erroll Luna, MD;  Location: [facility];  Service: General;  Laterality: N/A;  INSERTION OF MESH   MOUTH SURGERY     dental  implants   PARTIAL COLECTOMY  07/28/2012   VAGINAL HYSTERECTOMY     Patient Active Problem List   Diagnosis Date Noted   Hx of adenomatous and sessile serrated colonic polyps 12/23/2020   Family history of colon cancer    Family history of breast cancer    Family history of pulmonary fibrosis    Genetic testing 10/01/2016   Family history of breast cancer in female 10/01/2016   Incisional hernia, without obstruction or gangrene 07/09/2013   History of colon cancer 07/09/2013   Colon cancer ([place]) 07/10/2012   Family history of malignant neoplasm of gastrointestinal tract 06/30/2012   Diabetes mellitus ([place]) 06/30/2012    PCP: Sela Hilding MD  REFERRING PROVIDER: Dr Karle Plumber   REFERRING DIAG: [phone] (ICD-10-CM) - Spondylosis without myelopathy or radiculopathy, cervical region  THERAPY DIAG:  Cervicalgia  Other muscle spasm  Rationale for Evaluation and Treatment Rehabilitation  ONSET DATE: year and a half   SUBJECTIVE:  SUBJECTIVE STATEMENT: Pt states she has doing well with management and function but has had more  stress which has increased her neck stiffness. Pt reports 20% improvement in overall neck stiffness since starting therapy.   PERTINENT HISTORY:  DMII, right hip arthritis; DMIIm            Cataracts   PAIN:  Are you having pain? No: NPRS scale: 2/10 with movement Pain location: Left side in cervical paraspinals  Pain description: aching  Aggravating factors: movement/ turning her head  Relieving factors: has a c-collar; heating collar;    PRECAUTIONS: None  WEIGHT BEARING RESTRICTIONS No  FALLS:  Has patient fallen in last 6 months? No  LIVING ENVIRONMENT: Lives with: OCCUPATION: retired   PLOF: Independent  PATIENT GOALS to have less  pain in her neck   OBJECTIVE:   DIAGNOSTIC FINDINGS:  On her phone: multilevel degeneration and spurring    PALPATION: Spasming in her upper traps and tightness in her left parapinals    CERVICAL ROM:   Active ROM A/PROM (deg) eval AROM 8/29 9/26  10/24  Flexion 25 32 37 38  Extension 16 35 35 36  Right lateral flexion Right more restricted  20 21 20   Left lateral flexion  20 15 20   Right rotation 43 40 42 45  Left rotation 40 42 45 45   (Blank rows = not tested)   FOTO 66 10/10 61 FOTO expected at DC  TODAY'S TREATMENT:  10/24  Trigger Point Dry-Needling  Treatment instructions: Expect mild to moderate muscle soreness. S/S of pneumothorax if dry needled over a lung field, and to seek immediate medical attention should they occur. Patient verbalized understanding of these instructions and education.   Patient Consent Given: Yes Education (verbally)provided: Yes, previously Muscles treated: bilat UT Electrical stimulation performed: No Treatment response/outcome: improvement in soft tissue tonicity,large LTR on L- very reactive  MANUAL: manual traction 5 min  Supine UPA and side glides grade III and grade IV respectively- C3-6 (improved in SB today)   10/10  MANUAL: manual traction 5 min  Supine UPA and side glides grade III and grade IV respectively- C3-6 (improved in SB today)   Exercises - seated chin tuck with rotation - Shoulder External Rotation and Scapular Retraction with RTB  -2 sets - 10 reps - Seated Thoracic Lumbar Extension  - 15 reps - 5 hold  9/26 MANUAL: manual traction 5 min  Supine UPA and side glides grade III and grade IV respectively- C3-6  STM bilat UT L>R and L sided paraspinals and scalenes  Supine chin nod with manual traction 10x   9/12  Trigger Point Dry-Needling  Treatment instructions: Expect mild to moderate muscle soreness. S/S of pneumothorax if dry needled over a lung field, and to seek immediate medical attention  should they occur. Patient verbalized understanding of these instructions and education.   Patient Consent Given: Yes Education (verbally)provided: Yes, previously Muscles treated: bilat UT Electrical stimulation performed: No Treatment response/outcome: improvement in soft tissue tonicity,large LTR on L- very reactive    MANUAL: manual traction 5 min  Supine UPA and side glides grade III and grade IV respectively- C3-6  Prone UPA grade IV bilat C3-6  Supine chin nod with manual traction 10x      PATIENT EDUCATION:  Education details: anatomy, postural changes, exercise progression, DOMS expectations, frequency of movement,  envelope of function, HEP, POC  Person educated: Patient Education method: Explanation, Demonstration, Tactile cues, Verbal cues, and Handouts Education comprehension:  verbalized understanding, returned demonstration, verbal cues required, tactile cues required, and needs further education   HOME EXERCISE PROGRAM: Access Code: [facility] URL: [email or web address]  ASSESSMENT:  CLINICAL IMPRESSION:   Pt has gained and maintained C/S ROM with therapy visit and has had decreased pain since starting therapy. Pt has reach current max potential with this episode of therapy and has good understanding of self management techniques. Pt has met most goals at this time and is ready for D/C. Pt advised to return should there be a functional status change. Pt gives verbal understanding to tapering of HEP as needed.   OBJECTIVE IMPAIRMENTS decreased ROM, decreased strength, increased fascial restrictions, increased muscle spasms, and pain.   ACTIVITY LIMITATIONS lifting, sleeping, and reach over head  PARTICIPATION LIMITATIONS: cleaning, laundry, shopping, and yard work  PERSONAL FACTORS 1-2 comorbidities: right hip OA   are also affecting patient's functional outcome. Age  REHAB POTENTIAL: Good  CLINICAL DECISION MAKING:  Stable/uncomplicated  EVALUATION COMPLEXITY: Low  Daleen Bo PT, DPT 09/25/22 10:59 AM  GOALS: Goals reviewed with patient? Yes  SHORT TERM GOALS: Target date:  8/22  Patient will increase bilateral cervical rotation by 15 degrees  Baseline:  Goal status: met  2.  Patient will increase cervical flexion and extension by 10 degrees  Baseline:  Goal status: met  3.  Patient will be independent with basic stretching and strengthening program  Baseline:  Goal status:achieved   LONG TERM GOALS: Target date: 08/14/2022  Patient will report a 75% decrease in stiffness and pain when se wakes up in the morning Baseline:  Goal status: partially met  2.  Patient will increase bilateral rotation to 60 degrees without pain in order to increase community safety Baseline:  Goal status: met  3.  Patient will be independent with full strengthening program  Baseline:  Goal status: met   PLAN: PT FREQUENCY: 1-2x/week  PT DURATION: 6 weeks  PLANNED INTERVENTIONS: Therapeutic exercises, Therapeutic activity, Neuromuscular re-education, Patient/Family education, Self Care, Joint mobilization, Aquatic Therapy, Dry Needling, Spinal mobilization, Cryotherapy, Moist heat, Taping, and Manual therapy  Daleen Bo PT, DPT 09/25/22 10:59 AM   Referring diagnosis? M47.812 (ICD-10-CM) - Spondylosis without myelopathy or radiculopathy, cervical region Treatment diagnosis? (if different than referring diagnosis)  What was this (referring dx) caused by? []  Surgery []  Fall []  Ongoing issue [x]  Arthritis []  Other: ____________   Laterality: []  Rt []  Lt [x]  Both   Check all possible CPT codes:             *CHOOSE 10 OR LESS*                          [x]  97110 (Therapeutic Exercise)             []  92507 (SLP Treatment)  [x]  97112 (Neuro Re-ed)                           []  92526 (Swallowing Treatment)             []  97116 (Gait Training)                           []  [id number] (Cognitive Training,  1st 15 minutes) [x]  97140 (Manual Therapy)                                []   21224 (Cognitive Training, each add'l 15 minutes)   []  [id number] (Re-evaluation)                              []  Other, List CPT Code ____________  [x]  82500 (Therapeutic Activities)                                    [x]  37048 (Self Care)                      []  All codes above (97110 - 97535)            []  97012 (Mechanical Traction)            [x]  97014 (E-stim Unattended)            []  97032 (E-stim manual)            []  97033 (Ionto)            [x]  97035 (Ultrasound) []  97750 (Physical Performance Training) []  [id number] (Aquatic Therapy) []  97016 (Vasopneumatic Device) []  [id number] (Paraffin) []  97034 (Contrast Bath) []  97597 (Wound Care 1st 20 sq cm) []  97598 (Wound Care each add'l 20 sq cm) []  97760 (Orthotic Fabrication, Fitting, Training Initial) []  88916 (Prosthetic Management and Training Initial) []  [id number] (Orthotic or Prosthetic Training/ Modification Subsequent)

## 2022-10-23 DIAGNOSIS — D509 Iron deficiency anemia, unspecified: Secondary | ICD-10-CM | POA: Diagnosis not present

## 2022-10-23 DIAGNOSIS — E559 Vitamin D deficiency, unspecified: Secondary | ICD-10-CM | POA: Diagnosis not present

## 2022-10-23 DIAGNOSIS — E7800 Pure hypercholesterolemia, unspecified: Secondary | ICD-10-CM | POA: Diagnosis not present

## 2022-10-23 DIAGNOSIS — Z5181 Encounter for therapeutic drug level monitoring: Secondary | ICD-10-CM | POA: Diagnosis not present

## 2022-10-23 DIAGNOSIS — E119 Type 2 diabetes mellitus without complications: Secondary | ICD-10-CM | POA: Diagnosis not present

## 2022-10-29 DIAGNOSIS — D509 Iron deficiency anemia, unspecified: Secondary | ICD-10-CM | POA: Diagnosis not present

## 2022-10-29 DIAGNOSIS — Z0000 Encounter for general adult medical examination without abnormal findings: Secondary | ICD-10-CM | POA: Diagnosis not present

## 2022-10-29 DIAGNOSIS — Z836 Family history of other diseases of the respiratory system: Secondary | ICD-10-CM | POA: Diagnosis not present

## 2022-10-29 DIAGNOSIS — Z85038 Personal history of other malignant neoplasm of large intestine: Secondary | ICD-10-CM | POA: Diagnosis not present

## 2022-10-29 DIAGNOSIS — I10 Essential (primary) hypertension: Secondary | ICD-10-CM | POA: Diagnosis not present

## 2022-10-29 DIAGNOSIS — E7800 Pure hypercholesterolemia, unspecified: Secondary | ICD-10-CM | POA: Diagnosis not present

## 2022-10-29 DIAGNOSIS — E119 Type 2 diabetes mellitus without complications: Secondary | ICD-10-CM | POA: Diagnosis not present

## 2022-10-29 DIAGNOSIS — M8580 Other specified disorders of bone density and structure, unspecified site: Secondary | ICD-10-CM | POA: Diagnosis not present

## 2022-10-29 DIAGNOSIS — E1169 Type 2 diabetes mellitus with other specified complication: Secondary | ICD-10-CM | POA: Diagnosis not present

## 2022-11-03 DIAGNOSIS — R5383 Other fatigue: Secondary | ICD-10-CM | POA: Diagnosis not present

## 2022-11-03 DIAGNOSIS — R051 Acute cough: Secondary | ICD-10-CM | POA: Diagnosis not present

## 2022-11-03 DIAGNOSIS — R0981 Nasal congestion: Secondary | ICD-10-CM | POA: Diagnosis not present

## 2022-11-03 DIAGNOSIS — J0100 Acute maxillary sinusitis, unspecified: Secondary | ICD-10-CM | POA: Diagnosis not present

## 2022-11-03 DIAGNOSIS — Z03818 Encounter for observation for suspected exposure to other biological agents ruled out: Secondary | ICD-10-CM | POA: Diagnosis not present

## 2022-11-13 DIAGNOSIS — H5211 Myopia, right eye: Secondary | ICD-10-CM | POA: Diagnosis not present

## 2022-11-13 DIAGNOSIS — Z961 Presence of intraocular lens: Secondary | ICD-10-CM | POA: Diagnosis not present

## 2022-11-13 DIAGNOSIS — E119 Type 2 diabetes mellitus without complications: Secondary | ICD-10-CM | POA: Diagnosis not present

## 2022-11-14 DIAGNOSIS — G4719 Other hypersomnia: Secondary | ICD-10-CM | POA: Diagnosis not present

## 2022-11-21 ENCOUNTER — Other Ambulatory Visit: Payer: Self-pay | Admitting: Family Medicine

## 2022-11-21 DIAGNOSIS — Z1231 Encounter for screening mammogram for malignant neoplasm of breast: Secondary | ICD-10-CM

## 2022-12-20 DIAGNOSIS — E1169 Type 2 diabetes mellitus with other specified complication: Secondary | ICD-10-CM | POA: Diagnosis not present

## 2022-12-20 DIAGNOSIS — N898 Other specified noninflammatory disorders of vagina: Secondary | ICD-10-CM | POA: Diagnosis not present

## 2022-12-24 DIAGNOSIS — G4733 Obstructive sleep apnea (adult) (pediatric): Secondary | ICD-10-CM | POA: Diagnosis not present

## 2022-12-25 DIAGNOSIS — G4733 Obstructive sleep apnea (adult) (pediatric): Secondary | ICD-10-CM | POA: Diagnosis not present

## 2023-01-16 ENCOUNTER — Ambulatory Visit: Payer: Medicare PPO

## 2023-01-17 ENCOUNTER — Ambulatory Visit
Admission: RE | Admit: 2023-01-17 | Discharge: 2023-01-17 | Disposition: A | Discharge status: Home / Self Care | Payer: Medicare PPO | Source: Ambulatory Visit | Attending: Family Medicine | Admitting: Family Medicine

## 2023-01-17 DIAGNOSIS — Z1231 Encounter for screening mammogram for malignant neoplasm of breast: Secondary | ICD-10-CM | POA: Diagnosis not present

## 2023-01-21 DIAGNOSIS — D485 Neoplasm of uncertain behavior of skin: Secondary | ICD-10-CM | POA: Diagnosis not present

## 2023-01-21 DIAGNOSIS — L821 Other seborrheic keratosis: Secondary | ICD-10-CM | POA: Diagnosis not present

## 2023-03-18 DIAGNOSIS — G4733 Obstructive sleep apnea (adult) (pediatric): Secondary | ICD-10-CM | POA: Diagnosis not present

## 2023-03-20 DIAGNOSIS — U071 COVID-19: Secondary | ICD-10-CM | POA: Diagnosis not present

## 2023-03-20 DIAGNOSIS — R059 Cough, unspecified: Secondary | ICD-10-CM | POA: Diagnosis not present

## 2023-04-30 DIAGNOSIS — N1831 Chronic kidney disease, stage 3a: Secondary | ICD-10-CM | POA: Diagnosis not present

## 2023-04-30 DIAGNOSIS — E1122 Type 2 diabetes mellitus with diabetic chronic kidney disease: Secondary | ICD-10-CM | POA: Diagnosis not present

## 2023-04-30 DIAGNOSIS — I10 Essential (primary) hypertension: Secondary | ICD-10-CM | POA: Diagnosis not present

## 2023-05-08 DIAGNOSIS — D225 Melanocytic nevi of trunk: Secondary | ICD-10-CM | POA: Diagnosis not present

## 2023-05-08 DIAGNOSIS — C44722 Squamous cell carcinoma of skin of right lower limb, including hip: Secondary | ICD-10-CM | POA: Diagnosis not present

## 2023-05-08 DIAGNOSIS — L821 Other seborrheic keratosis: Secondary | ICD-10-CM | POA: Diagnosis not present

## 2023-05-08 DIAGNOSIS — D485 Neoplasm of uncertain behavior of skin: Secondary | ICD-10-CM | POA: Diagnosis not present

## 2023-05-08 DIAGNOSIS — L814 Other melanin hyperpigmentation: Secondary | ICD-10-CM | POA: Diagnosis not present

## 2023-05-14 ENCOUNTER — Telehealth: Payer: Self-pay | Admitting: Internal Medicine

## 2023-05-14 NOTE — Telephone Encounter (Signed)
I have spoken with patient to advise that Dr Leone Payor indicates previous recall date of 12/2023 is still consistent with expert guidelines, however, if she wants to go forward with early procedure, he is agreeable to doing so in Summer 2024. Patient states that she will defer to Dr Marvell Fuller judgement and will wait for colonoscopy 12/2023.

## 2023-05-14 NOTE — Telephone Encounter (Signed)
Spoke to patient who questions if she should go forward with having a colonoscopy earlier than previously recommended. She states she and her PCP discussed this and PCP is in agreement that procedure should likely be done sooner due to her last colonoscopy pathology and her previous personal history of colon cancer. Patient with + family history colon cancer in sister at age 80 as well as 2 cousins. Patient denies any current GI symptoms at this time. Patient states it is just a "gut feeling" that she needs to have this done.  [name], MD 12/23/2020  4:15 PM EST     Advanced adenoma, 15 mm tubular adenoma with focus of high-grade dysplasia and a tubular adenoma and a sessile serrated polyp   Plan for repeat colonoscopy in 3 years if still healthy given family history of colon cancer personal history of colon cancer and these polyps     Current recommendation for recall is 12/2023  Please advise.Marland KitchenMarland KitchenMarland Kitchen

## 2023-05-14 NOTE — Telephone Encounter (Signed)
PT has a recall for 12/2023. Her pcp feels she needs to have it done earlier than that. She has no symptoms present and is 81. Please advise

## 2023-05-14 NOTE — Telephone Encounter (Signed)
The timing of her recall is consistent with expert guideline recommendations.  However if the patient is anxious about this we can do the procedure this summer.  Go ahead and schedule direct colonoscopy if that is the patient's wish.

## 2023-05-27 DIAGNOSIS — D0471 Carcinoma in situ of skin of right lower limb, including hip: Secondary | ICD-10-CM | POA: Diagnosis not present

## 2023-07-31 DIAGNOSIS — Z789 Other specified health status: Secondary | ICD-10-CM | POA: Diagnosis not present

## 2023-07-31 DIAGNOSIS — L820 Inflamed seborrheic keratosis: Secondary | ICD-10-CM | POA: Diagnosis not present

## 2023-07-31 IMAGING — CR DG CERVICAL SPINE 2 OR 3 VIEWS
4 series · 4 of 4 positions shown · non-contrast
Comparison: None Available.

CLINICAL DATA: Cervicalgia, left worse than right.

EXAM:
CERVICAL SPINE - 2-3 VIEW

[w cervical spine lat]
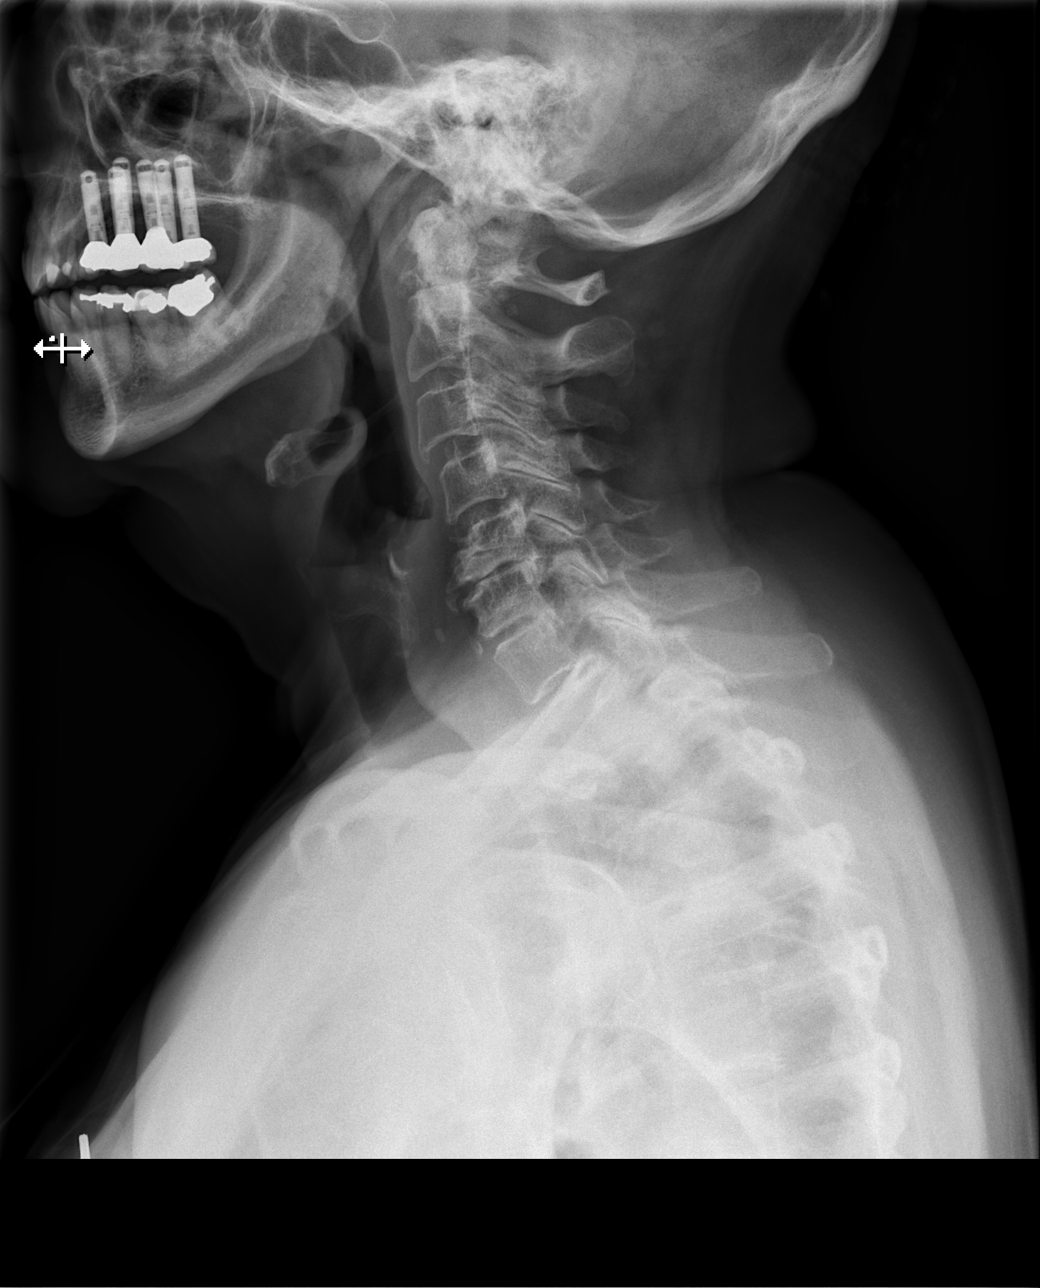

[w cervical swimmers]
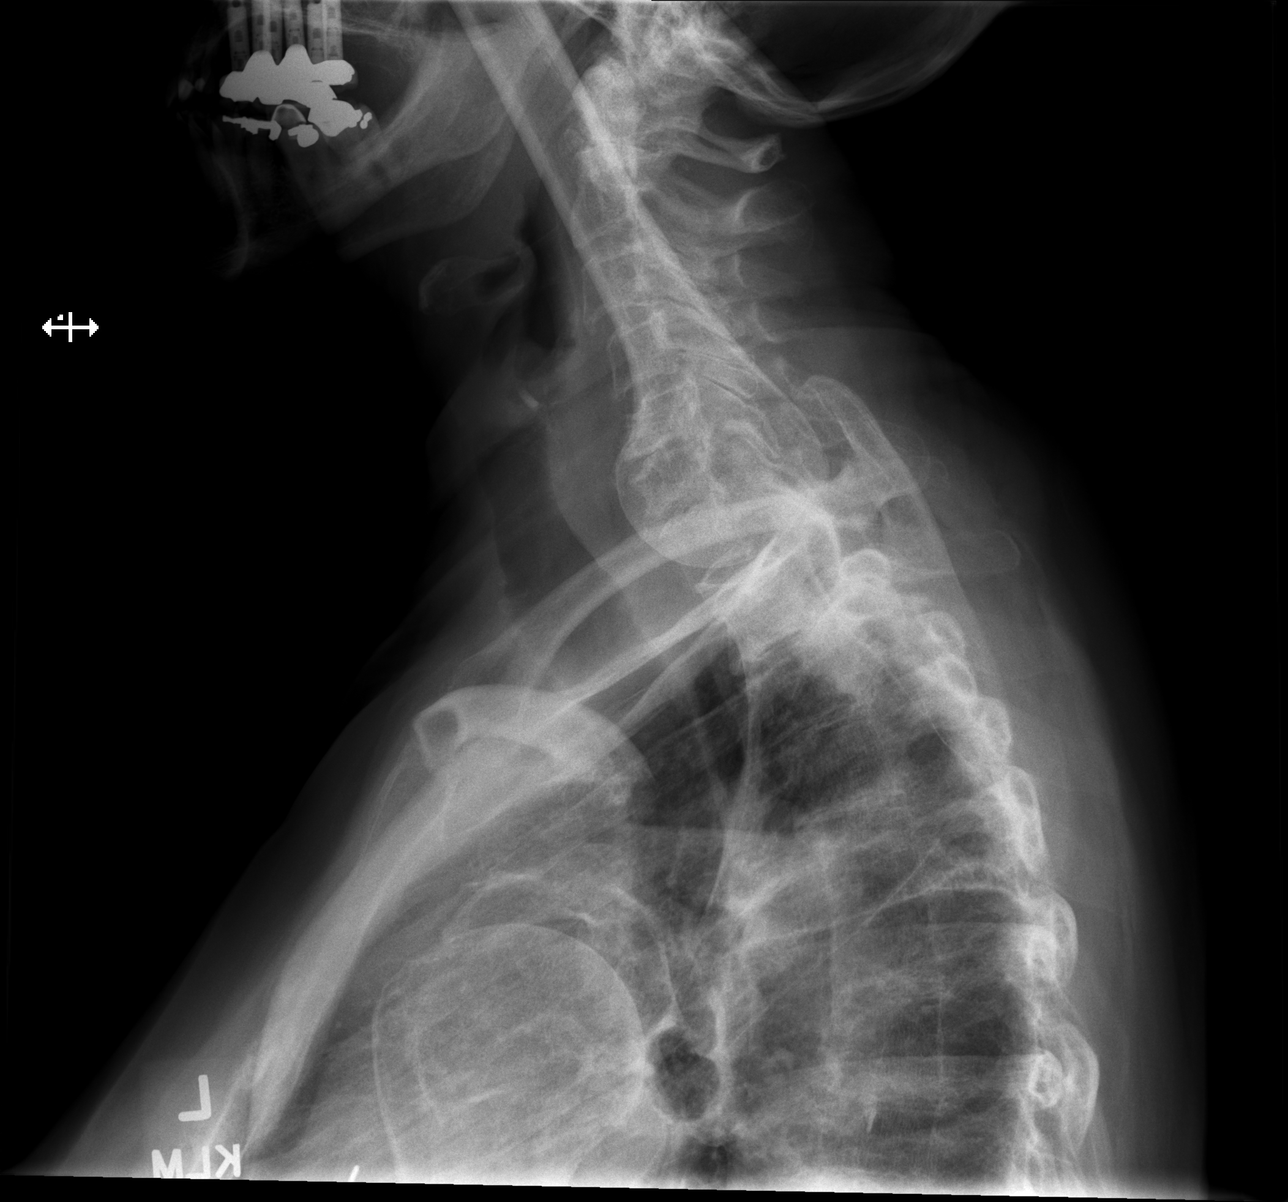

[w cervical spine ap]
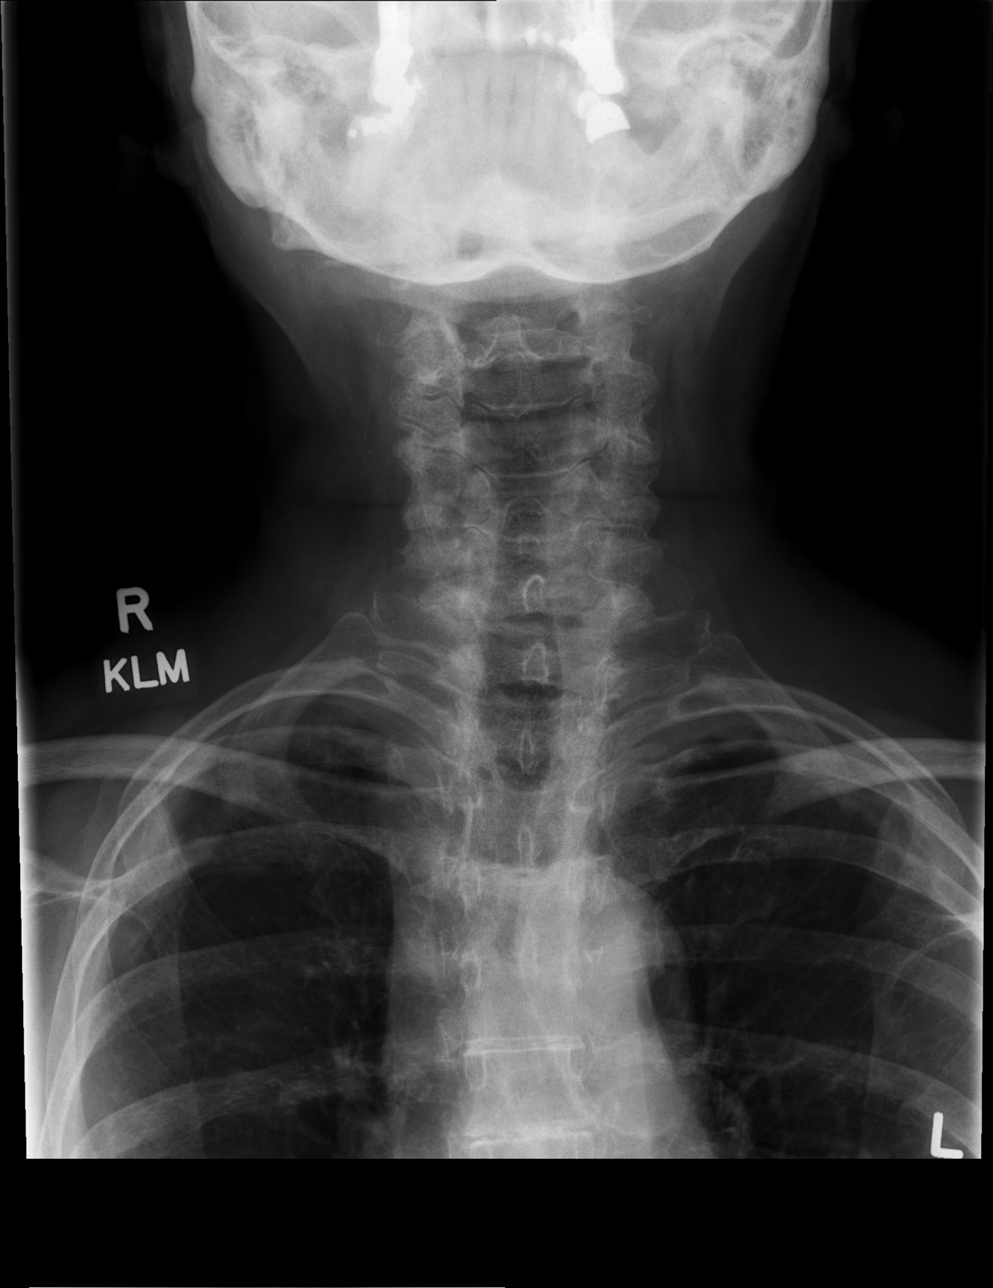

[w cervical spine odontoid]
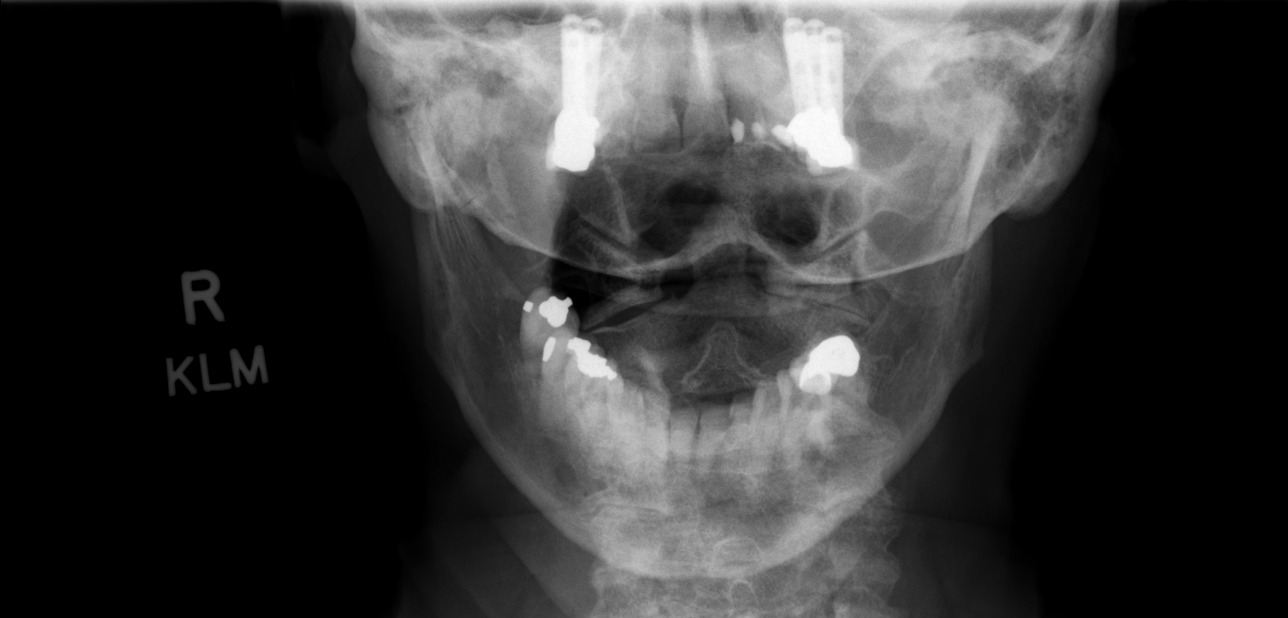

[4 of 4 positions shown; findings below may reference images not displayed]

FINDINGS: There is no evidence of cervical spine fracture or prevertebral soft
tissue swelling. Alignment is normal. Degenerative joint changes
with narrowed joint space and osteophyte formation are noted at
C5-C6 and C7.
IMPRESSION: Moderate to severe osteoarthritic changes from C5 through C7.

## 2023-11-05 DIAGNOSIS — Z0000 Encounter for general adult medical examination without abnormal findings: Secondary | ICD-10-CM | POA: Diagnosis not present

## 2023-11-05 DIAGNOSIS — M8580 Other specified disorders of bone density and structure, unspecified site: Secondary | ICD-10-CM | POA: Diagnosis not present

## 2023-11-05 DIAGNOSIS — Z836 Family history of other diseases of the respiratory system: Secondary | ICD-10-CM | POA: Diagnosis not present

## 2023-11-05 DIAGNOSIS — Z85038 Personal history of other malignant neoplasm of large intestine: Secondary | ICD-10-CM | POA: Diagnosis not present

## 2023-11-05 DIAGNOSIS — E1165 Type 2 diabetes mellitus with hyperglycemia: Secondary | ICD-10-CM | POA: Diagnosis not present

## 2023-11-05 DIAGNOSIS — E7800 Pure hypercholesterolemia, unspecified: Secondary | ICD-10-CM | POA: Diagnosis not present

## 2023-11-05 DIAGNOSIS — D509 Iron deficiency anemia, unspecified: Secondary | ICD-10-CM | POA: Diagnosis not present

## 2023-11-05 DIAGNOSIS — E1122 Type 2 diabetes mellitus with diabetic chronic kidney disease: Secondary | ICD-10-CM | POA: Diagnosis not present

## 2023-11-05 DIAGNOSIS — E559 Vitamin D deficiency, unspecified: Secondary | ICD-10-CM | POA: Diagnosis not present

## 2023-11-05 DIAGNOSIS — I10 Essential (primary) hypertension: Secondary | ICD-10-CM | POA: Diagnosis not present

## 2023-11-06 ENCOUNTER — Other Ambulatory Visit: Payer: Self-pay | Admitting: Family Medicine

## 2023-11-06 DIAGNOSIS — E2839 Other primary ovarian failure: Secondary | ICD-10-CM

## 2023-11-06 DIAGNOSIS — M8580 Other specified disorders of bone density and structure, unspecified site: Secondary | ICD-10-CM

## 2023-11-14 ENCOUNTER — Other Ambulatory Visit: Payer: Self-pay | Admitting: Family Medicine

## 2023-11-14 DIAGNOSIS — Z1231 Encounter for screening mammogram for malignant neoplasm of breast: Secondary | ICD-10-CM

## 2023-11-18 ENCOUNTER — Ambulatory Visit: Payer: Medicare PPO | Admitting: *Deleted

## 2023-11-18 VITALS — Ht 60.0 in | Wt 127.0 lb

## 2023-11-18 DIAGNOSIS — Z85038 Personal history of other malignant neoplasm of large intestine: Secondary | ICD-10-CM

## 2023-11-18 NOTE — Progress Notes (Signed)
Pt's name and DOB verified at the beginning of the pre-visit wit 2 identifiers  Pt denies any difficulty with ambulating,sitting, laying down or rolling side to side  Pt has no issues with ambulation   Pt has no issues moving head neck or swallowing  No egg or soy allergy known to patient   No issues known to pt with past sedation with any surgeries or procedures  Pt denies having issues being intubated  No FH of Malignant Hyperthermia  Pt is not on diet pills or shots  Pt is not on home 02   Pt is not on blood thinners   Pt denies issues with constipation  Pt is not on dialysis  Pt denise any abnormal heart rhythms   Pt denies any upcoming cardiac testing  Pt encouraged to use to use Singlecare or Goodrx to reduce cost   Patient's chart reviewed by Cathlyn Parsons CNRA prior to pre-visit and patient appropriate for the [facility].  Pre-visit completed and red dot placed by patient's name on their procedure day (on provider's schedule).  .  Visit by phone  Pt states weight is 127 lb  Instructed pt why it is important to and  to call if they have any changes in health or new medications. Directed them to the # given and on instructions.     Instructions reviewed. Pt given both [facility] main # and MD on call # prior to instructions.  Pt states understanding. Instructed to review again prior to procedure. Pt states they will.   Instructions sent by mail with coupon and by My Chart  Coupon sent via text to mobile phone and pt verified they received it

## 2023-11-19 DIAGNOSIS — E119 Type 2 diabetes mellitus without complications: Secondary | ICD-10-CM | POA: Diagnosis not present

## 2023-11-19 DIAGNOSIS — Z961 Presence of intraocular lens: Secondary | ICD-10-CM | POA: Diagnosis not present

## 2023-12-02 DIAGNOSIS — Z85828 Personal history of other malignant neoplasm of skin: Secondary | ICD-10-CM | POA: Diagnosis not present

## 2023-12-02 DIAGNOSIS — Z08 Encounter for follow-up examination after completed treatment for malignant neoplasm: Secondary | ICD-10-CM | POA: Diagnosis not present

## 2023-12-02 DIAGNOSIS — Z86007 Personal history of in-situ neoplasm of skin: Secondary | ICD-10-CM | POA: Diagnosis not present

## 2023-12-02 DIAGNOSIS — D225 Melanocytic nevi of trunk: Secondary | ICD-10-CM | POA: Diagnosis not present

## 2023-12-02 DIAGNOSIS — L814 Other melanin hyperpigmentation: Secondary | ICD-10-CM | POA: Diagnosis not present

## 2023-12-02 DIAGNOSIS — L821 Other seborrheic keratosis: Secondary | ICD-10-CM | POA: Diagnosis not present

## 2023-12-24 ENCOUNTER — Encounter: Payer: Self-pay | Admitting: Internal Medicine

## 2023-12-26 NOTE — Progress Notes (Signed)
[facility] History and Physical   Primary Care Physician:  Shon Hale, MD   Reason for Procedure:  History of colon cancer and polyps  Plan:    Colonoscopy     HPI: Linda Grant is a 83 y.o. female with a history of colon cancer (cecum) and colon polyps.  There is also a strong family history of colon cancer.  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   Anemia    Arthritis    rt hip   Cataract    Colon cancer (HCC)    Diabetes mellitus (HCC)    type 2 - controlled   Diverticulosis    Elevated cholesterol    Family history of breast cancer    Family history of colon cancer    Family history of pulmonary fibrosis    GERD (gastroesophageal reflux disease)    Heart murmur    Hepatic lesion    Hx of adenomatous colonic polyps 07/02/2012   Hypertension    Internal hemorrhoids    Mitral valve prolapse    Sleep apnea    Ventral hernia    Vitamin D deficiency     Past Surgical History:  Procedure Laterality Date   CATARACT EXTRACTION  07/2017   COLON SURGERY     COLONOSCOPY     DENTAL SURGERY  1995   Dental Implants   INCISIONAL HERNIA REPAIR N/A 08/29/2016   Procedure: HERNIA REPAIR INCISIONAL;  Surgeon: Harriette Bouillon, MD;  Location: [facility];  Service: General;  Laterality: N/A;  HERNIA REPAIR INCISIONAL   INSERTION OF MESH N/A 08/29/2016   Procedure: INSERTION OF MESH;  Surgeon: Harriette Bouillon, MD;  Location: [facility];  Service: General;  Laterality: N/A;  INSERTION OF MESH   MOUTH SURGERY     dental implants   PARTIAL COLECTOMY  07/28/2012   VAGINAL HYSTERECTOMY      Prior to Admission medications   Medication  Sig Start Date End Date Taking? Authorizing Provider  ACCU-CHEK AVIVA PLUS test strip  12/23/13   [provider]  aspirin 81 MG tablet Take 81 mg by mouth daily.    [provider]  Cholecalciferol (VITAMIN D3) 2000 UNITS TABS Take 2,000 Units by mouth daily.    [provider]  Cinnamon 500 MG capsule as directed Orally    [provider]  Glucosamine-Chondroit-Vit C-Mn (GLUCOSAMINE CHONDR 1500 COMPLX PO) Take 1 tablet by mouth 2 (two) times daily. 1500/1200    [provider]  lisinopril (PRINIVIL,ZESTRIL) 20 MG tablet Take 10 mg by mouth daily.    [provider]  omeprazole (PRILOSEC) 20 MG capsule Take 20 mg by mouth 2 (two) times daily before a meal.     [provider]  simvastatin (ZOCOR) 40 MG tablet Take 40 mg by mouth every evening.    [provider]  SitaGLIPtin-MetFORMIN HCl (JANUMET XR) 50-1000 MG TB24 Take 1 tablet by mouth daily.    [provider]  Turmeric 500 MG TABS as directed Orally    [provider]  Zinc 50 MG CAPS Take by mouth daily.    [provider]    Current Outpatient Medications  Medication Sig Dispense Refill   ACCU-CHEK AVIVA PLUS test strip      aspirin 81 MG tablet Take 81 mg by mouth daily.     Cholecalciferol (VITAMIN D3) 2000 UNITS TABS Take 2,000 Units by mouth daily.     Cinnamon 500 MG capsule as directed Orally     Glucosamine-Chondroit-Vit C-Mn (GLUCOSAMINE CHONDR 1500 COMPLX PO) Take 1 tablet by mouth 2 (two) times daily. 1500/1200     lisinopril (PRINIVIL,ZESTRIL) 20 MG tablet Take 10 mg by mouth daily.     omeprazole (PRILOSEC) 20 MG capsule Take 20 mg by mouth 2 (two) times daily before a meal.      simvastatin (ZOCOR) 40 MG tablet Take 40 mg by mouth every evening.     SitaGLIPtin-MetFORMIN HCl (JANUMET XR) 50-1000 MG TB24 Take 1 tablet by mouth daily.     Turmeric 500 MG TABS as directed Orally     Zinc 50 MG CAPS Take by mouth daily.      Current Facility-Administered Medications  Medication Dose Route Frequency Provider Last Rate Last Admin   0.9 %  sodium chloride infusion  500 mL Intravenous Once [name], MD        Allergies as of 12/27/2023 - Review Complete 12/27/2023  Allergen Reaction Noted   Bee venom Swelling 11/18/2023   Penicillins Anaphylaxis 06/30/2012    Family History  Problem Relation Age of Onset   Lung cancer Sister 66       smoker   Diabetes Brother        deceased   COPD Brother        d. 56   Diabetes Brother 61   Pulmonary fibrosis Brother    Heart disease Father    Pulmonary fibrosis Father        d. 6   Colon cancer Sister 65       w/ mets to pancreas at 40y; negative genetic testing ([facility] myRisk in 2014)   Pulmonary fibrosis Sister        d. 69   Colon polyps Sister        "benign"; unspecified number   Breast cancer Maternal Aunt        older than 50y   Uterine cancer Cousin 57       maternal 1st cousin, once-removed   Breast cancer Cousin        maternal 1st cousin   Prostate cancer Cousin        first cousin (he is a maternal and paternal 1st cousin)   Bladder Cancer Cousin        bladder cancer w/ intestinal involvement; dx 68s-50s   Pulmonary fibrosis Paternal Uncle    Colon polyps Other        niece w/ "several polyps"; dx <50y   Heart attack Maternal Uncle        d. late 50s-early 60s   Heart attack Maternal Grandmother 89   Heart attack Maternal Grandfather    Heart attack Paternal Grandmother        d. 40s-80s   Heart attack Paternal Grandfather        d. late 70s-early 80s   Breast cancer Maternal Aunt        dx late 70s   Diabetes Maternal Aunt    Heart Problems Maternal Aunt    Diabetes Maternal Aunt    Heart Problems Maternal Aunt    Diabetes Maternal Aunt    Heart Problems Maternal Aunt    Heart  attack Maternal Uncle    Heart attack Maternal Uncle    Melanoma Paternal Uncle    Leukemia Cousin        paternal 1st cousin, once-removed dx  83s   Pulmonary fibrosis Cousin        3-4 pat first cousins with IPF   Esophageal cancer Neg Hx    Rectal cancer Neg Hx    Stomach cancer Neg Hx     Social History   Socioeconomic History   Marital status: Widowed    Spouse name: Not on file   Number of children: Not on file   Years of education: Not on file   Highest education level: Not on file  Occupational History   Occupation: retired  Tobacco Use   Smoking status: Former    Current packs/day: 0.00    Types: Cigarettes    Quit date: 07/10/1982    Years since quitting: 41.4   Smokeless tobacco: Never   Tobacco comments:    social smoker - only 3-4 cigs in a day and not everyday  Vaping Use   Vaping status: Never Used  Substance and Sexual Activity   Alcohol use: Yes    Alcohol/week: 2.0 - 3.0 standard drinks of alcohol    Types: 2 - 3 Glasses of wine per week    Comment: glass of wine w/ dinner -- 2-3x per week   Drug use: No   Sexual activity: Not Currently  Other Topics Concern   Not on file  Social History Narrative   Retired Programmer, systems   No biological children   #3 children she raised and husband has #3 children from his previous marriage   Social Drivers of Corporate investment banker Strain: Not on file  Food Insecurity: Not on file  Transportation Needs: Not on file  Physical Activity: Not on file  Stress: Not on file  Social Connections: Not on file  Intimate Partner Violence: Not on file    Review of Systems:  All other review of systems negative except as mentioned in the HPI.  Physical Exam: Vital signs BP (!) 112/59   Pulse 96   Temp 97.6 F (36.4 C)   Ht 5' (1.524 m)   Wt 127 lb (57.6 kg)   SpO2 98%   BMI 24.80 kg/m   General:   Alert,  Well-developed, well-nourished, pleasant and cooperative in NAD Lungs:  Clear throughout to auscultation.   Heart:  Regular rate and rhythm; no murmurs, clicks, rubs,  or gallops. Abdomen:  Soft, nontender and nondistended. Normal bowel sounds.    Neuro/Psych:  Alert and cooperative. Normal mood and affect. A and O x 3   @Laketa Sandoz  Sena Slate, MD, [facility] [phone] (pager) 12/27/2023 12:00 PM@

## 2023-12-27 ENCOUNTER — Ambulatory Visit (AMBULATORY_SURGERY_CENTER): Payer: Medicare PPO | Admitting: Internal Medicine

## 2023-12-27 ENCOUNTER — Encounter: Payer: Self-pay | Admitting: Internal Medicine

## 2023-12-27 VITALS — BP 116/59 | HR 79 | Temp 97.6°F | Resp 15 | Ht 60.0 in | Wt 127.0 lb

## 2023-12-27 DIAGNOSIS — G4730 Sleep apnea, unspecified: Secondary | ICD-10-CM | POA: Diagnosis not present

## 2023-12-27 DIAGNOSIS — Z860101 Personal history of adenomatous and serrated colon polyps: Secondary | ICD-10-CM | POA: Diagnosis not present

## 2023-12-27 DIAGNOSIS — Z1211 Encounter for screening for malignant neoplasm of colon: Secondary | ICD-10-CM | POA: Diagnosis not present

## 2023-12-27 DIAGNOSIS — Z980 Intestinal bypass and anastomosis status: Secondary | ICD-10-CM

## 2023-12-27 DIAGNOSIS — Z800 Family history of malignant neoplasm of digestive organs: Secondary | ICD-10-CM

## 2023-12-27 DIAGNOSIS — Z85038 Personal history of other malignant neoplasm of large intestine: Secondary | ICD-10-CM

## 2023-12-27 DIAGNOSIS — E119 Type 2 diabetes mellitus without complications: Secondary | ICD-10-CM | POA: Diagnosis not present

## 2023-12-27 DIAGNOSIS — K5730 Diverticulosis of large intestine without perforation or abscess without bleeding: Secondary | ICD-10-CM

## 2023-12-27 DIAGNOSIS — I10 Essential (primary) hypertension: Secondary | ICD-10-CM | POA: Diagnosis not present

## 2023-12-27 MED ORDER — SODIUM CHLORIDE 0.9 % IV SOLN: 500.0000 mL | Freq: Once | INTRAVENOUS | Status: DC

## 2023-12-27 NOTE — Progress Notes (Signed)
Drowsy, VSS, resps reg and even. Report to RN

## 2023-12-27 NOTE — Op Note (Signed)
[facility] Patient Name: Linda Grant Procedure Date: 12/27/2023 11:56 AM MRN: [id number] Endoscopist: [name] , MD, [id number] Age: 83 Referring MD:  Date of Birth: October 07, 1941 Gender: Female Account #: [id number] Procedure:                Colonoscopy Indications:              Surveillance: Personal history of adenomatous                            polyps on last colonoscopy 3 years ago, High risk                            colon cancer surveillance: Personal history of                            colon cancer Medicines:                Monitored Anesthesia Care Procedure:                Pre-Anesthesia Assessment:                           - Prior to the procedure, a History and Physical                            was performed, and patient medications and                            allergies were reviewed. The patient's tolerance of                            previous anesthesia was also reviewed. The risks                            and benefits of the procedure and the sedation                            options and risks were discussed with the patient.                            All questions were answered, and informed consent                            was obtained. Prior Anticoagulants: The patient has                            taken no anticoagulant or antiplatelet agents. ASA                            Grade Assessment: II - A patient with mild systemic                            disease. After reviewing the risks and benefits,  the patient was deemed in satisfactory condition to                            undergo the procedure.                           After obtaining informed consent, the colonoscope                            was passed under direct vision. Throughout the                            procedure, the patient's blood pressure, pulse, and                            oxygen saturations were monitored continuously. The                             Olympus Scope SN [id number] was introduced through the                            anus and advanced to the the cecum, identified by                            appendiceal orifice and ileocecal valve. The                            colonoscopy was performed without difficulty. The                            patient tolerated the procedure well. The quality                            of the bowel preparation was good. The ileocecal                            valve, appendiceal orifice, and rectum were                            photographed. The bowel preparation used was                            Miralax via split dose instruction. Scope In: 12:02:26 PM Scope Out: 12:14:25 PM Scope Withdrawal Time: 0 hours 8 minutes 24 seconds  Total Procedure Duration: 0 hours 11 minutes 59 seconds  Findings:                 The perianal and digital rectal examinations were                            normal.                           Many diverticula were found in the sigmoid colon.  There was evidence of a prior end-to-side                            ileo-colonic anastomosis in the transverse colon.                            This was patent and was characterized by healthy                            appearing mucosa.                           The exam was otherwise without abnormality on                            direct and retroflexion views. Complications:            No immediate complications. Estimated Blood Loss:     Estimated blood loss: none. Impression:               - Diverticulosis in the sigmoid colon.                           - Patent end-to-side ileo-colonic anastomosis,                            characterized by healthy appearing mucosa.                           - The examination was otherwise normal on direct                            and retroflexion views.                           - No specimens collected.                            - Personal history of colonic polyps. Last were                            1/22 - 2 adenomas and ssp max 15 mm and HGD in one                            adenoma. Also has FHx CRCA                           - Personal history of malignant neoplasm of the                            colon. Cecum resected 2013 Recommendation:           - Patient has a contact number available for                            emergencies. The signs and symptoms of potential  delayed complications were discussed with the                            patient. Return to normal activities tomorrow.                            Written discharge instructions were provided to the                            patient.                           - Resume previous diet.                           - Continue present medications.                           - No repeat colonoscopy due to age and the absence                            of colonic polyps. [name], MD 12/27/2023 12:24:31 PM This report has been signed electronically.

## 2023-12-27 NOTE — Patient Instructions (Addendum)
No polyps today.  I think its time to stop doing routine colonoscopy and only performing one if you have problems that warrant it.  I appreciate the opportunity to care for you. [name], MD, FACG  YOU HAD AN ENDOSCOPIC PROCEDURE TODAY AT THE [facility]:   Refer to the procedure report that was given to you for any specific questions about what was found during the examination.  If the procedure report does not answer your questions, please call your gastroenterologist to clarify.  If you requested that your care partner not be given the details of your procedure findings, then the procedure report has been included in a sealed envelope for you to review at your convenience later.  YOU SHOULD EXPECT: Some feelings of bloating in the abdomen. Passage of more gas than usual.  Walking can help get rid of the air that was put into your GI tract during the procedure and reduce the bloating. If you had a lower endoscopy (such as a colonoscopy or flexible sigmoidoscopy) you may notice spotting of blood in your stool or on the toilet paper. If you underwent a bowel prep for your procedure, you may not have a normal bowel movement for a few days.  Please Note:  You might notice some irritation and congestion in your nose or some drainage.  This is from the oxygen used during your procedure.  There is no need for concern and it should clear up in a day or so.  SYMPTOMS TO REPORT IMMEDIATELY:  Following lower endoscopy (colonoscopy or flexible sigmoidoscopy):  Excessive amounts of blood in the stool  Significant tenderness or worsening of abdominal pains  Swelling of the abdomen that is new, acute  Fever of 100F or higher  For urgent or emergent issues, a gastroenterologist can be reached at any hour by calling [phone]. Do not use MyChart messaging for urgent concerns.    DIET:  We do recommend a small meal at first, but then you may proceed to your regular diet.  Drink  plenty of fluids but you should avoid alcoholic beverages for 24 hours.  ACTIVITY:  You should plan to take it easy for the rest of today and you should NOT DRIVE or use heavy machinery until tomorrow (because of the sedation medicines used during the test).    FOLLOW UP: Our staff will call the number listed on your records the next business day following your procedure.  We will call around 7:15- 8:00 am to check on you and address any questions or concerns that you may have regarding the information given to you following your procedure. If we do not reach you, we will leave a message.     SIGNATURES/CONFIDENTIALITY: You and/or your care partner have signed paperwork which will be entered into your electronic medical record.  These signatures attest to the fact that that the information above on your After Visit Summary has been reviewed and is understood.  Full responsibility of the confidentiality of this discharge information lies with you and/or your care-partner.

## 2023-12-27 NOTE — Progress Notes (Signed)
Pt's states no medical or surgical changes since previsit or office visit.

## 2023-12-30 ENCOUNTER — Telehealth: Payer: Self-pay | Admitting: *Deleted

## 2023-12-30 NOTE — Telephone Encounter (Signed)
  Follow up Call-     12/27/2023   11:13 AM  Call back number  Post procedure Call Back phone  # [phone]  Permission to leave phone message Yes     Patient questions:  Do you have a fever, pain , or abdominal swelling? No. Pain Score  0 *  Have you tolerated food without any problems? Yes.    Have you been able to return to your normal activities? Yes.    Do you have any questions about your discharge instructions: Diet   No. Medications  No. Follow up visit  No.  Do you have questions or concerns about your Care? No.  Actions: * If pain score is 4 or above: No action needed, pain <4.

## 2024-01-14 DIAGNOSIS — N898 Other specified noninflammatory disorders of vagina: Secondary | ICD-10-CM | POA: Diagnosis not present

## 2024-01-14 DIAGNOSIS — E119 Type 2 diabetes mellitus without complications: Secondary | ICD-10-CM | POA: Diagnosis not present

## 2024-01-20 ENCOUNTER — Ambulatory Visit
Admission: RE | Admit: 2024-01-20 | Discharge: 2024-01-20 | Disposition: A | Discharge status: Home / Self Care | Payer: Medicare PPO | Source: Ambulatory Visit | Attending: Family Medicine | Admitting: Family Medicine

## 2024-01-20 DIAGNOSIS — Z1231 Encounter for screening mammogram for malignant neoplasm of breast: Secondary | ICD-10-CM

## 2024-05-05 DIAGNOSIS — M8580 Other specified disorders of bone density and structure, unspecified site: Secondary | ICD-10-CM | POA: Diagnosis not present

## 2024-05-05 DIAGNOSIS — E119 Type 2 diabetes mellitus without complications: Secondary | ICD-10-CM | POA: Diagnosis not present

## 2024-05-05 DIAGNOSIS — I10 Essential (primary) hypertension: Secondary | ICD-10-CM | POA: Diagnosis not present

## 2024-05-07 DIAGNOSIS — L538 Other specified erythematous conditions: Secondary | ICD-10-CM | POA: Diagnosis not present

## 2024-05-07 DIAGNOSIS — S80861A Insect bite (nonvenomous), right lower leg, initial encounter: Secondary | ICD-10-CM | POA: Diagnosis not present

## 2024-05-07 DIAGNOSIS — S80862A Insect bite (nonvenomous), left lower leg, initial encounter: Secondary | ICD-10-CM | POA: Diagnosis not present

## 2024-05-07 DIAGNOSIS — D225 Melanocytic nevi of trunk: Secondary | ICD-10-CM | POA: Diagnosis not present

## 2024-05-07 DIAGNOSIS — L820 Inflamed seborrheic keratosis: Secondary | ICD-10-CM | POA: Diagnosis not present

## 2024-05-07 DIAGNOSIS — L814 Other melanin hyperpigmentation: Secondary | ICD-10-CM | POA: Diagnosis not present

## 2024-05-07 DIAGNOSIS — L2989 Other pruritus: Secondary | ICD-10-CM | POA: Diagnosis not present

## 2024-05-07 DIAGNOSIS — R208 Other disturbances of skin sensation: Secondary | ICD-10-CM | POA: Diagnosis not present

## 2024-05-07 DIAGNOSIS — L821 Other seborrheic keratosis: Secondary | ICD-10-CM | POA: Diagnosis not present

## 2024-07-07 ENCOUNTER — Other Ambulatory Visit: Payer: Medicare PPO

## 2024-08-26 ENCOUNTER — Ambulatory Visit (HOSPITAL_BASED_OUTPATIENT_CLINIC_OR_DEPARTMENT_OTHER)
Admission: RE | Admit: 2024-08-26 | Discharge: 2024-08-26 | Disposition: A | Discharge status: Home / Self Care | Source: Ambulatory Visit | Attending: Family Medicine | Admitting: Family Medicine

## 2024-08-26 DIAGNOSIS — M8589 Other specified disorders of bone density and structure, multiple sites: Secondary | ICD-10-CM | POA: Diagnosis not present

## 2024-08-26 DIAGNOSIS — E2839 Other primary ovarian failure: Secondary | ICD-10-CM | POA: Insufficient documentation

## 2024-08-26 DIAGNOSIS — Z780 Asymptomatic menopausal state: Secondary | ICD-10-CM | POA: Diagnosis not present

## 2024-08-26 DIAGNOSIS — M8580 Other specified disorders of bone density and structure, unspecified site: Secondary | ICD-10-CM | POA: Diagnosis not present

## 2024-09-19 ENCOUNTER — Emergency Department (HOSPITAL_BASED_OUTPATIENT_CLINIC_OR_DEPARTMENT_OTHER)
Admission: EM | Admit: 2024-09-19 | Discharge: 2024-09-19 | Disposition: A | Discharge status: Home / Self Care | Attending: Emergency Medicine | Admitting: Emergency Medicine

## 2024-09-19 ENCOUNTER — Emergency Department (HOSPITAL_BASED_OUTPATIENT_CLINIC_OR_DEPARTMENT_OTHER)

## 2024-09-19 ENCOUNTER — Other Ambulatory Visit: Payer: Self-pay

## 2024-09-19 ENCOUNTER — Emergency Department (HOSPITAL_BASED_OUTPATIENT_CLINIC_OR_DEPARTMENT_OTHER): Admitting: Radiology

## 2024-09-19 ENCOUNTER — Encounter (HOSPITAL_BASED_OUTPATIENT_CLINIC_OR_DEPARTMENT_OTHER): Payer: Self-pay | Admitting: Emergency Medicine

## 2024-09-19 DIAGNOSIS — S42141A Displaced fracture of glenoid cavity of scapula, right shoulder, initial encounter for closed fracture: Secondary | ICD-10-CM | POA: Diagnosis not present

## 2024-09-19 DIAGNOSIS — S43004A Unspecified dislocation of right shoulder joint, initial encounter: Secondary | ICD-10-CM | POA: Diagnosis not present

## 2024-09-19 DIAGNOSIS — E119 Type 2 diabetes mellitus without complications: Secondary | ICD-10-CM | POA: Insufficient documentation

## 2024-09-19 DIAGNOSIS — W010XXA Fall on same level from slipping, tripping and stumbling without subsequent striking against object, initial encounter: Secondary | ICD-10-CM | POA: Insufficient documentation

## 2024-09-19 DIAGNOSIS — Z7984 Long term (current) use of oral hypoglycemic drugs: Secondary | ICD-10-CM | POA: Diagnosis not present

## 2024-09-19 DIAGNOSIS — S42201A Unspecified fracture of upper end of right humerus, initial encounter for closed fracture: Secondary | ICD-10-CM | POA: Diagnosis not present

## 2024-09-19 DIAGNOSIS — S42151A Displaced fracture of neck of scapula, right shoulder, initial encounter for closed fracture: Secondary | ICD-10-CM | POA: Diagnosis not present

## 2024-09-19 DIAGNOSIS — S4991XA Unspecified injury of right shoulder and upper arm, initial encounter: Secondary | ICD-10-CM | POA: Diagnosis not present

## 2024-09-19 DIAGNOSIS — S42251A Displaced fracture of greater tuberosity of right humerus, initial encounter for closed fracture: Secondary | ICD-10-CM | POA: Diagnosis not present

## 2024-09-19 DIAGNOSIS — S42291A Other displaced fracture of upper end of right humerus, initial encounter for closed fracture: Secondary | ICD-10-CM | POA: Diagnosis not present

## 2024-09-19 DIAGNOSIS — M19011 Primary osteoarthritis, right shoulder: Secondary | ICD-10-CM | POA: Diagnosis not present

## 2024-09-19 DIAGNOSIS — Z7982 Long term (current) use of aspirin: Secondary | ICD-10-CM | POA: Diagnosis not present

## 2024-09-19 LAB — BASIC METABOLIC PANEL WITH GFR
Anion gap: 16 — ABNORMAL HIGH (ref 5–15)
BUN: 25 mg/dL — ABNORMAL HIGH (ref 8–23)
CO2: 23 mmol/L (ref 22–32)
Calcium: 10 mg/dL (ref 8.9–10.3)
Chloride: 101 mmol/L (ref 98–111)
Creatinine, Ser: 1.13 mg/dL — ABNORMAL HIGH (ref 0.44–1.00)
GFR, Estimated: 49 mL/min — ABNORMAL LOW (ref >60)
Glucose, Bld: 101 mg/dL — ABNORMAL HIGH (ref 70–99)
Potassium: 3.9 mmol/L (ref 3.5–5.1)
Sodium: 140 mmol/L (ref 135–145)

## 2024-09-19 LAB — CBC
HCT: 37 % (ref 36.0–46.0)
Hemoglobin: 11.9 g/dL — ABNORMAL LOW (ref 12.0–15.0)
MCH: 27.7 pg (ref 26.0–34.0)
MCHC: 32.2 g/dL (ref 30.0–36.0)
MCV: 86.2 fL (ref 80.0–100.0)
Platelets: 284 K/uL (ref 150–400)
RBC: 4.29 MIL/uL (ref 3.87–5.11)
RDW: 14.6 % (ref 11.5–15.5)
WBC: 8.9 K/uL (ref 4.0–10.5)
nRBC: 0 % (ref 0.0–0.2)

## 2024-09-19 MED ORDER — TRAMADOL HCL 50 MG PO TABS: 50.0000 mg | ORAL_TABLET | Freq: Four times a day (QID) | ORAL | 0 refills | Status: DC | PRN

## 2024-09-19 MED ORDER — PROPOFOL 10 MG/ML IV BOLUS
2.0000 mg/kg | Freq: Once | INTRAVENOUS | Status: DC
  Filled 2024-09-19: qty 20

## 2024-09-19 MED ORDER — SODIUM CHLORIDE 0.9 % IV BOLUS: 500.0000 mL | Freq: Once | INTRAVENOUS | Status: DC

## 2024-09-19 MED ORDER — TRAMADOL HCL 50 MG PO TABS
50.0000 mg | ORAL_TABLET | Freq: Once | ORAL | Status: AC
  Administered 2024-09-19: 50 mg via ORAL
  Filled 2024-09-19: qty 1

## 2024-09-19 MED ORDER — MORPHINE SULFATE (PF) 4 MG/ML IV SOLN: 4.0000 mg | Freq: Once | INTRAVENOUS | Status: DC

## 2024-09-19 MED ORDER — SODIUM CHLORIDE 0.9 % IV BOLUS
500.0000 mL | Freq: Once | INTRAVENOUS | Status: AC
  Administered 2024-09-19: 500 mL via INTRAVENOUS

## 2024-09-19 NOTE — ED Provider Notes (Signed)
 [place] EMERGENCY DEPARTMENT AT [facility] Provider Note   CSN: [id number] Arrival date & time: 09/19/24  1501     Patient presents with: Shoulder Pain   Linda Grant is a 83 y.o. female.   83 year old female presents to the ED with complaints of right shoulder pain following a mechanical fall today while visiting a friend at assisted living.  Patient reports there was only 1 here for her decision and it was her friend's wheelchair.  When she got up from the wheelchair she tripped over the foot rest and landed on her elbow and she instantly had shoulder pain.  Patient denies hitting her head and denies any blood thinners.  Patient reports she did not lose feeling and did not notice any discoloration but she is afraid it may be dislocated.  She has significant pain with lateral motion of the shoulder.  She was able to get up on her own and drive herself to the hospital.  Patient reports diabetes is well-controlled with metformin.    Prior to Admission medications   Medication Sig Start Date End Date Taking? Authorizing Provider  traMADol (ULTRAM) 50 MG tablet Take 1 tablet (50 mg total) by mouth every 6 (six) hours as needed for up to 5 days. 09/19/24 09/24/24 Yes Myriam Fonda RAMAN, PA-C  ACCU-CHEK AVIVA PLUS test strip  12/23/13   [provider]  aspirin 81 MG tablet Take 81 mg by mouth daily.    [provider]  Cholecalciferol (VITAMIN D3) 2000 UNITS TABS Take 2,000 Units by mouth daily.    [provider]  Cinnamon 500 MG capsule as directed Orally    [provider]  Glucosamine-Chondroit-Vit C-Mn (GLUCOSAMINE CHONDR 1500 COMPLX PO) Take 1 tablet by mouth 2 (two) times daily. 1500/1200    [provider]  lisinopril  (PRINIVIL ,ZESTRIL ) 20 MG tablet Take 10 mg by mouth daily.    [provider]  omeprazole (PRILOSEC) 20 MG capsule Take 20 mg by mouth 2 (two) times daily before a meal.     [provider]   simvastatin  (ZOCOR ) 40 MG tablet Take 40 mg by mouth every evening.    [provider]  SitaGLIPtin-MetFORMIN HCl (JANUMET XR) 50-1000 MG TB24 Take 1 tablet by mouth daily.    [provider]  Turmeric 500 MG TABS as directed Orally    [provider]  Zinc  50 MG CAPS Take by mouth daily.    [provider]    Allergies: Bee venom and Penicillins    Review of Systems  Musculoskeletal:  Positive for arthralgias, joint swelling and myalgias. Negative for back pain, gait problem, neck pain and neck stiffness.  All other systems reviewed and are negative.   Updated Vital Signs BP (!) 144/50   Pulse 94   Temp 98.5 F (36.9 C) (Oral)   Resp 16   Ht 5' (1.524 m)   Wt 59.4 kg   SpO2 100%   BMI 25.58 kg/m   Physical Exam Vitals and nursing note reviewed.  Constitutional:      Appearance: Normal appearance.  HENT:     Head: Normocephalic and atraumatic.     Nose: Nose normal.  Eyes:     Extraocular Movements: Extraocular movements intact.     Conjunctiva/sclera: Conjunctivae normal.     Pupils: Pupils are equal, round, and reactive to light.  Cardiovascular:     Rate and Rhythm: Normal rate.  Pulmonary:     Effort: Pulmonary effort is normal.  No respiratory distress.     Breath sounds: Normal breath sounds.  Abdominal:     General: Abdomen is flat.     Tenderness: There is no abdominal tenderness. There is no guarding.  Musculoskeletal:        General: Swelling and tenderness present.     Cervical back: Normal range of motion.     Right lower leg: No edema.     Left lower leg: No edema.  Skin:    General: Skin is warm.     Capillary Refill: Capillary refill takes less than 2 seconds.     Findings: No rash.  Neurological:     General: No focal deficit present.     Mental Status: She is alert.  Psychiatric:        Mood and Affect: Mood normal.        Behavior: Behavior normal.     (all labs ordered are listed, but only abnormal  results are displayed) Labs Reviewed  BASIC METABOLIC PANEL WITH GFR - Abnormal; Notable for the following components:      Result Value   Glucose, Bld 101 (*)    BUN 25 (*)    Creatinine, Ser 1.13 (*)    GFR, Estimated 49 (*)    Anion gap 16 (*)    All other components within normal limits  CBC - Abnormal; Notable for the following components:   Hemoglobin 11.9 (*)    All other components within normal limits    EKG: None  Radiology: CT Shoulder Right Wo Contrast Result Date: 09/19/2024 EXAM: CT RIGHT SHOULDER, WITHOUT IV CONTRAST 09/19/2024 09:57:16 PM TECHNIQUE: Axial images were acquired through the right shoulder without IV contrast. Reformatted images were reviewed. Automated exposure control, iterative reconstruction, and/or weight based adjustment of the mA/kV was utilized to reduce the radiation dose to as low as reasonably achievable. COMPARISON: Right shoulder x-ray 09/19/2024. CLINICAL HISTORY: Shoulder trauma, instability or dislocation suspected, x-ray done. Rule out dislocation due to inconsistency with x-ray. FINDINGS: BONES: Acute displaced fracture of the greater tuberosity of the humerus. Acute displaced inferior glenoid fracture. Moderate acromioclavicular joint degenerative changes. Mild-to-moderate degenerative changes of the humeral head with subchondral cystic changes. JOINTS: No dislocation. SOFT TISSUES: The soft tissues are unremarkable. LUNGS: Right apical pleural/pulmonary scarring. VASCULATURE: Atherosclerotic plaque. IMPRESSION: 1. Acute displaced greater tuberosity fracture of the humerus. 2. Acute displaced inferior glenoid fracture. 3. No dislocation. Electronically signed by: Morgane Naveau MD 09/19/2024 10:14 PM EDT RP Workstation: [id number]   DG Shoulder Right Result Date: 09/19/2024 CLINICAL DATA:  Right shoulder pain, limited range of motion, fell EXAM: RIGHT SHOULDER - 2+ VIEW COMPARISON:  None Available. FINDINGS: Frontal, transscapular, and axillary  views of the right shoulder are obtained. There is evidence of anterior glenohumeral dislocation on the axillary view, with minimally displaced fracture inferior glenoid process compatible with bony Bankart lesion. Moderate hypertrophic changes of the acromioclavicular joint. Right chest is clear. IMPRESSION: 1. Evidence of anterior glenohumeral dislocation on the axillary view, with displaced fracture inferior glenoid process consistent with bony Bankart lesion. 2. Moderate hypertrophic changes of the acromioclavicular joint. Electronically Signed   By: Ozell Daring M.D.   On: 09/19/2024 16:08     Procedures   Medications Ordered in the ED  sodium chloride  0.9 % bolus 500 mL (0 mLs Intravenous Stopped 09/19/24 2224)  traMADol (ULTRAM) tablet 50 mg (50 mg Oral Given 09/19/24 2239)    83 y.o. female presents to the ED with complaints of mechanical fall and  right shoulder pain, this involves an extensive number of treatment options, and is a complaint that carries with it a high risk of complications and morbidity.  The differential diagnosis includes shoulder dislocation, fracture, rotator cuff injury, elbow fracture, wrist injury/fracture, arterial injury nerve injury (Ddx)  On arrival pt is nontoxic, vitals pulses initially elevated on triage. Exam significant for pain to palpation over the right shoulder and patient has significant pain to extension of the shoulder.   I ordered medication tramadol for pain management  Lab Tests:  I Ordered, reviewed, and interpreted labs, which included: CBC, BMP  Imaging Studies ordered:  I ordered imaging studies which included right shoulder x-ray an CT, I independently visualized and interpreted imaging which showed anterior dislocation with Bankart lesion  CT noted there is no evidence of dislocation but evidence of displaced greater tuberosity humerus fracture and displaced inferior glenoid fracture  ED Course:   83 year old female presents to  the ED with complaints of right shoulder pain after mechanical fall.  Patient reports she landed on her forearm and felt pain in her shoulder.  Patient is not on blood thinners and on initial evaluation patient is nontoxic-appearing and in no acute distress.  Patient is sitting comfortably in ED bed with right arm propped across abdomen.  Her shoulder xray shows evidence of anterior dislocation with Bankart lesion. Patient has full sensation distal to the shoulder and no pain to palpation throughout entire elbow wrist or hand.  Pulses are intact there is full range of motion of the elbow without pain.  Full range of motion in the wrist as well without pain.  There is some mild pain to palpation throughout the shoulder and patient can move the shoulder in the frontal plane but when she tries in lateral plane she has significant pain.  Patient is comfortable at rest.  Patient declines pain medication at this moment.  After discussion with attending it was advised to get basic lab work and then reduce the joint.  Patient was advised of plan and agreed to sedation for shoulder reduction.  Attending reassessed patient and it was noted the shoulder did not clinically appear dislocated.  After discussion CT of the shoulder was ordered for further evaluation.  CT noted there is no evidence of dislocation but evidence of displaced greater tuberosity humerus fracture and displaced inferior glenoid fracture.   Dr. Ozell Bruch was consulted with orthopedics.  He advised sling and follow-up outpatient.  Recommended to do very gentle rotation of the shoulder and minimize movement as much as possible.  Patient was advised of findings and recommendation from orthopedics.  Patient was giving tramadol for outpatient pain management and advised to use after Tylenol  and ibuprofen.  Patient agreed to strict return precautions and agreed to Ortho follow-up.   Portions of this note were generated with Scientist, clinical (histocompatibility and immunogenetics).  Dictation errors may occur despite best attempts at proofreading.   Final diagnoses:  Other closed displaced fracture of proximal end of right humerus, initial encounter  Closed fracture of glenoid cavity and neck of right scapula, initial encounter    ED Discharge Orders          Ordered    traMADol (ULTRAM) 50 MG tablet  Every 6 hours PRN        09/19/24 2231               Myriam Fonda RAMAN, [place] 09/20/24 1712    Bari Roxie HERO, DO 09/20/24 2340

## 2024-09-19 NOTE — ED Triage Notes (Signed)
 Pt via POV c/o right shoulder pain and limited ROM after tripping on a wheelchair at a friend's ALF. Pt landed on her right forearm and thinks she may have dislocated the shoulder. Pain rated 10/10 with movement.

## 2024-09-19 NOTE — Discharge Instructions (Addendum)
 There is no dislocation but your humerus and your glenoid is fractured.  Ortho was consulted and it was advised to follow-up with them and stay in a sling until then.  Ensure gentle rotation of the shoulder joint and minimize movement as much as possible.  I prescribed you tramadol as needed for pain please use ibuprofen and Tylenol  prior to using tramadol.  I have attached a orthopedic follow-up.  If symptoms worsen or you lose feeling in your hands or discoloration of your hands please return to ED for further evaluation.

## 2024-09-21 DIAGNOSIS — S42141A Displaced fracture of glenoid cavity of scapula, right shoulder, initial encounter for closed fracture: Secondary | ICD-10-CM | POA: Diagnosis not present

## 2024-09-22 ENCOUNTER — Other Ambulatory Visit: Payer: Self-pay

## 2024-09-22 ENCOUNTER — Encounter (HOSPITAL_BASED_OUTPATIENT_CLINIC_OR_DEPARTMENT_OTHER): Payer: Self-pay | Admitting: Orthopaedic Surgery

## 2024-09-22 ENCOUNTER — Encounter (HOSPITAL_BASED_OUTPATIENT_CLINIC_OR_DEPARTMENT_OTHER)
Admission: RE | Admit: 2024-09-22 | Discharge: 2024-09-22 | Disposition: A | Discharge status: Home / Self Care | Source: Ambulatory Visit | Attending: Orthopaedic Surgery | Admitting: Orthopaedic Surgery

## 2024-09-22 DIAGNOSIS — Z01818 Encounter for other preprocedural examination: Secondary | ICD-10-CM | POA: Diagnosis not present

## 2024-09-22 DIAGNOSIS — I10 Essential (primary) hypertension: Secondary | ICD-10-CM | POA: Diagnosis not present

## 2024-09-22 DIAGNOSIS — E119 Type 2 diabetes mellitus without complications: Secondary | ICD-10-CM | POA: Insufficient documentation

## 2024-09-22 LAB — SURGICAL PCR SCREEN
MRSA, PCR: NEGATIVE
Staphylococcus aureus: NEGATIVE

## 2024-09-22 NOTE — H&P (Signed)
 PREOPERATIVE H&P  Chief Complaint: Closed displaced fracture of glenoid cavity of right scapula, Closed displaced fracture of neck of right scapula  HPI: Linda Grant is a 83 y.o. female who is scheduled for Procedure(s): ARTHROPLASTY, SHOULDER, TOTAL, REVERSE.   She took a fall saturday 09/19/2024. She went to [facility]. She had x-rays and ct done. She is a diabetic. She is using a sling. Taking naprosyn. She apparently dislocated her shoulder, although it self reduced during the movement around the emergency room. She denies pre-existing shoulder problems.  Symptoms are rated as moderate to severe, and have been worsening.  This is significantly impairing activities of daily living.    Please see clinic note for further details on this patient's care.    She has elected for surgical management.   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   Anemia    Arthritis    rt hip   Cataract    Colon cancer (HCC)    Diabetes mellitus (HCC)    type 2 - controlled   Diverticulosis    Elevated cholesterol    Family history of breast cancer    Family history of colon cancer    Family history of pulmonary fibrosis    GERD (gastroesophageal reflux disease)    Heart murmur    Hepatic lesion    Hx of adenomatous colonic polyps 07/02/2012   Hypertension    Internal hemorrhoids    Mitral valve prolapse    Sleep apnea    mild, uses oral appliance   Ventral hernia    Vitamin D deficiency    Past Surgical History:  Procedure Laterality Date   CATARACT EXTRACTION  07/2017   COLON SURGERY     COLONOSCOPY     DENTAL SURGERY  1995   Dental Implants   INCISIONAL HERNIA REPAIR N/A 08/29/2016   Procedure: HERNIA REPAIR INCISIONAL;  Surgeon: Debby Shipper, MD;  Location: [facility];  Service: General;  Laterality: N/A;  HERNIA REPAIR INCISIONAL   INSERTION OF MESH N/A 08/29/2016   Procedure: INSERTION OF MESH;  Surgeon: Debby Shipper, MD;  Location: [facility];  Service: General;  Laterality: N/A;  INSERTION OF MESH   MOUTH SURGERY     dental implants   PARTIAL COLECTOMY  07/28/2012   VAGINAL HYSTERECTOMY     Social History   Socioeconomic History   Marital status: Widowed    Spouse name: Not on file   Number of children: Not on file   Years of education: Not on file   Highest education level: Not on file  Occupational History   Occupation: retired  Tobacco Use   Smoking status: Former    Current packs/day: 0.00    Types: Cigarettes    Quit date: 07/10/1982    Years since quitting: 42.2   Smokeless tobacco: Never  Vaping Use   Vaping status: Never Used  Substance and Sexual Activity   Alcohol use: Yes    Alcohol/week: 2.0 - 3.0 standard drinks of alcohol    Types: 2 - 3 Glasses of wine per week    Comment: glass of wine w/ dinner -- 2-3x per week   Drug use: No   Sexual activity: Not Currently    Birth control/protection: Surgical    Comment: hyst  Other Topics Concern   Not on file  Social History Narrative   Retired Programmer, systems   No biological children   #3  children she raised and husband has #3 children from his previous marriage   Social Drivers of Community education officer: Not on file  Food Insecurity: Not on file  Transportation Needs: Not on file  Physical Activity: Not on file  Stress: Not on file  Social Connections: Not on file   Family History  Problem Relation Age of Onset   Lung cancer Sister 19       smoker   Diabetes Brother        deceased   COPD Brother        d. 7   Diabetes Brother 25   Pulmonary fibrosis Brother    Heart disease Father    Pulmonary fibrosis Father        d. 84   Colon cancer Sister 47       w/ mets to pancreas at 40y; negative genetic testing ([facility] myRisk in 2014)   Pulmonary fibrosis Sister        d. 52   Colon polyps Sister        benign; unspecified number   Breast cancer Maternal Aunt        older than  50y   Uterine cancer Cousin 45       maternal 1st cousin, once-removed   Breast cancer Cousin        maternal 1st cousin   Prostate cancer Cousin        first cousin (he is a maternal and paternal 1st cousin)   Bladder Cancer Cousin        bladder cancer w/ intestinal involvement; dx 49s-50s   Pulmonary fibrosis Paternal Uncle    Colon polyps Other        niece w/ several polyps; dx <50y   Heart attack Maternal Uncle        d. late 50s-early 60s   Heart attack Maternal Grandmother 89   Heart attack Maternal Grandfather    Heart attack Paternal Grandmother        d. 40s-80s   Heart attack Paternal Grandfather        d. late 70s-early 80s   Breast cancer Maternal Aunt        dx late 70s   Diabetes Maternal Aunt    Heart Problems Maternal Aunt    Diabetes Maternal Aunt    Heart Problems Maternal Aunt    Diabetes Maternal Aunt    Heart Problems Maternal Aunt    Heart attack Maternal Uncle    Heart attack Maternal Uncle    Melanoma Paternal Uncle    Leukemia Cousin        paternal 1st cousin, once-removed dx 11s   Pulmonary fibrosis Cousin        3-4 pat first cousins with IPF   Esophageal cancer Neg Hx    Rectal cancer Neg Hx    Stomach cancer Neg Hx    Allergies  Allergen Reactions   Bee Venom Swelling   Penicillins Anaphylaxis   Prior to Admission medications   Medication Sig Start Date End Date Taking? Authorizing Provider  aspirin 81 MG tablet Take 81 mg by mouth daily.   Yes [provider]  Cholecalciferol (VITAMIN D3) 2000 UNITS TABS Take 2,000 Units by mouth daily.   Yes [provider]  Cinnamon 500 MG capsule as directed Orally   Yes [provider]  glipiZIDE (GLUCOTROL XL) 5 MG 24 hr tablet Take 5 mg by mouth daily with breakfast.   Yes [provider]  Glucosamine-Chondroit-Vit C-Mn (GLUCOSAMINE CHONDR 1500 COMPLX PO) Take 1 tablet by mouth 2 (two) times daily. 1500/1200   Yes [provider]  lisinopril   (PRINIVIL ,ZESTRIL ) 20 MG tablet Take 10 mg by mouth daily.   Yes [provider]  omeprazole (PRILOSEC) 20 MG capsule Take 20 mg by mouth 2 (two) times daily before a meal.    Yes [provider]  simvastatin  (ZOCOR ) 40 MG tablet Take 40 mg by mouth every evening.   Yes [provider]  SitaGLIPtin-MetFORMIN HCl (JANUMET XR) 50-1000 MG TB24 Take 1 tablet by mouth daily.   Yes [provider]  traMADol (ULTRAM) 50 MG tablet Take 1 tablet (50 mg total) by mouth every 6 (six) hours as needed for up to 5 days. 09/19/24 09/24/24 Yes Bundy, Joshua S, PA-C  Turmeric 500 MG TABS as directed Orally   Yes [provider]  Zinc  50 MG CAPS Take by mouth daily.   Yes [provider]  ACCU-CHEK AVIVA PLUS test strip  12/23/13   [provider]    ROS: All other systems have been reviewed and were otherwise negative with the exception of those mentioned in the HPI and as above.  Physical Exam: General: Alert, no acute distress Cardiovascular: No pedal edema Respiratory: No cyanosis, no use of accessory musculature GI: No organomegaly, abdomen is soft and non-tender Skin: No lesions in the area of chief complaint Neurologic: Sensation intact distally Psychiatric: Patient is competent for consent with normal mood and affect Lymphatic: No axillary or cervical lymphadenopathy  MUSCULOSKELETAL:  Right arm has active motion 0 to 30 degrees, neurologically intact, all fingers flex extend and abduct. She has a painful arc of motion, with weakness as well.  Imaging: X-rays and CAT scan from the emergency room demonstrate a dislocation, and the CAT scan demonstrates a reduced shoulder, there is a glenoid fracture with some anterior bone loss.  BMI: Estimated body mass index is 25.58 kg/m as calculated from the following:   Height as of this encounter: 5' (1.524 m).   Weight as of this encounter: 59.4 kg.  Lab Results  Component Value Date    ALBUMIN 4.2 08/28/2016   Diabetes:   Patient has a diagnosis of diabetes, No results found for: HGBA1C Smoking Status:       Assessment: Closed displaced fracture of glenoid cavity of right scapula, Closed displaced fracture of neck of right scapula  Plan: Plan for Procedure(s): ARTHROPLASTY, SHOULDER, TOTAL, REVERSE  The risks benefits and alternatives were discussed with the patient including but not limited to the risks of nonoperative treatment, versus surgical intervention including infection, bleeding, nerve injury,  blood clots, cardiopulmonary complications, morbidity, mortality, among others, and they were willing to proceed.   We additionally specifically discussed risks of axillary nerve injury, infection, periprosthetic fracture, continued pain and longevity of implants prior to beginning procedure.    Patient will be closely monitored in PACU for medical stabilization and pain control. If found stable in PACU, patient may be discharged home with outpatient follow-up. If any concerns regarding patient's stabilization patient will be admitted for observation after surgery. The patient is planning to be discharged home with outpatient PT.   The patient acknowledged the explanation, agreed to proceed with the plan and consent was signed.   Operative Plan: right reverse total shoulder arthroplasty for fracture Discharge Medications: standard DVT Prophylaxis: aspirin Physical Therapy: delayed Special Discharge needs: sling. [address]   Aleck LOISE Stalling, PA-C  09/22/2024 4:43 PM

## 2024-09-24 ENCOUNTER — Encounter (HOSPITAL_BASED_OUTPATIENT_CLINIC_OR_DEPARTMENT_OTHER): Admission: RE | Disposition: A | Payer: Self-pay | Attending: Orthopaedic Surgery

## 2024-09-24 ENCOUNTER — Ambulatory Visit (HOSPITAL_BASED_OUTPATIENT_CLINIC_OR_DEPARTMENT_OTHER): Admitting: Certified Registered"

## 2024-09-24 ENCOUNTER — Ambulatory Visit (HOSPITAL_BASED_OUTPATIENT_CLINIC_OR_DEPARTMENT_OTHER)
Admission: RE | Admit: 2024-09-24 | Discharge: 2024-09-24 | Disposition: A | Attending: Orthopaedic Surgery | Admitting: Orthopaedic Surgery

## 2024-09-24 ENCOUNTER — Encounter (HOSPITAL_BASED_OUTPATIENT_CLINIC_OR_DEPARTMENT_OTHER): Payer: Self-pay | Admitting: Orthopaedic Surgery

## 2024-09-24 ENCOUNTER — Other Ambulatory Visit: Payer: Self-pay

## 2024-09-24 ENCOUNTER — Ambulatory Visit (HOSPITAL_COMMUNITY)

## 2024-09-24 DIAGNOSIS — Z833 Family history of diabetes mellitus: Secondary | ICD-10-CM | POA: Diagnosis not present

## 2024-09-24 DIAGNOSIS — E7800 Pure hypercholesterolemia, unspecified: Secondary | ICD-10-CM | POA: Diagnosis not present

## 2024-09-24 DIAGNOSIS — S42141A Displaced fracture of glenoid cavity of scapula, right shoulder, initial encounter for closed fracture: Secondary | ICD-10-CM | POA: Diagnosis not present

## 2024-09-24 DIAGNOSIS — W19XXXA Unspecified fall, initial encounter: Secondary | ICD-10-CM | POA: Diagnosis not present

## 2024-09-24 DIAGNOSIS — Z01818 Encounter for other preprocedural examination: Secondary | ICD-10-CM

## 2024-09-24 DIAGNOSIS — Z79899 Other long term (current) drug therapy: Secondary | ICD-10-CM | POA: Diagnosis not present

## 2024-09-24 DIAGNOSIS — K219 Gastro-esophageal reflux disease without esophagitis: Secondary | ICD-10-CM | POA: Diagnosis not present

## 2024-09-24 DIAGNOSIS — Z7982 Long term (current) use of aspirin: Secondary | ICD-10-CM | POA: Diagnosis not present

## 2024-09-24 DIAGNOSIS — Z471 Aftercare following joint replacement surgery: Secondary | ICD-10-CM | POA: Diagnosis not present

## 2024-09-24 DIAGNOSIS — G8918 Other acute postprocedural pain: Secondary | ICD-10-CM | POA: Diagnosis not present

## 2024-09-24 DIAGNOSIS — Z7984 Long term (current) use of oral hypoglycemic drugs: Secondary | ICD-10-CM | POA: Insufficient documentation

## 2024-09-24 DIAGNOSIS — G4730 Sleep apnea, unspecified: Secondary | ICD-10-CM | POA: Insufficient documentation

## 2024-09-24 DIAGNOSIS — S42151A Displaced fracture of neck of scapula, right shoulder, initial encounter for closed fracture: Secondary | ICD-10-CM | POA: Insufficient documentation

## 2024-09-24 DIAGNOSIS — M1990 Unspecified osteoarthritis, unspecified site: Secondary | ICD-10-CM | POA: Insufficient documentation

## 2024-09-24 DIAGNOSIS — I341 Nonrheumatic mitral (valve) prolapse: Secondary | ICD-10-CM | POA: Insufficient documentation

## 2024-09-24 DIAGNOSIS — Z96611 Presence of right artificial shoulder joint: Secondary | ICD-10-CM | POA: Diagnosis not present

## 2024-09-24 DIAGNOSIS — M8580 Other specified disorders of bone density and structure, unspecified site: Secondary | ICD-10-CM | POA: Insufficient documentation

## 2024-09-24 DIAGNOSIS — E119 Type 2 diabetes mellitus without complications: Secondary | ICD-10-CM | POA: Insufficient documentation

## 2024-09-24 DIAGNOSIS — Z87891 Personal history of nicotine dependence: Secondary | ICD-10-CM

## 2024-09-24 DIAGNOSIS — S46011A Strain of muscle(s) and tendon(s) of the rotator cuff of right shoulder, initial encounter: Secondary | ICD-10-CM | POA: Diagnosis not present

## 2024-09-24 DIAGNOSIS — S43084A Other dislocation of right shoulder joint, initial encounter: Secondary | ICD-10-CM | POA: Insufficient documentation

## 2024-09-24 DIAGNOSIS — I10 Essential (primary) hypertension: Secondary | ICD-10-CM | POA: Diagnosis not present

## 2024-09-24 DIAGNOSIS — S42251A Displaced fracture of greater tuberosity of right humerus, initial encounter for closed fracture: Secondary | ICD-10-CM | POA: Diagnosis not present

## 2024-09-24 DIAGNOSIS — S43004A Unspecified dislocation of right shoulder joint, initial encounter: Secondary | ICD-10-CM | POA: Diagnosis not present

## 2024-09-24 DIAGNOSIS — S42141K Displaced fracture of glenoid cavity of scapula, right shoulder, subsequent encounter for fracture with nonunion: Secondary | ICD-10-CM | POA: Diagnosis not present

## 2024-09-24 DIAGNOSIS — S4291XA Fracture of right shoulder girdle, part unspecified, initial encounter for closed fracture: Secondary | ICD-10-CM | POA: Diagnosis not present

## 2024-09-24 LAB — GLUCOSE, CAPILLARY
Glucose-Capillary: 98 mg/dL (ref 70–99)
Glucose-Capillary: 134 mg/dL — ABNORMAL HIGH (ref 70–99)

## 2024-09-24 SURGERY — ARTHROPLASTY, SHOULDER, TOTAL, REVERSE
Anesthesia: General | Site: Shoulder | Laterality: Right

## 2024-09-24 MED ORDER — BUPIVACAINE LIPOSOME 1.3 % IJ SUSP
INTRAMUSCULAR | Status: AC
  Filled 2024-09-24: qty 10

## 2024-09-24 MED ORDER — DEXAMETHASONE SOD PHOSPHATE PF 10 MG/ML IJ SOLN
INTRAMUSCULAR | Status: DC | PRN
  Administered 2024-09-24: 5 mg via INTRAVENOUS

## 2024-09-24 MED ORDER — ASPIRIN 81 MG PO CHEW: 81.0000 mg | CHEWABLE_TABLET | Freq: Two times a day (BID) | ORAL | 0 refills | Status: AC

## 2024-09-24 MED ORDER — ROCURONIUM BROMIDE 10 MG/ML (PF) SYRINGE
PREFILLED_SYRINGE | INTRAVENOUS | Status: AC
  Filled 2024-09-24: qty 10

## 2024-09-24 MED ORDER — TRANEXAMIC ACID-NACL 1000-0.7 MG/100ML-% IV SOLN
1000.0000 mg | INTRAVENOUS | Status: AC
  Administered 2024-09-24: 1000 mg via INTRAVENOUS

## 2024-09-24 MED ORDER — SUGAMMADEX SODIUM 200 MG/2ML IV SOLN
INTRAVENOUS | Status: DC | PRN
  Administered 2024-09-24: 400 mg via INTRAVENOUS

## 2024-09-24 MED ORDER — BUPIVACAINE HCL (PF) 0.5 % IJ SOLN
INTRAMUSCULAR | Status: DC | PRN
  Administered 2024-09-24: 20 mL via PERINEURAL

## 2024-09-24 MED ORDER — FENTANYL CITRATE (PF) 100 MCG/2ML IJ SOLN
INTRAMUSCULAR | Status: AC
  Filled 2024-09-24: qty 2

## 2024-09-24 MED ORDER — OXYCODONE HCL 5 MG PO TABS: 5.0000 mg | ORAL_TABLET | Freq: Once | ORAL | Status: DC | PRN

## 2024-09-24 MED ORDER — BUPIVACAINE LIPOSOME 1.3 % IJ SUSP
INTRAMUSCULAR | Status: DC | PRN
  Administered 2024-09-24: 10 mL via PERINEURAL

## 2024-09-24 MED ORDER — MIDAZOLAM HCL 2 MG/2ML IJ SOLN
INTRAMUSCULAR | Status: AC
  Filled 2024-09-24: qty 2

## 2024-09-24 MED ORDER — FENTANYL CITRATE (PF) 100 MCG/2ML IJ SOLN
50.0000 ug | Freq: Once | INTRAMUSCULAR | Status: AC
  Administered 2024-09-24: 50 ug via INTRAVENOUS

## 2024-09-24 MED ORDER — PROPOFOL 10 MG/ML IV BOLUS
INTRAVENOUS | Status: AC
  Filled 2024-09-24: qty 20

## 2024-09-24 MED ORDER — VANCOMYCIN HCL 1000 MG IV SOLR
INTRAVENOUS | Status: DC | PRN
  Administered 2024-09-24: 1000 mg via TOPICAL

## 2024-09-24 MED ORDER — OXYCODONE HCL 5 MG/5ML PO SOLN: 5.0000 mg | Freq: Once | ORAL | Status: DC | PRN

## 2024-09-24 MED ORDER — LIDOCAINE 2% (20 MG/ML) 5 ML SYRINGE
INTRAMUSCULAR | Status: AC
Start: 2024-09-24 — End: 2024-09-24
  Filled 2024-09-24: qty 5

## 2024-09-24 MED ORDER — LIDOCAINE 2% (20 MG/ML) 5 ML SYRINGE
INTRAMUSCULAR | Status: DC | PRN
  Administered 2024-09-24: 40 mg via INTRAVENOUS

## 2024-09-24 MED ORDER — VANCOMYCIN HCL IN DEXTROSE 1-5 GM/200ML-% IV SOLN
1000.0000 mg | INTRAVENOUS | Status: AC
  Administered 2024-09-24: 1000 mg via INTRAVENOUS

## 2024-09-24 MED ORDER — ACETAMINOPHEN 500 MG PO TABS: 1000.0000 mg | ORAL_TABLET | Freq: Three times a day (TID) | ORAL | 0 refills | Status: AC

## 2024-09-24 MED ORDER — ONDANSETRON HCL 4 MG PO TABS: 4.0000 mg | ORAL_TABLET | Freq: Three times a day (TID) | ORAL | 0 refills | Status: AC | PRN

## 2024-09-24 MED ORDER — PHENYLEPHRINE HCL-NACL 20-0.9 MG/250ML-% IV SOLN
INTRAVENOUS | Status: DC | PRN
  Administered 2024-09-24: 25 ug/min via INTRAVENOUS

## 2024-09-24 MED ORDER — PROPOFOL 10 MG/ML IV BOLUS
INTRAVENOUS | Status: DC | PRN
  Administered 2024-09-24: 120 mg via INTRAVENOUS

## 2024-09-24 MED ORDER — PHENYLEPHRINE 80 MCG/ML (10ML) SYRINGE FOR IV PUSH (FOR BLOOD PRESSURE SUPPORT)
PREFILLED_SYRINGE | INTRAVENOUS | Status: AC
Start: 2024-09-24 — End: 2024-09-24
  Filled 2024-09-24: qty 10

## 2024-09-24 MED ORDER — ONDANSETRON HCL 4 MG/2ML IJ SOLN
INTRAMUSCULAR | Status: AC
Start: 2024-09-24 — End: 2024-09-24
  Filled 2024-09-24: qty 2

## 2024-09-24 MED ORDER — CELECOXIB 100 MG PO CAPS: 100.0000 mg | ORAL_CAPSULE | Freq: Two times a day (BID) | ORAL | 0 refills | Status: AC

## 2024-09-24 MED ORDER — ACETAMINOPHEN 500 MG PO TABS
1000.0000 mg | ORAL_TABLET | Freq: Once | ORAL | Status: AC
  Administered 2024-09-24: 1000 mg via ORAL

## 2024-09-24 MED ORDER — ONDANSETRON HCL 4 MG/2ML IJ SOLN
INTRAMUSCULAR | Status: DC | PRN
  Administered 2024-09-24: 4 mg via INTRAVENOUS

## 2024-09-24 MED ORDER — SODIUM CHLORIDE 0.9 % IR SOLN
Status: DC | PRN
  Administered 2024-09-24: 1000 mL

## 2024-09-24 MED ORDER — HYDROMORPHONE HCL 1 MG/ML IJ SOLN: 0.2500 mg | INTRAMUSCULAR | Status: DC | PRN

## 2024-09-24 MED ORDER — FENTANYL CITRATE (PF) 100 MCG/2ML IJ SOLN
INTRAMUSCULAR | Status: DC | PRN
  Administered 2024-09-24: 50 ug via INTRAVENOUS

## 2024-09-24 MED ORDER — GABAPENTIN 300 MG PO CAPS: 300.0000 mg | ORAL_CAPSULE | Freq: Once | ORAL | Status: DC

## 2024-09-24 MED ORDER — MIDAZOLAM HCL (PF) 2 MG/2ML IJ SOLN
1.0000 mg | Freq: Once | INTRAMUSCULAR | Status: AC
Start: 2024-09-24 — End: 2024-09-24
  Administered 2024-09-24: 1 mg via INTRAVENOUS

## 2024-09-24 MED ORDER — TRANEXAMIC ACID-NACL 1000-0.7 MG/100ML-% IV SOLN
INTRAVENOUS | Status: AC
  Filled 2024-09-24: qty 100

## 2024-09-24 MED ORDER — VANCOMYCIN HCL IN DEXTROSE 1-5 GM/200ML-% IV SOLN
INTRAVENOUS | Status: AC
  Filled 2024-09-24: qty 200

## 2024-09-24 MED ORDER — ROCURONIUM BROMIDE 100 MG/10ML IV SOLN
INTRAVENOUS | Status: DC | PRN
  Administered 2024-09-24: 50 mg via INTRAVENOUS

## 2024-09-24 MED ORDER — ACETAMINOPHEN 500 MG PO TABS
ORAL_TABLET | ORAL | Status: AC
Start: 2024-09-24 — End: 2024-09-24
  Filled 2024-09-24: qty 2

## 2024-09-24 MED ORDER — LACTATED RINGERS IV SOLN: INTRAVENOUS | Status: DC

## 2024-09-24 MED ORDER — OXYCODONE HCL 5 MG PO TABS: ORAL_TABLET | ORAL | 0 refills | Status: AC

## 2024-09-24 MED ORDER — PHENYLEPHRINE HCL (PRESSORS) 10 MG/ML IV SOLN
INTRAVENOUS | Status: DC | PRN
  Administered 2024-09-24 (×2): 80 ug via INTRAVENOUS

## 2024-09-24 MED ORDER — AMISULPRIDE (ANTIEMETIC) 5 MG/2ML IV SOLN: 10.0000 mg | Freq: Once | INTRAVENOUS | Status: DC | PRN

## 2024-09-24 SURGICAL SUPPLY — 60 items
BASEPLATE GLENOID RSA 3X25 0D (Shoulder) IMPLANT
BIT DRILL 2.5 STRGHT CANN (BIT) IMPLANT
BIT DRILL 3.2 PERIPHERAL SCREW (BIT) IMPLANT
BLADE SAW SGTL 73X25 THK (BLADE) ×1 IMPLANT
BLADE SURG 10 STRL SS (BLADE) IMPLANT
BLADE SURG 15 STRL LF DISP TIS (BLADE) IMPLANT
BRUSH SCRUB EZ PLAIN DRY (MISCELLANEOUS) ×1 IMPLANT
BUR EGG 3PK/BX (BURR) IMPLANT
CHLORAPREP W/TINT 26 (MISCELLANEOUS) ×1 IMPLANT
CLSR STERI-STRIP ANTIMIC 1/2X4 (GAUZE/BANDAGES/DRESSINGS) ×1 IMPLANT
COOLER ICEMAN CLASSIC (MISCELLANEOUS) ×1 IMPLANT
COVER BACK TABLE 60X90IN (DRAPES) ×1 IMPLANT
COVER MAYO STAND STRL (DRAPES) ×1 IMPLANT
CUP HUM SYS INSERT SZ 1/2 36 (Joint) IMPLANT
DRAPE IMP U-DRAPE 54X76 (DRAPES) IMPLANT
DRAPE INCISE IOBAN 66X45 STRL (DRAPES) ×1 IMPLANT
DRAPE POUCH INSTRU U-SHP 10X18 (DRAPES) ×1 IMPLANT
DRAPE U-SHAPE 76X120 STRL (DRAPES) ×2 IMPLANT
DRSG AQUACEL AG ADV 3.5X 6 (GAUZE/BANDAGES/DRESSINGS) ×1 IMPLANT
ELECTRODE BLDE 4.0 EZ CLN MEGD (MISCELLANEOUS) ×1 IMPLANT
ELECTRODE REM PT RTRN 9FT ADLT (ELECTROSURGICAL) ×1 IMPLANT
FACESHIELD WRAPAROUND OR TEAM (MASK) ×2 IMPLANT
GAUZE XEROFORM 1X8 LF (GAUZE/BANDAGES/DRESSINGS) IMPLANT
GLOVE BIO SURGEON STRL SZ 6.5 (GLOVE) ×2 IMPLANT
GLOVE BIOGEL PI IND STRL 6.5 (GLOVE) ×1 IMPLANT
GLOVE BIOGEL PI IND STRL 8 (GLOVE) ×1 IMPLANT
GLOVE ECLIPSE 8.0 STRL XLNG CF (GLOVE) ×2 IMPLANT
GOWN STRL REUS W/ TWL LRG LVL3 (GOWN DISPOSABLE) ×2 IMPLANT
GOWN STRL REUS W/TWL XL LVL3 (GOWN DISPOSABLE) ×1 IMPLANT
GUIDEWIRE GLENOID 2.5X220 (WIRE) IMPLANT
HEAD CANN REV SHLD STD 36 (Shoulder) IMPLANT
KIT STABILIZATION SHOULDER (MISCELLANEOUS) ×1 IMPLANT
KWIRE TROCAR 1.35 (MISCELLANEOUS) IMPLANT
MANIFOLD NEPTUNE II (INSTRUMENTS) ×1 IMPLANT
PACK BASIN DAY SURGERY FS (CUSTOM PROCEDURE TRAY) ×1 IMPLANT
PACK SHOULDER (CUSTOM PROCEDURE TRAY) ×1 IMPLANT
PAD COLD SHLDR WRAP-ON (PAD) ×1 IMPLANT
PIN GUIDE 3X75 SHOULDER (PIN) IMPLANT
RESTRAINT HEAD UNIVERSAL NS (MISCELLANEOUS) ×1 IMPLANT
SCREW 5.0X18 (Screw) IMPLANT
SCREW 5.5X14 (Screw) IMPLANT
SCREW CANN THRD 4X30 (Screw) IMPLANT
SCREW CENTRAL THREAD 6.5X45 (Screw) IMPLANT
SCREW PERIPHERAL 30 (Screw) IMPLANT
SET HNDPC FAN SPRY TIP SCT (DISPOSABLE) ×1 IMPLANT
SHEET MEDIUM DRAPE 40X70 STRL (DRAPES) ×1 IMPLANT
SLEEVE SCD COMPRESS KNEE MED (STOCKING) ×1 IMPLANT
SLING SHLDR PAD UNIV <17 (SOFTGOODS) IMPLANT
SPIKE FLUID TRANSFER (MISCELLANEOUS) IMPLANT
SPONGE T-LAP 18X18 ~~LOC~~+RFID (SPONGE) ×1 IMPLANT
STEM HUMERAL PLUS LONG SZ2 (Orthopedic Implant) IMPLANT
SUT ETHIBOND 2 V 37 (SUTURE) ×1 IMPLANT
SUT ETHIBOND NAB CT1 #1 30IN (SUTURE) ×1 IMPLANT
SUT ETHILON 3 0 PS 1 (SUTURE) IMPLANT
SUT MNCRL AB 4-0 PS2 18 (SUTURE) ×1 IMPLANT
SUT VIC AB 0 CT1 27XBRD ANBCTR (SUTURE) IMPLANT
SUT VIC AB 3-0 SH 27X BRD (SUTURE) ×1 IMPLANT
SUTURE FIBERWR #5 38 CONV NDL (SUTURE) ×2 IMPLANT
TOWEL GREEN STERILE FF (TOWEL DISPOSABLE) ×3 IMPLANT
TUBE SUCTION HIGH CAP CLEAR NV (SUCTIONS) ×1 IMPLANT

## 2024-09-24 NOTE — Anesthesia Preprocedure Evaluation (Addendum)
 Anesthesia Evaluation  Patient identified by MRN, date of birth, ID band Patient awake    Reviewed: Allergy & Precautions, H&P , NPO status , Patient's Chart, lab work & pertinent test results  Airway Mallampati: II  TM Distance: >3 FB Neck ROM: Full    Dental  (+) Dental Advisory Given   Pulmonary sleep apnea , former smoker   Pulmonary exam normal breath sounds clear to auscultation       Cardiovascular hypertension, Pt. on medications negative cardio ROS Normal cardiovascular exam Rhythm:Regular Rate:Normal     Neuro/Psych negative neurological ROS  negative psych ROS   GI/Hepatic Neg liver ROS,GERD  ,,  Endo/Other  negative endocrine ROSdiabetes, Type 2    Renal/GU negative Renal ROS  negative genitourinary   Musculoskeletal  (+) Arthritis , Osteoarthritis,    Abdominal   Peds negative pediatric ROS (+)  Hematology negative hematology ROS (+)   Anesthesia Other Findings   Reproductive/Obstetrics negative OB ROS                              Anesthesia Physical Anesthesia Plan  ASA: 3  Anesthesia Plan: General   Post-op Pain Management: Regional block* and Tylenol  PO (pre-op)*   Induction: Intravenous  PONV Risk Score and Plan: 3 and Ondansetron , Dexamethasone , Midazolam  and Treatment may vary due to age or medical condition  Airway Management Planned: Oral ETT  Additional Equipment:   Intra-op Plan:   Post-operative Plan: Extubation in OR  Informed Consent: I have reviewed the patients History and Physical, chart, labs and discussed the procedure including the risks, benefits and alternatives for the proposed anesthesia with the patient or authorized representative who has indicated his/her understanding and acceptance.     Dental advisory given  Plan Discussed with: CRNA  Anesthesia Plan Comments:         Anesthesia Quick Evaluation

## 2024-09-24 NOTE — Interval H&P Note (Signed)
 All questions answered, patient wants to proceed with procedure. ? ?

## 2024-09-24 NOTE — Anesthesia Postprocedure Evaluation (Signed)
 Anesthesia Post Note  Patient: Linda Grant  Procedure(s) Performed: ARTHROPLASTY, SHOULDER, TOTAL, REVERSE OPEN REDUCTION INTERNAL FIXATION SCAPULA (Right: Shoulder)     Patient location during evaluation: PACU Anesthesia Type: General Level of consciousness: awake and alert Pain management: pain level controlled Vital Signs Assessment: post-procedure vital signs reviewed and stable Respiratory status: spontaneous breathing, nonlabored ventilation and respiratory function stable Cardiovascular status: blood pressure returned to baseline and stable Postop Assessment: no apparent nausea or vomiting Anesthetic complications: no   No notable events documented.  Last Vitals:  Vitals:   09/24/24 1242 09/24/24 1245  BP: (!) 129/59   Pulse: 86   Resp: 15   Temp:    SpO2: 91% 93%    Last Pain:  Vitals:   09/24/24 1245  PainSc: 0-No pain                 Butler Levander Pinal

## 2024-09-24 NOTE — Progress Notes (Signed)
 Assisted Dr. Hyacinth Meeker with right, interscalene , ultrasound guided block. Side rails up, monitors on throughout procedure. See vital signs in flow sheet. Tolerated Procedure well.

## 2024-09-24 NOTE — Transfer of Care (Signed)
 Immediate Anesthesia Transfer of Care Note  Patient: Linda Grant  Procedure(s) Performed: ARTHROPLASTY, SHOULDER, TOTAL, REVERSE OPEN REDUCTION INTERNAL FIXATION SCAPULA (Right: Shoulder)  Patient Location: PACU  Anesthesia Type:GA combined with regional for post-op pain  Level of Consciousness: drowsy  Airway & Oxygen Therapy: Patient Spontanous Breathing and Patient connected to face mask oxygen  Post-op Assessment: Report given to RN and Post -op Vital signs reviewed and stable  Post vital signs: Reviewed and stable  Last Vitals:  Vitals Value Taken Time  BP 142/63 09/24/24 11:47  Temp 36.2 C 09/24/24 11:47  Pulse 91 09/24/24 11:52  Resp 19 09/24/24 11:52  SpO2 99 % 09/24/24 11:52  Vitals shown include unfiled device data.  Last Pain:  Vitals:   09/24/24 0845  PainSc: 0-No pain      Patients Stated Pain Goal: 4 (09/24/24 0845)  Complications: No notable events documented.

## 2024-09-24 NOTE — Anesthesia Procedure Notes (Signed)
 Anesthesia Regional Block: Interscalene brachial plexus block   Pre-Anesthetic Checklist: , timeout performed,  Correct Patient, Correct Site, Correct Laterality,  Correct Procedure, Correct Position, site marked,  Risks and benefits discussed,  Surgical consent,  Pre-op evaluation,  At surgeon's request and post-op pain management  Laterality: Right  Prep: chloraprep       Needles:  Injection technique: Single-shot  Needle Type: Stimiplex     Needle Length: 9cm  Needle Gauge: 21     Additional Needles:   Procedures:,,,, ultrasound used (permanent image in chart),,    Narrative:  Start time: 09/24/2024 9:55 AM End time: 09/24/2024 10:00 AM Injection made incrementally with aspirations every 5 mL.  Performed by: Personally  Anesthesiologist: Cleotilde Butler Dade, MD

## 2024-09-24 NOTE — Discharge Instructions (Addendum)
 Bonner Hair MD, MPH Aleck Stalling, PA-C [facility] [address], [address] [phone] (tel)   [phone] (fax)   POST-OPERATIVE INSTRUCTIONS - TOTAL SHOULDER REPLACEMENT    WOUND CARE You may leave the operative dressing in place until your follow-up appointment. KEEP THE INCISIONS CLEAN AND DRY. There may be a small amount of fluid/bleeding leaking at the surgical site. This is normal after surgery.  If it fills with liquid or blood please call us  immediately to change it for you. Use the provided ice machine or Ice packs as often as possible for the first 3-4 days, then as needed for pain relief.   Keep a layer of cloth or a shirt between your skin and the cooling unit to prevent frost bite as it can get very cold.  SHOWERING: - You may shower on Post-Op Day #2.  - The dressing is water resistant but do not scrub it as it may start to peel up.   - You may remove the sling for showering - Gently pat the area dry.  - Do not soak the shoulder in water.  - Do not go swimming in the pool or ocean until your incision has completely healed (about 4-6 weeks after surgery) - KEEP THE INCISIONS CLEAN AND DRY.  EXERCISES Wear the sling at all times  You may remove the sling for showering, but keep the arm across the chest or in a secondary sling.    Accidental/Purposeful External Rotation and shoulder flexion (reaching behind you) is to be avoided at all costs for the first month. It is ok to come out of your sling if your are sitting and have assistance for eating.   Do not lift anything heavier than 1 pound until we discuss it further in clinic.  It is normal for your fingers/hand to become more swollen after surgery and discolored from bruising.   This will resolve over the first few weeks usually after surgery. Please continue to ambulate and do not stay sitting or lying for too long.  Perform foot and wrist pumps to assist in circulation.  PHYSICAL  THERAPY - No therapy for 4 weeks  REGIONAL ANESTHESIA (NERVE BLOCKS) The anesthesia team may have performed a nerve block for you this is a great tool used to minimize pain.   The block may start wearing off overnight (between 8-24 hours postop) When the block wears off, your pain may go from nearly zero to the pain you would have had postop without the block. This is an abrupt transition but nothing dangerous is happening.   This can be a challenging period but utilize your as needed pain medications to try and manage this period. We suggest you use the pain medication the first night prior to going to bed, to ease this transition.  You may take an extra dose of narcotic when this happens if needed   POST-OP MEDICATIONS- Multimodal approach to pain control In general your pain will be controlled with a combination of substances.  Prescriptions unless otherwise discussed are electronically sent to your pharmacy.  This is a carefully made plan we use to minimize narcotic use.     Celebrex - Anti-inflammatory medication taken on a scheduled basis Acetaminophen  - Non-narcotic pain medicine taken on a scheduled basis  Oxycodone  - This is a strong narcotic, to be used only on an "as needed" basis for SEVERE pain. Aspirin 81mg  - This medicine is used to minimize the risk of blood clots after surgery.  Zofran  -  take as needed for nausea   FOLLOW-UP If you develop a Fever (>101.5), Redness or Drainage from the surgical incision site, please call our office to arrange for an evaluation. Please call the office to schedule a follow-up appointment for a wound check, 7-10 days post-operatively.  IF YOU HAVE ANY QUESTIONS, PLEASE FEEL FREE TO CALL OUR OFFICE.  HELPFUL INFORMATION  Your arm will be in a sling following surgery. You will be in this sling for the next 4 weeks.   You may be more comfortable sleeping in a semi-seated position the first few nights following surgery.  Keep a pillow  propped under the elbow and forearm for comfort.  If you have a recliner type of chair it might be beneficial.  If not that is fine too, but it would be helpful to sleep propped up with pillows behind your operated shoulder as well under your elbow and forearm.  This will reduce pulling on the suture lines.  When dressing, put your operative arm in the sleeve first.  When getting undressed, take your operative arm out last.  Loose fitting, button-down shirts are recommended.  In most states it is against the law to drive while your arm is in a sling. And certainly against the law to drive while taking narcotics.  You may return to work/school in the next couple of days when you feel up to it. Desk work and typing in the sling is fine.  We suggest you use the pain medication the first night prior to going to bed, in order to ease any pain when the anesthesia wears off. You should avoid taking pain medications on an empty stomach as it will make you nauseous.  You should wean off your narcotic medicines as soon as you are able.     Most patients will be off narcotics before their first postop appointment.   Do not drink alcoholic beverages or take illicit drugs when taking pain medications.  Pain medication may make you constipated.  Below are a few solutions to try in this order: Decrease the amount of pain medication if you aren't having pain. Drink lots of decaffeinated fluids. Drink prune juice and/or each dried prunes  If the first 3 don't work start with additional solutions Take Colace - an over-the-counter stool softener Take Senokot - an over-the-counter laxative Take Miralax - a stronger over-the-counter laxative   Dental Antibiotics:  We require dental prophylaxis for 2 years after a shoulder replacement  Contact your surgeon for an antibiotic prescription, prior to your dental procedure.   For more information including helpful videos and documents visit our website:    [email or web address]    Post Anesthesia Home Care Instructions  Activity: Get plenty of rest for the remainder of the day. A responsible individual must stay with you for 24 hours following the procedure.  For the next 24 hours, DO NOT: -Drive a car -Advertising copywriter -Drink alcoholic beverages -Take any medication unless instructed by your physician -Make any legal decisions or sign important papers.  Meals: Start with liquid foods such as gelatin or soup. Progress to regular foods as tolerated. Avoid greasy, spicy, heavy foods. If nausea and/or vomiting occur, drink only clear liquids until the nausea and/or vomiting subsides. Call your physician if vomiting continues.  Special Instructions/Symptoms: Your throat may feel dry or sore from the anesthesia or the breathing tube placed in your throat during surgery. If this causes discomfort, gargle with warm salt water. The discomfort should  disappear within 24 hours.  If you had a scopolamine  patch placed behind your ear for the management of post- operative nausea and/or vomiting:  1. The medication in the patch is effective for 72 hours, after which it should be removed.  Wrap patch in a tissue and discard in the trash. Wash hands thoroughly with soap and water. 2. You may remove the patch earlier than 72 hours if you experience unpleasant side effects which may include dry mouth, dizziness or visual disturbances. 3. Avoid touching the patch. Wash your hands with soap and water after contact with the patch.  No Tylenol  until after 2:49pm today if needed    Regional Anesthesia Blocks  1. You may not be able to move or feel the blocked extremity after a regional anesthetic block. This may last may last from 3-48 hours after placement, but it will go away. The length of time depends on the medication injected and your individual response to the medication. As the nerves start to wake up, you may  experience tingling as the movement and feeling returns to your extremity. If the numbness and inability to move your extremity has not gone away after 48 hours, please call your surgeon.   2. The extremity that is blocked will need to be protected until the numbness is gone and the strength has returned. Because you cannot feel it, you will need to take extra care to avoid injury. Because it may be weak, you may have difficulty moving it or using it. You may not know what position it is in without looking at it while the block is in effect.  3. For blocks in the legs and feet, returning to weight bearing and walking needs to be done carefully. You will need to wait until the numbness is entirely gone and the strength has returned. You should be able to move your leg and foot normally before you try and bear weight or walk. You will need someone to be with you when you first try to ensure you do not fall and possibly risk injury.  4. Bruising and tenderness at the needle site are common side effects and will resolve in a few days.  5. Persistent numbness or new problems with movement should be communicated to the surgeon or the [facility] [phone] [facility] [phone]).

## 2024-09-24 NOTE — Anesthesia Procedure Notes (Signed)
 Procedure Name: Intubation Date/Time: 09/24/2024 10:24 AM  Performed by: Debarah Chiquita LABOR, CRNAPre-anesthesia Checklist: Patient identified, Emergency Drugs available, Suction available and Patient being monitored Patient Re-evaluated:Patient Re-evaluated prior to induction Oxygen Delivery Method: Circle system utilized Preoxygenation: Pre-oxygenation with 100% oxygen Induction Type: IV induction Ventilation: Mask ventilation without difficulty Laryngoscope Size: Mac and 3 Grade View: Grade I Tube type: Oral Tube size: 7.0 mm Number of attempts: 1 Airway Equipment and Method: Stylet and Bite block Placement Confirmation: ETT inserted through vocal cords under direct vision, positive ETCO2 and breath sounds checked- equal and bilateral Secured at: 21 cm Tube secured with: Tape Dental Injury: Teeth and Oropharynx as per pre-operative assessment

## 2024-09-24 NOTE — Op Note (Signed)
 Orthopaedic Surgery Operative Note (CSN: [id number])  Linda Grant  1941-05-02 Date of Surgery: 09/24/2024   Diagnoses:  Closed displaced fracture of glenoid cavity of right scapula, acute shoulder dislocation, rotator cuff failure  Procedure: Right reverse total Shoulder Arthroplasty 23472 Anterior glenoid augmentation with autograft bone block 23460 Open treatment shoulder dislocation 23660    Operative Finding Successful completion of planned procedure.  Patient had extreme bone loss of the anterior glenoid with about 40% total glenoid face that had fractured away and eroded away with time.  The head was anteriorly dislocated upon examination.  The fragment of the anterior glenoid was not appropriate for fixation and off-the-shelf reverse components did not appropriately reconstitute the glenoid without violating the vault.  I was able to perform an anterior bone block augmentation of the glenoid with autograft humeral head and then proceeded with our reverse total shoulder arthroplasty.  There was a fracture of the greater tuberosity noted on initial imaging and we bypassed this with a longstem.  Reasonable fixation overall however we will avoid therapy for the first month and otherwise treat the patient as a standard reverse.  Post-operative plan: The patient will be NWB in sling.  The patient will be will be discharged from PACU if continues to be stable as was plan prior to surgery.  DVT prophylaxis Aspirin 81 mg twice daily for 6 weeks.  Pain control with PRN pain medication preferring oral medicines.  Follow up plan will be scheduled in approximately 7 days for incision check and XR.  Physical therapy to start after 4 weeks.  Implants: Tornier perform humeral size 2 long stem, 0 retentive polyethylene, 36 center glenosphere with a 25+3 baseplate, 45 center screw and 3 peripheral screws.  Arthrex 30 mm cannulated screw in the bone block  Post-Op Diagnosis:  Same Surgeons:Primary: Cristy Bonner DASEN, MD Assistants:Caroline McBane, PA-C Location: [facility] OR ROOM 1 Anesthesia: General with Exparel  Interscalene Antibiotics: Ancef 2g preop, Vancomycin 1000mg  locally Tourniquet time: None Estimated Blood Loss: 100 Complications: None Specimens: None Implants: Implant Name Type Inv. Item Serial No. Manufacturer Lot No. LRB No. Used Action  BASEPLATE GLENOID RSA 3X25 0D - [id number] Shoulder BASEPLATE GLENOID RSA 3X25 0D [id number] [employer]  Right 1 Implanted  HEAD CANN REV SHLD STD 36 - [id number] Shoulder HEAD CANN REV SHLD STD 36 [id number] [employer]  Right 1 Implanted  SCREW CENTRAL THREAD 6.5X45 - [id number] Screw SCREW CENTRAL THREAD 6.5X45  [employer]  Right 1 Implanted  SCREW PERIPHERAL 30 - [id number] Screw SCREW PERIPHERAL 30  [employer]  Right 1 Implanted  SCREW 5.0X18 - [id number] Screw SCREW 5.0X18  [employer]  Right 1 Implanted  SCREW 5.5X14 - [id number] Screw SCREW 5.5X14  [employer]  Right 1 Implanted  cannulated screw Screw   [employer]  Right 1 Implanted  STEM HUMERAL PLUS LONG SZ2 - [id number] Orthopedic Implant STEM HUMERAL PLUS LONG SZ2  [employer]  Right 1 Implanted  CUP HUM SYS INSERT SZ 1/2 36 - [id number] Joint CUP HUM SYS INSERT SZ 1/2 36  [employer]  Right 1 Implanted    Indications for Surgery:   Linda Grant is a 83 y.o. female with proximal humerus and glenoid fracture with dislocation and acute tear of the rotator cuff.  Benefits and risks of operative and nonoperative management were discussed prior to surgery with patient/guardian(s) and informed consent form was completed.  Infection and need for further surgery were discussed as was prosthetic stability and cuff issues.  We additionally specifically discussed risks of axillary nerve injury, infection, periprosthetic fracture, continued pain and longevity of implants prior to beginning procedure.      Procedure:   The patient was identified in the  preoperative holding area where the surgical site was marked. Block placed by anesthesia with exparel .  The patient was taken to the OR where a procedural timeout was called and the above noted anesthesia was induced.  The patient was positioned beachchair on allen table with spider arm positioner.  Preoperative antibiotics were dosed.  The patient's right shoulder was prepped and draped in the usual sterile fashion.  A second preoperative timeout was called.      Standard deltopectoral approach was performed with a #10 blade. We dissected down to the subcutaneous tissues and the cephalic vein was taken laterally with the deltoid. Clavipectoral fascia was incised in line with the incision. Deep retractors were placed. The long of the biceps tendon was identified and there was significant tenosynovitis present.  Tenodesis was performed to the pectoralis tendon with #2 Ethibond. The remaining biceps was followed up into the rotator interval where it was released.   The head was clearly anteriorly dislocated and were able to easily reduce it without issue open.  The subscapularis was taken down in a full thickness layer with capsule along the humeral neck extending inferiorly around the humeral head. We continued releasing the capsule directly off of the osteophytes inferiorly all the way around the corner. This allowed us  to dislocate the humeral head.   There was clear tearing of the anterior superior subscapularis as well as the superior cuff.  The rotator cuff was carefully examined and noted to be irreperably torn.  The decision was confirmed that a reverse total shoulder was indicated for this patient.  There were osteophytes along the inferior humeral neck. The osteophytes were removed with an osteotome and a rongeur.  Osteophytes were removed with a rongeur and an osteotome and the anatomic neck was well visualized.     A humeral cutting guide was inserted down the intramedullary canal. The  version was set at 20 of retroversion. Humeral osteotomy was performed with an oscillating saw. The head fragment was passed off the back table.  Cut protector was placed.   The subscapularis was again identified and immediately we took care to palpate the axillary nerve anteriorly and verify its position with gentle palpation as well as the tug test.  We then released the SGHL with bovie cautery prior to placing a curved mayo at the junction of the anterior glenoid well above the axillary nerve and bluntly dissecting the subscapularis from the capsule.  We then carefully protected the axillary nerve as we gently released the inferior capsule to fully mobilize the subscapularis.  An anterior deltoid retractor was then placed as well as a small Hohmann retractor superiorly.   There was a large amount of the anterior glenoid that was fractured off.  I estimated this between 30 and 40%.  The fragment itself was not amenable to repair as it was quite degenerative.  I was able to visualize the anterior scapular neck and worried that a half wedge implant would change my version to aggressively or require removal of bone.  Instead felt that anterior bone block augmentation of the glenoid was appropriate.  I used the head fragment and fashioned a 10 mm graft by 20 mm in length.  Able to provisionally hold this with K wires and then reamed the native glenoid flat  and use a bur to prepare the graft itself.  At that point I drilled for our center pin and our boss drill for the Adrian system.    The remainder of the vault was intact.  I was able to drill and placed a 45 center screw 6.5 mm.  Baseplate from above was used.  I good compression and good bony seating of the implant.  Once the peripheral screws were placed 1 screw in the anteriormost portion was used to provisionally fixate the graft.  I used a cannulated Arthrex titanium screw and obtained additional fixation on the inferior portion of the graft.  At  that point I cleared peripheral bone and tissue and placed our 36 glenosphere.  We turned our attention back to the humerus.  I was able to expose the humerus and sized for a size 2 long stem to bypass a tuberosity fracture.  Was able to ream and placed this without issue.  We sized for a 0 retentive polyethylene.  #2 FiberWire x 2 was passed in the lesser tuberosity for subscapularis repair.  We then were able to place her final implants and reduced the joint repairing the subscapularis.  We irrigated copiously placed local vancomycin powder and closed the incision in multilayer fashion finishing with nylon suture.     Aleck Stalling, PA-C, present and scrubbed throughout the case, critical for completion in a timely fashion, and for retraction, instrumentation, closure.

## 2024-09-25 ENCOUNTER — Encounter (HOSPITAL_BASED_OUTPATIENT_CLINIC_OR_DEPARTMENT_OTHER): Payer: Self-pay | Admitting: Orthopaedic Surgery

## 2024-10-02 DIAGNOSIS — Z471 Aftercare following joint replacement surgery: Secondary | ICD-10-CM | POA: Diagnosis not present

## 2024-10-02 DIAGNOSIS — Z96611 Presence of right artificial shoulder joint: Secondary | ICD-10-CM | POA: Diagnosis not present

## 2024-10-09 DIAGNOSIS — Z96611 Presence of right artificial shoulder joint: Secondary | ICD-10-CM | POA: Diagnosis not present

## 2024-10-09 DIAGNOSIS — G5621 Lesion of ulnar nerve, right upper limb: Secondary | ICD-10-CM | POA: Diagnosis not present

## 2024-10-23 DIAGNOSIS — Z4789 Encounter for other orthopedic aftercare: Secondary | ICD-10-CM | POA: Diagnosis not present

## 2024-10-23 DIAGNOSIS — Z96611 Presence of right artificial shoulder joint: Secondary | ICD-10-CM | POA: Diagnosis not present

## 2024-10-27 DIAGNOSIS — M6281 Muscle weakness (generalized): Secondary | ICD-10-CM | POA: Diagnosis not present

## 2024-10-27 DIAGNOSIS — M25611 Stiffness of right shoulder, not elsewhere classified: Secondary | ICD-10-CM | POA: Diagnosis not present

## 2024-10-27 DIAGNOSIS — Z96619 Presence of unspecified artificial shoulder joint: Secondary | ICD-10-CM | POA: Diagnosis not present

## 2024-10-27 DIAGNOSIS — M25511 Pain in right shoulder: Secondary | ICD-10-CM | POA: Diagnosis not present

## 2024-11-03 DIAGNOSIS — M25511 Pain in right shoulder: Secondary | ICD-10-CM | POA: Diagnosis not present

## 2024-11-03 DIAGNOSIS — M25611 Stiffness of right shoulder, not elsewhere classified: Secondary | ICD-10-CM | POA: Diagnosis not present

## 2024-11-03 DIAGNOSIS — Z96619 Presence of unspecified artificial shoulder joint: Secondary | ICD-10-CM | POA: Diagnosis not present

## 2024-11-03 DIAGNOSIS — M6281 Muscle weakness (generalized): Secondary | ICD-10-CM | POA: Diagnosis not present

## 2024-11-05 DIAGNOSIS — M25511 Pain in right shoulder: Secondary | ICD-10-CM | POA: Diagnosis not present

## 2024-11-05 DIAGNOSIS — Z96619 Presence of unspecified artificial shoulder joint: Secondary | ICD-10-CM | POA: Diagnosis not present

## 2024-11-05 DIAGNOSIS — M6281 Muscle weakness (generalized): Secondary | ICD-10-CM | POA: Diagnosis not present

## 2024-11-05 DIAGNOSIS — M25611 Stiffness of right shoulder, not elsewhere classified: Secondary | ICD-10-CM | POA: Diagnosis not present

## 2024-12-25 ENCOUNTER — Other Ambulatory Visit

## 2024-12-25 ENCOUNTER — Ambulatory Visit: Admitting: Gastroenterology

## 2024-12-25 ENCOUNTER — Encounter: Payer: Self-pay | Admitting: Gastroenterology

## 2024-12-25 ENCOUNTER — Ambulatory Visit: Payer: Self-pay | Admitting: Gastroenterology

## 2024-12-25 VITALS — BP 104/56 | HR 100 | Ht 59.25 in | Wt 142.4 lb

## 2024-12-25 DIAGNOSIS — D509 Iron deficiency anemia, unspecified: Secondary | ICD-10-CM

## 2024-12-25 DIAGNOSIS — K449 Diaphragmatic hernia without obstruction or gangrene: Secondary | ICD-10-CM

## 2024-12-25 DIAGNOSIS — Z85038 Personal history of other malignant neoplasm of large intestine: Secondary | ICD-10-CM | POA: Diagnosis not present

## 2024-12-25 DIAGNOSIS — R195 Other fecal abnormalities: Secondary | ICD-10-CM

## 2024-12-25 DIAGNOSIS — K219 Gastro-esophageal reflux disease without esophagitis: Secondary | ICD-10-CM

## 2024-12-25 LAB — CBC WITH DIFFERENTIAL/PLATELET
Basophils Absolute: 0 K/uL (ref 0.0–0.1)
Basophils Relative: 0.9 % (ref 0.0–3.0)
Eosinophils Absolute: 0.1 K/uL (ref 0.0–0.7)
Eosinophils Relative: 2.7 % (ref 0.0–5.0)
HCT: 30.4 % — ABNORMAL LOW (ref 36.0–46.0)
Hemoglobin: 9.7 g/dL — ABNORMAL LOW (ref 12.0–15.0)
Lymphocytes Relative: 25.5 % (ref 12.0–46.0)
Lymphs Abs: 1.3 K/uL (ref 0.7–4.0)
MCHC: 31.8 g/dL (ref 30.0–36.0)
MCV: 82.7 fl (ref 78.0–100.0)
Monocytes Absolute: 0.4 K/uL (ref 0.1–1.0)
Monocytes Relative: 8 % (ref 3.0–12.0)
Neutro Abs: 3.2 K/uL (ref 1.4–7.7)
Neutrophils Relative %: 62.9 % (ref 43.0–77.0)
Platelets: 304 K/uL (ref 150.0–400.0)
RBC: 3.68 Mil/uL — ABNORMAL LOW (ref 3.87–5.11)
RDW: 15.2 % (ref 11.5–15.5)
WBC: 5.1 K/uL (ref 4.0–10.5)

## 2024-12-25 LAB — BASIC METABOLIC PANEL WITH GFR
BUN: 26 mg/dL — ABNORMAL HIGH (ref 6–23)
CO2: 22 meq/L (ref 19–32)
Calcium: 9 mg/dL (ref 8.4–10.5)
Chloride: 108 meq/L (ref 96–112)
Creatinine, Ser: 1.17 mg/dL (ref 0.40–1.20)
GFR: 43.75 mL/min — ABNORMAL LOW
Glucose, Bld: 158 mg/dL — ABNORMAL HIGH (ref 70–99)
Potassium: 4.4 meq/L (ref 3.5–5.1)
Sodium: 140 meq/L (ref 135–145)

## 2024-12-25 LAB — IBC + FERRITIN
Ferritin: 6.7 ng/mL — ABNORMAL LOW (ref 10.0–291.0)
Iron: 35 ug/dL — ABNORMAL LOW (ref 42–145)
Saturation Ratios: 7.7 % — ABNORMAL LOW (ref 20.0–50.0)
TIBC: 455 ug/dL — ABNORMAL HIGH (ref 250.0–450.0)
Transferrin: 325 mg/dL (ref 212.0–360.0)

## 2024-12-25 LAB — B12 AND FOLATE PANEL
Folate: 13.6 ng/mL
Vitamin B-12: >1500 pg/mL — ABNORMAL HIGH (ref 211–911)

## 2024-12-25 NOTE — Patient Instructions (Signed)
 You have been scheduled for an endoscopy. Please follow written instructions given to you at your visit today.  If you use inhalers (even only as needed), please bring them with you on the day of your procedure.  If you take any of the following medications, they will need to be adjusted prior to your procedure:   DO NOT TAKE 7 DAYS PRIOR TO TEST- Trulicity (dulaglutide) Ozempic, Wegovy (semaglutide) Mounjaro, Zepbound (tirzepatide) Bydureon Bcise (exanatide extended release)  DO NOT TAKE 1 DAY PRIOR TO YOUR TEST Rybelsus (semaglutide) Adlyxin (lixisenatide) Victoza (liraglutide) Byetta (exanatide) ___________________________________________________________________________  Please go to the lab in the basement of our building to have lab work done as you leave today. Hit B for basement when you get on the elevator.  When the doors open the lab is on your left.  We will call you with the results. Thank you.  CONTINUE OMEPRAZOLE 1 - 2 TIMES DAILY  CALL IF ANY EVIDENCE OF BLEEDING  GO TO THE EMERGENCY ROOM IF YOU HAVE SHORTNESS OF BREATH, CHEST PAIN, WORSENING FATIGUE/WEAKNESS, BLACK TARRY STOOLS OR RECTAL BLEEDING  _______________________________________________________  If your blood pressure at your visit was 140/90 or greater, please contact your primary care physician to follow up on this.  _______________________________________________________  If you are age 82 or older, your body mass index should be between 23-30. Your Body mass index is 28.51 kg/m. If this is out of the aforementioned range listed, please consider follow up with your Primary Care Provider.  If you are age 82 or younger, your body mass index should be between 19-25. Your Body mass index is 28.51 kg/m. If this is out of the aformentioned range listed, please consider follow up with your Primary Care Provider.   ________________________________________________________  The  GI providers would  like to encourage you to use MYCHART to communicate with providers for non-urgent requests or questions.  Due to long hold times on the telephone, sending your provider a message by [facility] may be a faster and more efficient way to get a response.  Please allow 48 business hours for a response.  Please remember that this is for non-urgent requests.  _______________________________________________________  [facility] is using a team-based approach to care.  Your team is made up of your doctor and two to three APPS. Our APPS (Nurse Practitioners and Physician Assistants) work with your physician to ensure care continuity for you. They are fully qualified to address your health concerns and develop a treatment plan. They communicate directly with your gastroenterologist to care for you. Seeing the Advanced Practice Practitioners on your physician's team can help you by facilitating care more promptly, often allowing for earlier appointments, access to diagnostic testing, procedures, and other specialty referrals.   Due to recent changes in healthcare laws, you may see the results of your imaging and laboratory studies on MyChart before your provider has had a chance to review them.  We understand that in some cases there may be results that are confusing or concerning to you. Not all laboratory results come back in the same time frame and the provider may be waiting for multiple results in order to interpret others.  Please give us  48 hours in order for your provider to thoroughly review all the results before contacting the office for clarification of your results.

## 2024-12-25 NOTE — Progress Notes (Signed)
 "  Linda Grant [id number] November 06, 1941   Chief Complaint: Anemia  Referring Provider: Chrystal Lamarr RAMAN, MD Primary GI MD: Dr. Avram  HPI: Linda Grant is a 84 y.o. female with past medical history of colon cancer 2013 s/p partial colectomy, iron deficiency anemia, T2DM, GERD, HTN, HLD, hemorrhoids, sleep apnea who presents today for evaluation of anemia and heme positive stool.    Last seen in our office 09/25/2016 by Dr. Avram for follow-up of Barrett's esophagus.  On PPI at that time with heartburn 2-3 times a month when lying down at night.  EGD 2015 with new diagnosis of Barrett's esophagus, biopsy showed inflammation and glandular atypia, indeterminate for dysplasia.  Underwent repeat EGD 11/01/2016 with no evidence of Barrett's esophagus but did have a 4 cm hiatal hernia.  No recall recommended.  Patient has history of colon cancer s/p partial colectomy.  Last colonoscopy 12/27/2023 with patent colonic anastomosis with healthy-appearing mucosa, no colon polyps, though does have history of adenomatous polyps.  No recall was recommended due to age and absence of polyps on the procedure.  Follows with Margarete PCP. Anemia with heme positive stool (ColoFIT). Hgb 10.5 on 11/18/2024 with MCV 84.4, normal platelet count (Hgb was 11.9 on labs in October). Colonoscopy last year was normal. GFR 45.   Discussed the use of AI scribe software for clinical note transcription with the patient, who gave verbal consent to proceed.  History of Present Illness Linda Grant is an 84 year old female with history of colon cancer status post partial colectomy who presents for evaluation of anemia and positive stool test for blood.  Anemia and Occult Gastrointestinal Blood Loss: - Positive stool test for blood - Hemoglobin declined from 11.9 g/dL several months ago to 89.4 g/dL one month ago - Duration of anemia unclear; low energy for several months - No history of blood  transfusions or iron infusions - Previously donated blood regularly, but stopped after cancer diagnosis due to anemia; experienced near-syncope when attempting to donate post-recovery - No visible blood in stool or melena - No abdominal pain, nausea, vomiting, or unintentional weight loss - Unintentional weight gain; questions if gabapentin  may be contributing - Bowel movements regular - Recently started vitamin B12 supplementation; iron supplementation not yet started pending identification of anemia source  Colorectal Cancer Surveillance: - History of colon cancer treated with partial colectomy - Most recent colonoscopy one year ago reportedly normal with no polyps identified  Gastroesophageal Reflux Disease and Hiatal Hernia: - History of gastroesophageal reflux and hiatal hernia identified on upper endoscopy nearly ten years ago - Previously suspected Barrett's esophagus - Takes omeprazole once daily (previously twice daily) with only rare breakthrough symptoms since dose reduction - Occasional mild dysphagia described as food feeling stuck right here (pointing to chest) resolving with water and present intermittently for years - No regurgitation, abdominal pain, or unintentional weight loss  Chronic Kidney Disease: - Informed that kidney function is in a low range; uncertain about formal diagnosis  Musculoskeletal Limitations: - Reverse shoulder replacement three months ago, impacting activity and energy  Cardiovascular History: - Mitral valve prolapse without symptoms - Not on anticoagulation - No chest pain, shortness of breath, history of myocardial infarction, or stroke  Medication and Substance Use: - No NSAID use; only occasional acetaminophen  - Does not smoke - Alcohol intake less than one drink per week   Previous GI Procedures/Imaging   Colonoscopy 12/27/2023 - Diverticulosis in the sigmoid colon.  - Patent end-to-side ileo-colonic  anastomosis, characterized by  healthy appearing mucosa.  - The examination was otherwise normal on direct and retroflexion views.  - No specimens collected.  - Personal history of colonic polyps. Last were 1/ 22 - 2 adenomas and ssp max 15 mm and HGD in one adenoma. Also has FHx CRCA  - Personal history of malignant neoplasm of the colon. Cecum resected 2013 - No recall due to age and absence of polyps  EGD 11/01/2016 - Z- line irregular, at the gastroesophageal junction. Biopsied.  - 4 cm hiatal hernia.  - The examination was otherwise normal. - No recall Path: Surgical [P], GE junction - HX of Barrett's - BENIGN SQUAMOCOLUMNAR MUCOSA WITH ASSOCIATED SCATTERED CHRONIC INFLAMMATION AND MILD REACTIVE CHANGES. - NO INTESTINAL METAPLASIA, DYSPLASIA, OR MALIGNANCY. - SEE COMMENT.  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   Anemia    Arthritis    rt hip   Cataract    Colon cancer (HCC)    Diabetes mellitus (HCC)    type 2 - controlled   Diverticulosis    Elevated cholesterol    Family history of breast cancer    Family history of colon cancer    Family history of pulmonary fibrosis    GERD (gastroesophageal reflux disease)    Heart murmur    Hepatic lesion    Hx of adenomatous colonic polyps 07/02/2012   Hypertension    Internal hemorrhoids    Mitral valve prolapse    Sleep apnea    mild, uses oral appliance   Ventral hernia    Vitamin D deficiency     Past Surgical History:  Procedure Laterality Date   CATARACT EXTRACTION  07/2017   COLON SURGERY     COLONOSCOPY     DENTAL SURGERY  1995   Dental Implants   INCISIONAL HERNIA REPAIR N/A 08/29/2016   Procedure: HERNIA REPAIR INCISIONAL;  Surgeon: Debby Shipper, MD;  Location: [facility];  Service: General;  Laterality: N/A;  HERNIA REPAIR INCISIONAL   INSERTION OF MESH N/A 08/29/2016   Procedure: INSERTION OF MESH;  Surgeon: Debby Shipper, MD;  Location: [facility];  Service:  General;  Laterality: N/A;  INSERTION OF MESH   MOUTH SURGERY     dental implants   PARTIAL COLECTOMY  07/28/2012   REVERSE SHOULDER ARTHROPLASTY Right 09/24/2024   Procedure: ARTHROPLASTY, SHOULDER, TOTAL, REVERSE OPEN REDUCTION INTERNAL FIXATION SCAPULA;  Surgeon: Cristy Bonner DASEN, MD;  Location: [facility];  Service: Orthopedics;  Laterality: Right;   VAGINAL HYSTERECTOMY      Current Outpatient Medications  Medication Sig Dispense Refill   ACCU-CHEK AVIVA PLUS test strip      ASPIRIN  81 PO Take 1 tablet by mouth daily.     Cholecalciferol (VITAMIN D3) 2000 UNITS TABS Take 2,000 Units by mouth daily.     gabapentin  (NEURONTIN ) 300 MG capsule Take 300 mg by mouth 3 (three) times daily.     glipiZIDE (GLUCOTROL XL) 5 MG 24 hr tablet Take 5 mg by mouth daily with breakfast.     Glucosamine-Chondroit-Vit C-Mn (GLUCOSAMINE CHONDR 1500 COMPLX PO) Take 1 tablet by mouth 2 (two) times daily. 1500/1200     lisinopril -hydrochlorothiazide (ZESTORETIC) 10-12.5 MG tablet Take 1 tablet by mouth daily.     omeprazole (PRILOSEC) 20 MG capsule Take 20 mg by mouth 2 (two) times daily before a meal.      ondansetron  (ZOFRAN ) 4 MG tablet Take 4 mg by  mouth as needed.     simvastatin  (ZOCOR ) 40 MG tablet Take 40 mg by mouth every evening.     SitaGLIPtin-MetFORMIN HCl (JANUMET XR) 50-1000 MG TB24 Take 1 tablet by mouth daily.     No current facility-administered medications for this visit.    Allergies as of 12/25/2024 - Review Complete 12/25/2024  Allergen Reaction Noted   Bee venom Swelling 11/18/2023   Penicillins Anaphylaxis 06/30/2012    Family History  Problem Relation Age of Onset   Lung cancer Sister 67       smoker   Diabetes Brother        deceased   COPD Brother        d. 35   Diabetes Brother 51   Pulmonary fibrosis Brother    Heart disease Father    Pulmonary fibrosis Father        d. 50   Colon cancer Sister 27       w/ mets to pancreas at 40y; negative genetic  testing ([facility] myRisk in 2014)   Pulmonary fibrosis Sister        d. 15   Colon polyps Sister        benign; unspecified number   Breast cancer Maternal Aunt        older than 50y   Uterine cancer Cousin 5       maternal 1st cousin, once-removed   Breast cancer Cousin        maternal 1st cousin   Prostate cancer Cousin        first cousin (he is a maternal and paternal 1st cousin)   Bladder Cancer Cousin        bladder cancer w/ intestinal involvement; dx 18s-50s   Pulmonary fibrosis Paternal Uncle    Colon polyps Other        niece w/ several polyps; dx <50y   Heart attack Maternal Uncle        d. late 50s-early 60s   Heart attack Maternal Grandmother 89   Heart attack Maternal Grandfather    Heart attack Paternal Grandmother        d. 67s-80s   Heart attack Paternal Grandfather        d. late 70s-early 80s   Breast cancer Maternal Aunt        dx late 70s   Diabetes Maternal Aunt    Heart Problems Maternal Aunt    Diabetes Maternal Aunt    Heart Problems Maternal Aunt    Diabetes Maternal Aunt    Heart Problems Maternal Aunt    Heart attack Maternal Uncle    Heart attack Maternal Uncle    Melanoma Paternal Uncle    Leukemia Cousin        paternal 1st cousin, once-removed dx 34s   Pulmonary fibrosis Cousin        3-4 pat first cousins with IPF   Esophageal cancer Neg Hx    Rectal cancer Neg Hx    Stomach cancer Neg Hx     Social History[1]   Review of Systems:    Constitutional: No weight loss, fever, chills Cardiovascular: No chest pain Respiratory: No SOB  Gastrointestinal: See HPI and otherwise negative   Physical Exam:  Vital signs: BP (!) 116/56 (BP Location: Left Arm, Patient Position: Sitting)   Pulse 100   Ht 4' 11.25 (1.505 m) Comment: height measured without shoes  Wt 142 lb 6 oz (64.6 kg)   BMI 28.51 kg/m   Wt Readings from Last  3 Encounters:  12/25/24 142 lb 6 oz (64.6 kg)  09/24/24 132 lb 11.5 oz (60.2 kg)  09/19/24 131 lb (59.4  kg)     Constitutional: Pleasant, well-appearing female in NAD, alert and cooperative Head:  Normocephalic and atraumatic.  Respiratory: Respirations even and unlabored. Lungs clear to auscultation bilaterally.  No wheezes, crackles, or rhonchi.  Cardiovascular:  Regular rate and rhythm. No murmurs. No peripheral edema. Gastrointestinal:  Soft, nondistended, nontender. No rebound or guarding. Normal bowel sounds. No appreciable masses or hepatomegaly. Rectal:  Not performed.  Neurologic:  Alert and oriented x4;  grossly normal neurologically.  Skin:   Dry and intact without significant lesions or rashes. Psychiatric: Oriented to person, place and time. Demonstrates good judgement and reason without abnormal affect or behaviors.   Assessment/Plan:   Assessment & Plan Iron deficiency anemia Heme positive stool Chronic mild anemia with recent hemoglobin decline and positive fecal occult blood.  Patient has not noticed any rectal bleeding or melena.  Has been having regular bowel movements.  Denies abdominal pain.  Reflux under control on omeprazole once daily.  Etiology of anemia unclear, further evaluation for iron deficiency and GI blood loss needed.  - Labs today: CBC, BMP, iron/ferritin, B12/folate - Scheduled upper endoscopy to evaluate for potential upper GI blood loss (had unremarkable colonoscopy one year ago). I thoroughly discussed the procedure with the patient to include nature of the procedure, alternatives, benefits, and risks (including but not limited to bleeding, infection, perforation, anesthesia/cardiac/pulmonary complications). Patient verbalized understanding and gave verbal consent to proceed with procedure.  - Discussed possible repeat colonoscopy if iron deficiency confirmed and no upper GI source found. - Will discuss further with Dr. Avram  History of colon cancer, post-partial colectomy Remote colon cancer post partial colectomy, normal colonoscopy one year ago,  no recurrence or lower GI bleeding. No abdominal pain or weight loss. No immediate need for repeat colonoscopy unless anemia workup is unrevealing or iron deficiency confirmed without upper GI source.  - Colonoscopy 12/27/2023 unremarkable with no polyps and no recall recommended. Consider repeat following EGD if unrevealing for source of GI bleeding.  Gastroesophageal reflux disease  Hiatal hernia Longstanding GERD and large hiatal hernia, well controlled on omeprazole. Rare mild dysphagia which has been unchanged for years, no alarm symptoms.  Last EGD 11/01/2016 showed a 4 cm hiatal hernia, biopsies negative for Barrett's esophagus.  Repeat upper endoscopy indicated to rule out upper GI source of bleeding.  - Continue omeprazole once daily; may increase to twice daily if symptoms worsen. - Scheduled upper endoscopy to evaluate for bleeding sources, including Cameron's erosions, and reassess for Barrett's esophagus.    Camie Furbish, PA-C [facility] 12/25/2024, 8:46 AM  Patient Care Team: Chrystal Lamarr RAMAN, MD as PCP - General (Family Medicine)       [1]  Social History Tobacco Use   Smoking status: Former    Current packs/day: 0.00    Average packs/day: 0.2 packs/day    Types: Cigarettes    Quit date: 07/10/1982    Years since quitting: 42.4   Smokeless tobacco: Never  Vaping Use   Vaping status: Never Used  Substance Use Topics   Alcohol use: Yes    Alcohol/week: 2.0 - 3.0 standard drinks of alcohol    Types: 2 - 3 Glasses of wine per week    Comment: glass of wine w/ dinner -- 2-3x per week   Drug use: No   "

## 2024-12-25 NOTE — Telephone Encounter (Signed)
 Pt returned call to discuss results. Please advise. Thank you

## 2025-01-05 ENCOUNTER — Encounter: Payer: Self-pay | Admitting: *Deleted

## 2025-01-08 ENCOUNTER — Ambulatory Visit: Payer: Self-pay | Admitting: Gastroenterology

## 2025-01-08 ENCOUNTER — Other Ambulatory Visit

## 2025-01-08 DIAGNOSIS — D509 Iron deficiency anemia, unspecified: Secondary | ICD-10-CM

## 2025-01-08 LAB — CBC WITH DIFFERENTIAL/PLATELET
Basophils Absolute: 0 10*3/uL (ref 0.0–0.1)
Basophils Relative: 0.6 % (ref 0.0–3.0)
Eosinophils Absolute: 0.1 10*3/uL (ref 0.0–0.7)
Eosinophils Relative: 2.5 % (ref 0.0–5.0)
HCT: 34.1 % — ABNORMAL LOW (ref 36.0–46.0)
Hemoglobin: 10.9 g/dL — ABNORMAL LOW (ref 12.0–15.0)
Lymphocytes Relative: 26.4 % (ref 12.0–46.0)
Lymphs Abs: 1.6 10*3/uL (ref 0.7–4.0)
MCHC: 31.9 g/dL (ref 30.0–36.0)
MCV: 83.7 fl (ref 78.0–100.0)
Monocytes Absolute: 0.4 10*3/uL (ref 0.1–1.0)
Monocytes Relative: 7 % (ref 3.0–12.0)
Neutro Abs: 3.9 10*3/uL (ref 1.4–7.7)
Neutrophils Relative %: 63.5 % (ref 43.0–77.0)
Platelets: 300 10*3/uL (ref 150.0–400.0)
RBC: 4.08 Mil/uL (ref 3.87–5.11)
RDW: 17.1 % — ABNORMAL HIGH (ref 11.5–15.5)
WBC: 6.1 10*3/uL (ref 4.0–10.5)

## 2025-01-08 LAB — IBC + FERRITIN
Ferritin: 10.4 ng/mL (ref 10.0–291.0)
Iron: 230 ug/dL — ABNORMAL HIGH (ref 42–145)
Saturation Ratios: 54.2 % — ABNORMAL HIGH (ref 20.0–50.0)
TIBC: 424.2 ug/dL (ref 250.0–450.0)
Transferrin: 303 mg/dL (ref 212.0–360.0)

## 2025-01-18 ENCOUNTER — Encounter: Admitting: Internal Medicine
